# Patient Record
Sex: Female | Born: 1950 | Race: White | Hispanic: No | Smoking: Never smoker
Health system: Southern US, Community
[De-identification: ages and names within clinical notes are randomized; demographics above are authoritative.]

## PROBLEM LIST (undated history)

## (undated) DIAGNOSIS — Z8041 Family history of malignant neoplasm of ovary: Secondary | ICD-10-CM

## (undated) DIAGNOSIS — G4733 Obstructive sleep apnea (adult) (pediatric): Secondary | ICD-10-CM

## (undated) DIAGNOSIS — R0602 Shortness of breath: Secondary | ICD-10-CM

## (undated) DIAGNOSIS — K219 Gastro-esophageal reflux disease without esophagitis: Secondary | ICD-10-CM

## (undated) DIAGNOSIS — R1031 Right lower quadrant pain: Secondary | ICD-10-CM

## (undated) DIAGNOSIS — Z803 Family history of malignant neoplasm of breast: Secondary | ICD-10-CM

## (undated) DIAGNOSIS — F419 Anxiety disorder, unspecified: Secondary | ICD-10-CM

## (undated) DIAGNOSIS — R1032 Left lower quadrant pain: Secondary | ICD-10-CM

## (undated) DIAGNOSIS — E88819 Insulin resistance, unspecified: Secondary | ICD-10-CM

## (undated) DIAGNOSIS — R230 Cyanosis: Secondary | ICD-10-CM

## (undated) DIAGNOSIS — M8580 Other specified disorders of bone density and structure, unspecified site: Secondary | ICD-10-CM

## (undated) DIAGNOSIS — E049 Nontoxic goiter, unspecified: Secondary | ICD-10-CM

## (undated) DIAGNOSIS — R87619 Unspecified abnormal cytological findings in specimens from cervix uteri: Secondary | ICD-10-CM

## (undated) DIAGNOSIS — E039 Hypothyroidism, unspecified: Secondary | ICD-10-CM

## (undated) DIAGNOSIS — Z8249 Family history of ischemic heart disease and other diseases of the circulatory system: Secondary | ICD-10-CM

## (undated) DIAGNOSIS — K760 Fatty (change of) liver, not elsewhere classified: Secondary | ICD-10-CM

## (undated) DIAGNOSIS — A6920 Lyme disease, unspecified: Secondary | ICD-10-CM

## (undated) DIAGNOSIS — R600 Localized edema: Secondary | ICD-10-CM

## (undated) DIAGNOSIS — I7300 Raynaud's syndrome without gangrene: Secondary | ICD-10-CM

## (undated) DIAGNOSIS — N8110 Cystocele, unspecified: Secondary | ICD-10-CM

## (undated) DIAGNOSIS — J45909 Unspecified asthma, uncomplicated: Secondary | ICD-10-CM

## (undated) DIAGNOSIS — I10 Essential (primary) hypertension: Secondary | ICD-10-CM

## (undated) DIAGNOSIS — R0609 Other forms of dyspnea: Secondary | ICD-10-CM

## (undated) DIAGNOSIS — Z8052 Family history of malignant neoplasm of bladder: Secondary | ICD-10-CM

## (undated) DIAGNOSIS — F32A Depression, unspecified: Secondary | ICD-10-CM

## (undated) HISTORY — DX: Fatty (change of) liver, not elsewhere classified: K76.0

## (undated) HISTORY — DX: Raynaud's syndrome without gangrene: I73.00

## (undated) HISTORY — PX: GALLBLADDER SURGERY: SHX652

## (undated) HISTORY — DX: Cyanosis: R23.0

## (undated) HISTORY — DX: Cystocele, unspecified: N81.10

## (undated) HISTORY — DX: Unspecified asthma, uncomplicated: J45.909

## (undated) HISTORY — DX: Unspecified abnormal cytological findings in specimens from cervix uteri: R87.619

## (undated) HISTORY — DX: Other forms of dyspnea: R06.09

## (undated) HISTORY — DX: Localized edema: R60.0

## (undated) HISTORY — DX: Other specified disorders of bone density and structure, unspecified site: M85.80

## (undated) HISTORY — DX: Obstructive sleep apnea (adult) (pediatric): G47.33

## (undated) HISTORY — DX: Essential (primary) hypertension: I10

## (undated) HISTORY — PX: COLONOSCOPY: SHX174

## (undated) HISTORY — PX: KNEE SURGERY: SHX244

## (undated) HISTORY — DX: Shortness of breath: R06.02

## (undated) HISTORY — DX: Hereditary hemochromatosis: E83.110

## (undated) HISTORY — DX: Family history of ischemic heart disease and other diseases of the circulatory system: Z82.49

## (undated) HISTORY — DX: Nontoxic goiter, unspecified: E04.9

## (undated) HISTORY — DX: Lyme disease, unspecified: A69.20

## (undated) HISTORY — DX: Insulin resistance, unspecified: E88.819

## (undated) HISTORY — PX: ESOPHAGOGASTRODUODENOSCOPY: SHX1529

## (undated) HISTORY — DX: Right lower quadrant pain: R10.31

## (undated) HISTORY — DX: Family history of malignant neoplasm of breast: Z80.3

## (undated) HISTORY — DX: Family history of malignant neoplasm of bladder: Z80.52

## (undated) HISTORY — PX: CHOLECYSTECTOMY: SHX55

## (undated) HISTORY — PX: BREAST BIOPSY: SHX20

## (undated) HISTORY — DX: Anxiety disorder, unspecified: F41.9

## (undated) HISTORY — DX: Family history of malignant neoplasm of ovary: Z80.41

## (undated) HISTORY — DX: Left lower quadrant pain: R10.32

## (undated) HISTORY — DX: Depression, unspecified: F32.A

## (undated) HISTORY — PX: CERVICAL CONE BIOPSY: SUR198

---

## 1997-03-25 DIAGNOSIS — R1013 Epigastric pain: Secondary | ICD-10-CM | POA: Insufficient documentation

## 1997-03-25 HISTORY — DX: Epigastric pain: R10.13

## 2008-12-30 DIAGNOSIS — I7300 Raynaud's syndrome without gangrene: Secondary | ICD-10-CM | POA: Insufficient documentation

## 2008-12-30 HISTORY — DX: Raynaud's syndrome without gangrene: I73.00

## 2014-02-24 DIAGNOSIS — N959 Unspecified menopausal and perimenopausal disorder: Secondary | ICD-10-CM | POA: Insufficient documentation

## 2014-02-24 DIAGNOSIS — E039 Hypothyroidism, unspecified: Secondary | ICD-10-CM | POA: Insufficient documentation

## 2014-02-24 DIAGNOSIS — F341 Dysthymic disorder: Secondary | ICD-10-CM | POA: Insufficient documentation

## 2014-02-24 DIAGNOSIS — J302 Other seasonal allergic rhinitis: Secondary | ICD-10-CM

## 2014-02-24 HISTORY — DX: Other seasonal allergic rhinitis: J30.2

## 2014-09-29 DIAGNOSIS — M129 Arthropathy, unspecified: Secondary | ICD-10-CM

## 2014-09-29 HISTORY — DX: Arthropathy, unspecified: M12.9

## 2015-04-12 DIAGNOSIS — R928 Other abnormal and inconclusive findings on diagnostic imaging of breast: Secondary | ICD-10-CM | POA: Insufficient documentation

## 2015-04-12 HISTORY — DX: Other abnormal and inconclusive findings on diagnostic imaging of breast: R92.8

## 2015-08-30 DIAGNOSIS — K760 Fatty (change of) liver, not elsewhere classified: Secondary | ICD-10-CM

## 2015-08-30 HISTORY — DX: Fatty (change of) liver, not elsewhere classified: K76.0

## 2015-08-31 DIAGNOSIS — J4540 Moderate persistent asthma, uncomplicated: Secondary | ICD-10-CM

## 2015-08-31 HISTORY — DX: Moderate persistent asthma, uncomplicated: J45.40

## 2016-04-26 DIAGNOSIS — I10 Essential (primary) hypertension: Secondary | ICD-10-CM

## 2016-04-26 DIAGNOSIS — N135 Crossing vessel and stricture of ureter without hydronephrosis: Secondary | ICD-10-CM

## 2016-04-26 HISTORY — DX: Crossing vessel and stricture of ureter without hydronephrosis: N13.5

## 2016-04-26 HISTORY — DX: Essential (primary) hypertension: I10

## 2016-09-06 LAB — PULMONARY FUNCTION TEST
DLCO: 6.46 ml/mmHg sec
FEV1/FVC: 72 %
FEV1: 2.6 L
FVC: 3.63 L
TLC: 1.74

## 2016-12-04 DIAGNOSIS — K219 Gastro-esophageal reflux disease without esophagitis: Secondary | ICD-10-CM

## 2016-12-04 HISTORY — DX: Gastro-esophageal reflux disease without esophagitis: K21.9

## 2017-03-05 DIAGNOSIS — F411 Generalized anxiety disorder: Secondary | ICD-10-CM | POA: Insufficient documentation

## 2017-03-05 HISTORY — DX: Generalized anxiety disorder: F41.1

## 2017-09-11 DIAGNOSIS — M8580 Other specified disorders of bone density and structure, unspecified site: Secondary | ICD-10-CM | POA: Insufficient documentation

## 2018-01-02 HISTORY — PX: HYSTEROTOMY: SHX1776

## 2018-01-02 HISTORY — PX: LAPAROSCOPIC HYSTERECTOMY: SHX1926

## 2018-09-13 ENCOUNTER — Other Ambulatory Visit: Payer: Self-pay

## 2018-09-16 ENCOUNTER — Other Ambulatory Visit: Payer: Self-pay

## 2018-09-16 ENCOUNTER — Ambulatory Visit: Payer: Medicare PPO | Admitting: Obstetrics & Gynecology

## 2018-09-16 ENCOUNTER — Encounter: Payer: Self-pay | Admitting: Obstetrics & Gynecology

## 2018-09-16 VITALS — BP 132/90 | Ht 67.0 in | Wt 158.8 lb

## 2018-09-16 DIAGNOSIS — Z780 Asymptomatic menopausal state: Secondary | ICD-10-CM

## 2018-09-16 DIAGNOSIS — Z90710 Acquired absence of both cervix and uterus: Secondary | ICD-10-CM

## 2018-09-16 DIAGNOSIS — N6320 Unspecified lump in the left breast, unspecified quadrant: Secondary | ICD-10-CM

## 2018-09-16 DIAGNOSIS — Z01419 Encounter for gynecological examination (general) (routine) without abnormal findings: Secondary | ICD-10-CM | POA: Diagnosis not present

## 2018-09-16 DIAGNOSIS — M8589 Other specified disorders of bone density and structure, multiple sites: Secondary | ICD-10-CM | POA: Diagnosis not present

## 2018-09-16 DIAGNOSIS — N8110 Cystocele, unspecified: Secondary | ICD-10-CM | POA: Diagnosis not present

## 2018-09-16 DIAGNOSIS — Z23 Encounter for immunization: Secondary | ICD-10-CM

## 2018-09-16 NOTE — Progress Notes (Signed)
Yolanda Vargas 1950/12/31 [id number]   History:    68 y.o. G3P3L3  RP:  New  patient presenting for annual gyn exam   HPI: STATUS post total hysterectomy.  Menopause, well on no hormone replacement therapy.  No pelvic pain.  Currently abstinent.  Urine and bowel movements normal.  Feels a bulge in the vagina when standing and walking.  No vaginal pain.  Breasts normal.  Body mass index normal at 24.87.  Health labs with family physician.  Past medical history,surgical history, family history and social history were all reviewed and documented in the EPIC chart.  Gynecologic History No LMP recorded. Patient has had a hysterectomy. Contraception: status post hysterectomy Last Pap: 09/2017. Results were: normal per patient Last mammogram: 09/2017. Results were: Normal Bone Density: 09/2017 Osteopenia  Colonoscopy: 5 yrs ago normal, 10 yr schedule  Obstetric History OB History  Gravida Para Term Preterm AB Living  3 3 3     3   SAB TAB Ectopic Multiple Live Births               # Outcome Date GA Lbr Len/2nd Weight Sex Delivery Anes PTL Lv  3 Term           2 Term           1 Term              ROS: A ROS was performed and pertinent positives and negatives are included in the history.  GENERAL: No fevers or chills. HEENT: No change in vision, no earache, sore throat or sinus congestion. NECK: No pain or stiffness. CARDIOVASCULAR: No chest pain or pressure. No palpitations. PULMONARY: No shortness of breath, cough or wheeze. GASTROINTESTINAL: No abdominal pain, nausea, vomiting or diarrhea, melena or bright red blood per rectum. GENITOURINARY: No urinary frequency, urgency, hesitancy or dysuria. MUSCULOSKELETAL: No joint or muscle pain, no back pain, no recent trauma. DERMATOLOGIC: No rash, no itching, no lesions. ENDOCRINE: No polyuria, polydipsia, no heat or cold intolerance. No recent change in weight. HEMATOLOGICAL: No anemia or easy bruising or bleeding. NEUROLOGIC: No headache,  seizures, numbness, tingling or weakness. PSYCHIATRIC: No depression, no loss of interest in normal activity or change in sleep pattern.     Exam:   BP 132/90    Ht 5\' 7"  (1.702 m)    Wt 158 lb 12.8 oz (72 kg)    BMI 24.87 kg/m   Body mass index is 24.87 kg/m.  General appearance : Well developed well nourished female. No acute distress HEENT: Eyes: no retinal hemorrhage or exudates,  Neck supple, trachea midline, no carotid bruits, no thyroidmegaly Lungs: Clear to auscultation, no rhonchi or wheezes, or rib retractions  Heart: Regular rate and rhythm, no murmurs or gallops Breast:Examined in sitting and supine position were symmetrical in appearance.  Right breast: no palpable masses or tenderness,  no skin retraction, no nipple inversion, no nipple discharge, no skin discoloration, no right axillary or supraclavicular lymphadenopathy.  Left breast:  Palpable nodule at 7-8 O'Clock close to the nipple, 1 x 2 cm, soft, mobile, mildly tender, no skin retraction, no nipple inversion, no nipple discharge, no skin discoloration, no left axillary or supraclavicular lymphadenopathy.   Abdomen: no palpable masses or tenderness, no rebound or guarding Extremities: no edema or skin discoloration or tenderness  Pelvic: Vulva: Normal             Vagina: No gross lesions or discharge.  Cystocele grade 3/4 with Valsalva.  Cervix/Uterus absent  Adnexa  Without masses or tenderness  Anus: Normal   Assessment/Plan:  68 y.o. female for annual exam   1. Well female exam with routine gynecological exam Gynecologic exam status post total hysterectomy and menopause.  Pap test September 2019 was normal per patient, no indication to repeat this year.  Breast exam with a left breast nodule.  Will schedule a left diagnostic mammogram and ultrasound.  Right screening mammogram at the same time.  Health labs with family physician.  Good body mass index at 24.87.  Continue with healthy nutrition and fitness.  2.  S/P total hysterectomy  3. Postmenopause Well on no hormone replacement therapy.  4. Osteopenia of multiple sites Last bone density September 2019 showed osteopenia.  Continue with vitamin D supplements, calcium intake of 1200 mg daily and regular weightbearing physical activities.  5. Left breast lump Left breast tender nodule at 7-8 O'Clock, near the nipple. 1 x 2 cm, soft, mobile.  Will refer for a Left Dx Mammo/US.   6. Baden-Walker grade 3 cystocele Cystocele grade 3/4.  Precautions reviewed with patient to minimize symptoms and avoid progression, especially avoiding excessive pressure by emptying her bladder before it is too full, avoiding pushing for bowel movements, treating cough, and avoiding heavy lifting.  Pessary management and surgery reviewed.  Patient will observe at this time.  We will follow-up for pessary fitting if symptoms progress.  7. Need for immunization against influenza - Flu Vaccine QUAD 36+ mos IM  Other orders - amLODipine (NORVASC) 5 MG tablet - SYMBICORT 160-4.5 MCG/ACT inhaler - escitalopram (LEXAPRO) 10 MG tablet - meloxicam (MOBIC) 15 MG tablet - montelukast (SINGULAIR) 10 MG tablet - rosuvastatin (CRESTOR) 10 MG tablet  Counseling on above issues and coordination of care more than 50% for 30 minutes.  Genia DelMarie-Lyne Sydnei Ohaver MD, 3:35 PM 09/16/2018

## 2018-09-18 ENCOUNTER — Telehealth: Payer: Self-pay | Admitting: *Deleted

## 2018-09-18 DIAGNOSIS — N6320 Unspecified lump in the left breast, unspecified quadrant: Secondary | ICD-10-CM

## 2018-09-18 NOTE — Telephone Encounter (Signed)
Orders placed at [facility] was told that patient will need to have films transferred to breast center before they can schedule imaging. I advised patient she can to on breast center website and fill out medical release form for imaging to be release from previous out of state imaging center. Patient verbalized she understood.

## 2018-09-18 NOTE — Telephone Encounter (Signed)
-----   Message from Princess Bruins, MD sent at 09/16/2018  4:01 PM EDT ----- Regarding: Refer to the [place] Left breast tender nodule at 7-8 O'Clock, near the nipple.  1 x 2 cm, soft, mobile.  Left Dx mammo/US and Right Screening mammo.

## 2018-09-22 ENCOUNTER — Encounter: Payer: Self-pay | Admitting: Obstetrics & Gynecology

## 2018-09-22 NOTE — Patient Instructions (Signed)
1. Well female exam with routine gynecological exam Gynecologic exam status post total hysterectomy and menopause.  Pap test September 2019 was normal per patient, no indication to repeat this year.  Breast exam with a left breast nodule.  Will schedule a left diagnostic mammogram and ultrasound.  Right screening mammogram at the same time.  Health labs with family physician.  Good body mass index at 24.87.  Continue with healthy nutrition and fitness.  2. S/P total hysterectomy  3. Postmenopause Well on no hormone replacement therapy.  4. Osteopenia of multiple sites Last bone density September 2019 showed osteopenia.  Continue with vitamin D supplements, calcium intake of 1200 mg daily and regular weightbearing physical activities.  5. Left breast lump Left breast tender nodule at 7-8 O'Clock, near the nipple. 1 x 2 cm, soft, mobile.  Will refer for a Left Dx Mammo/US.   6. Baden-Walker grade 3 cystocele Cystocele grade 3/4.  Precautions reviewed with patient to minimize symptoms and avoid progression, especially avoiding excessive pressure by emptying her bladder before it is too full, avoiding pushing for bowel movements, treating cough, and avoiding heavy lifting.  Pessary management and surgery reviewed.  Patient will observe at this time.  We will follow-up for pessary fitting if symptoms progress.  7. Need for immunization against influenza - Flu Vaccine QUAD 36+ mos IM  Desiraye, it was a pleasure meeting you today!

## 2018-09-26 NOTE — Telephone Encounter (Signed)
Patient scheduled on 09/30/18 @ 9:40am

## 2018-09-30 ENCOUNTER — Other Ambulatory Visit: Payer: Self-pay

## 2018-09-30 ENCOUNTER — Ambulatory Visit
Admission: RE | Admit: 2018-09-30 | Discharge: 2018-09-30 | Disposition: A | Discharge status: Home / Self Care | Payer: Medicare PPO | Source: Ambulatory Visit | Attending: Obstetrics & Gynecology | Admitting: Obstetrics & Gynecology

## 2018-09-30 DIAGNOSIS — N6320 Unspecified lump in the left breast, unspecified quadrant: Secondary | ICD-10-CM

## 2018-10-08 ENCOUNTER — Ambulatory Visit: Payer: Medicare PPO | Admitting: Obstetrics & Gynecology

## 2019-07-08 ENCOUNTER — Ambulatory Visit: Payer: Medicare PPO | Admitting: Orthopaedic Surgery

## 2019-07-25 ENCOUNTER — Ambulatory Visit: Payer: Medicare PPO | Admitting: Orthopaedic Surgery

## 2019-08-01 ENCOUNTER — Institutional Professional Consult (permissible substitution): Payer: Medicare PPO | Admitting: Emergency Medicine

## 2019-08-26 ENCOUNTER — Encounter: Payer: Self-pay | Admitting: Orthopaedic Surgery

## 2019-08-26 ENCOUNTER — Ambulatory Visit (INDEPENDENT_AMBULATORY_CARE_PROVIDER_SITE_OTHER): Payer: Medicare PPO

## 2019-08-26 ENCOUNTER — Ambulatory Visit: Payer: Medicare PPO | Admitting: Orthopaedic Surgery

## 2019-08-26 VITALS — Ht 67.5 in | Wt 155.6 lb

## 2019-08-26 DIAGNOSIS — M25552 Pain in left hip: Secondary | ICD-10-CM | POA: Diagnosis not present

## 2019-08-26 NOTE — Progress Notes (Signed)
Office Visit Note   Patient: Yolanda Vargas           Date of Birth: 10/08/1950           MRN: [id number] Visit Date: 08/26/2019              Requested by: Wilfrid Lund, PA [address] [place],  [place] [address] PCP: Patient, No Pcp Per   Assessment & Plan: Visit Diagnoses:  1. Pain in left hip     Plan: Impression is chronic left hip/left lower extremity radiculopathy which is intermittent in nature.  We have discussed possible treatment options.  For now, she will continue with yoga and start lumbar exercises.  I believe that if she does have a flareup she would benefit from a cortisone injection to the left hip joint.  She will follow up with [place] if her symptoms flareup and become persistent.  Follow-Up Instructions: Return if symptoms worsen or fail to improve.   Orders:  Orders Placed This Encounter  Procedures  . XR Pelvis 1-2 Views  . XR Lumbar Spine 2-3 Views   No orders of the defined types were placed in this encounter.     Procedures: No procedures performed   Clinical Data: No additional findings.   Subjective: Chief Complaint  Patient presents with  . Left Hip - Pain  . Lower Back - Pain    HPI patient is a very pleasant and active 69 year old female who comes in today for evaluation of chronic low back/left hip pain.  This is been ongoing for several years.  She notes the pain is deep within her left hip but is unable to pinpoint whether this is coming from the groin or buttocks area.  She does note a remote history of what sounds like cortisone injections to the trochanteric bursa.  The pain she has is primarily with walking as well as occasionally when she is driving for which she gets a twinge.  She denies any radiating pain down the leg, however she does have a history of sciatica about a year ago.  This has resolved.  No numbness, tingling or burning.  No bowel or bladder change and no saddle paresthesias.  Review of Systems as detailed in  HPI.  All others reviewed and are negative.   Objective: Vital Signs: Ht 5' 7.5" (1.715 m)   Wt 155 lb 9.6 oz (70.6 kg)   BMI 24.01 kg/m   Physical Exam well-developed well-nourished female no acute distress.  Alert and oriented x3.  Ortho Exam examination of her left hip reveals a positive logroll and positive FADIR.  Minimally positive straight leg raise.  No focal weakness.  She is neurovascular intact distally.  Specialty Comments:  No specialty comments available.  Imaging: XR Lumbar Spine 2-3 Views  Result Date: 08/26/2019 Marked degenerative changes L5-S1  XR Pelvis 1-2 Views  Result Date: 08/26/2019 Mild to moderate narrowing left hip    PMFS History: There are no problems to display for this patient.  Past Medical History:  Diagnosis Date  . Asthma   . Hypertension   . Thyroid goiter     Family History  Problem Relation Age of Onset  . Breast cancer Mother 21  . Diabetes Father   . Cancer Sister        OVARIAN  . Heart disease Brother     Past Surgical History:  Procedure Laterality Date  . BREAST BIOPSY Left   . GALLBLADDER SURGERY    .  HYSTEROTOMY  2020   Social History   Occupational History  . Not on file  Tobacco Use  . Smoking status: Never Smoker  . Smokeless tobacco: Never Used  Vaping Use  . Vaping Use: Never used  Substance and Sexual Activity  . Alcohol use: Not Currently  . Drug use: Never  . Sexual activity: Not Currently    Partners: Male    Comment: first sexual encounter at 76 one partner

## 2019-09-17 ENCOUNTER — Institutional Professional Consult (permissible substitution): Payer: Medicare PPO | Admitting: Emergency Medicine

## 2019-10-07 ENCOUNTER — Ambulatory Visit (INDEPENDENT_AMBULATORY_CARE_PROVIDER_SITE_OTHER): Payer: Medicare PPO

## 2019-10-07 ENCOUNTER — Ambulatory Visit: Payer: Medicare PPO | Admitting: Pulmonary Disease

## 2019-10-07 ENCOUNTER — Encounter: Payer: Self-pay | Admitting: Pulmonary Disease

## 2019-10-07 ENCOUNTER — Other Ambulatory Visit: Payer: Self-pay

## 2019-10-07 VITALS — BP 124/64 | HR 67 | Temp 96.7°F | Ht 67.0 in | Wt 154.0 lb

## 2019-10-07 DIAGNOSIS — R0602 Shortness of breath: Secondary | ICD-10-CM

## 2019-10-07 DIAGNOSIS — J4540 Moderate persistent asthma, uncomplicated: Secondary | ICD-10-CM | POA: Diagnosis not present

## 2019-10-07 LAB — CBC WITH DIFFERENTIAL/PLATELET
Basophils Absolute: 0.1 10*3/uL (ref 0.0–0.1)
Basophils Relative: 1 % (ref 0.0–3.0)
Eosinophils Absolute: 0.3 10*3/uL (ref 0.0–0.7)
Eosinophils Relative: 4.4 % (ref 0.0–5.0)
HCT: 44.2 % (ref 36.0–46.0)
Hemoglobin: 14.8 g/dL (ref 12.0–15.0)
Lymphocytes Relative: 24.5 % (ref 12.0–46.0)
Lymphs Abs: 1.7 10*3/uL (ref 0.7–4.0)
MCHC: 33.5 g/dL (ref 30.0–36.0)
MCV: 100.8 fl — ABNORMAL HIGH (ref 78.0–100.0)
Monocytes Absolute: 0.5 10*3/uL (ref 0.1–1.0)
Monocytes Relative: 7.4 % (ref 3.0–12.0)
Neutro Abs: 4.3 10*3/uL (ref 1.4–7.7)
Neutrophils Relative %: 62.7 % (ref 43.0–77.0)
Platelets: 202 10*3/uL (ref 150.0–400.0)
RBC: 4.38 Mil/uL (ref 3.87–5.11)
RDW: 12.9 % (ref 11.5–15.5)
WBC: 6.9 10*3/uL (ref 4.0–10.5)

## 2019-10-07 NOTE — Patient Instructions (Signed)
We will get records from her previous pulmonologist Dr Regenia Skeeter, [facility] Check CBC with differential, IgE Chest x-ray today, schedule pulmonary function test Continue current inhaler medication and Singulair and Allegra  Follow-up in 2 to 3 months.

## 2019-10-07 NOTE — Progress Notes (Addendum)
Yolanda Vargas    [id number]    Nov 29, 1950  Primary Care Physician:Becker, Ann Maki, PA  Referring Physician: Wilfrid Lund, PA [address] [place],  [place] [address]  Chief complaint: Consult for asthma  HPI: 69 year old with history of allergies, asthma, hyperlipidemia, hypertension Here to establish care for management of asthma  Previously followed by Dr Regenia Skeeter, [facility] She was diagnosed with asthma around 2012 after a methacholine challenge test.  Maintained on Symbicort, Singulair, Allegra and Flonase nasal spray Still has some persistent symptoms but says that she has learned to live with it Does not use her rescue inhaler regularly  Pets: Has a dog Occupation: Retired Engineer, site and principal Exposures: No mold, hot tub, Jacuzzi.  No feather pillows or comforters Smoking history: Never smoker Travel history: Recently moved from [place] Relevant family history: No significant family history of lung disease   Outpatient Encounter Medications as of 10/07/2019  Medication Sig  . amLODipine (NORVASC) 5 MG tablet   . escitalopram (LEXAPRO) 10 MG tablet   . meloxicam (MOBIC) 15 MG tablet As needed  . montelukast (SINGULAIR) 10 MG tablet   . rosuvastatin (CRESTOR) 10 MG tablet   . SYMBICORT 160-4.5 MCG/ACT inhaler    No facility-administered encounter medications on file as of 10/07/2019.    Allergies as of 10/07/2019 - Review Complete 10/07/2019  Allergen Reaction Noted  . Diltiazem hcl Other (See Comments) 11/26/2018  . Penicillins Rash 11/26/2018  . Cardizem [diltiazem]  08/26/2019  . Doxycycline Rash 11/26/2018  . Hydrocodone Palpitations 11/26/2018  . Lisinopril Cough and Other (See Comments) 11/26/2018    Past Medical History:  Diagnosis Date  . Asthma   . Hypertension   . Thyroid goiter     Past Surgical History:  Procedure Laterality Date  . BREAST BIOPSY Left   . GALLBLADDER SURGERY    .  HYSTEROTOMY  2020    Family History  Problem Relation Age of Onset  . Breast cancer Mother 69  . Diabetes Father   . Cancer Sister        OVARIAN  . Heart disease Brother     Social History   Socioeconomic History  . Marital status: Divorced    Spouse name: Not on file  . Number of children: Not on file  . Years of education: Not on file  . Highest education level: Not on file  Occupational History  . Not on file  Tobacco Use  . Smoking status: Never Smoker  . Smokeless tobacco: Never Used  Vaping Use  . Vaping Use: Never used  Substance and Sexual Activity  . Alcohol use: Not Currently  . Drug use: Never  . Sexual activity: Not Currently    Partners: Male    Comment: first sexual encounter at 62 one partner  Other Topics Concern  . Not on file  Social History Narrative  . Not on file   Social Determinants of Health   Financial Resource Strain:   . Difficulty of Paying Living Expenses: Not on file  Food Insecurity:   . Worried About Programme researcher, broadcasting/film/video in the Last Year: Not on file  . Ran Out of Food in the Last Year: Not on file  Transportation Needs:   . Lack of Transportation (Medical): Not on file  . Lack of Transportation (Non-Medical): Not on file  Physical Activity:   . Days of Exercise per Week: Not on  file  . Minutes of Exercise per Session: Not on file  Stress:   . Feeling of Stress : Not on file  Social Connections:   . Frequency of Communication with Friends and Family: Not on file  . Frequency of Social Gatherings with Friends and Family: Not on file  . Attends Religious Services: Not on file  . Active Member of Clubs or Organizations: Not on file  . Attends Banker Meetings: Not on file  . Marital Status: Not on file  Intimate Partner Violence:   . Fear of Current or Ex-Partner: Not on file  . Emotionally Abused: Not on file  . Physically Abused: Not on file  . Sexually Abused: Not on file    Review of systems: Review of  Systems  Constitutional: Negative for fever and chills.  HENT: Negative.   Eyes: Negative for blurred vision.  Respiratory: as per HPI  Cardiovascular: Negative for chest pain and palpitations.  Gastrointestinal: Negative for vomiting, diarrhea, blood per rectum. Genitourinary: Negative for dysuria, urgency, frequency and hematuria.  Musculoskeletal: Negative for myalgias, back pain and joint pain.  Skin: Negative for itching and rash.  Neurological: Negative for dizziness, tremors, focal weakness, seizures and loss of consciousness.  Endo/Heme/Allergies: Negative for environmental allergies.  Psychiatric/Behavioral: Negative for depression, suicidal ideas and hallucinations.  All other systems reviewed and are negative.  Physical Exam: Blood pressure 124/64, pulse 67, temperature (!) 96.7 F (35.9 C), temperature source Skin, height 5\' 7"  (1.702 m), weight 154 lb (69.9 kg), SpO2 99 %. Gen:      No acute distress HEENT:  EOMI, sclera anicteric Neck:     No masses; no thyromegaly Lungs:    Clear to auscultation bilaterally; normal respiratory effort CV:         Regular rate and rhythm; no murmurs Abd:      + bowel sounds; soft, non-tender; no palpable masses, no distension Ext:    No edema; adequate peripheral perfusion Skin:      Warm and dry; no rash Neuro: alert and oriented x 3 Psych: normal mood and affect  Data Reviewed: Imaging:  PFTs:  ACT score 10/07/2019-16  Labs: CBC 06/20/2019-WBC 6.1, eos 2.3%, absolute eosinophil count 140  Assessment:  Asthma Check baseline CBC, IgE, chest x-ray and PFTs as we do not have these on record Continue current regimen with Symbicort, Singulair and Allegra, Flonase  May need escalation to biologics based on review of above tests.  Plan/Recommendations: CBC, IgE, chest x-ray, PFTs  06/22/2019 MD [facility] 10/07/2019, 11:06 AM  CC: 12/07/2019, PA  Addendum: Received note from Dr. Wilfrid Lund,  [facility] visit dated September 10, 2017 Patient has severe persistent asthma maintained on ProAir, Singulair and Symbicort 160/4.5  PFTs 09/06/2016 FVC 3.63 [102%], FEV1 2.60 [95%], F/F 72, TLC 5.21 [91%], DLCO 24.92 [73%] Minimal obstruction with bronchodilator response, mild reduction in diffusion capacity.  Normal lung volume.  11/06/2016 MD [facility] Please see [email or web address] for pager details.  10/29/2019, 3:46 PM

## 2019-10-08 ENCOUNTER — Telehealth: Payer: Self-pay | Admitting: Pulmonary Disease

## 2019-10-08 ENCOUNTER — Encounter: Payer: Self-pay | Admitting: Pulmonary Disease

## 2019-10-08 LAB — IGE: IgE (Immunoglobulin E), Serum: 24 kU/L (ref <=114)

## 2019-10-08 NOTE — Telephone Encounter (Signed)
Called and spoke with patient and confirmed medication that needs to be adde to her med list and dosage. It has been added to her med list. She also states that when she goes into her mychart to look at the images of her x-ray it is taking her to Medline- pediatric leukemia website. Advised her to see if there was a customer service number or IT number to see if they can assist her. Nothing further needed at this time.

## 2019-10-16 ENCOUNTER — Encounter: Payer: Medicare PPO | Admitting: Obstetrics & Gynecology

## 2019-11-11 ENCOUNTER — Other Ambulatory Visit: Payer: Self-pay | Admitting: Obstetrics & Gynecology

## 2019-11-11 DIAGNOSIS — Z1231 Encounter for screening mammogram for malignant neoplasm of breast: Secondary | ICD-10-CM

## 2019-11-20 ENCOUNTER — Encounter: Payer: Medicare PPO | Admitting: Obstetrics & Gynecology

## 2019-12-03 ENCOUNTER — Ambulatory Visit: Payer: Medicare PPO | Admitting: Pulmonary Disease

## 2019-12-08 ENCOUNTER — Ambulatory Visit (INDEPENDENT_AMBULATORY_CARE_PROVIDER_SITE_OTHER): Payer: Medicare PPO | Admitting: Pulmonary Disease

## 2019-12-08 ENCOUNTER — Encounter: Payer: Self-pay | Admitting: Pulmonary Disease

## 2019-12-08 ENCOUNTER — Other Ambulatory Visit: Payer: Self-pay

## 2019-12-08 VITALS — BP 130/70 | HR 74 | Temp 97.7°F | Ht 67.5 in | Wt 158.6 lb

## 2019-12-08 DIAGNOSIS — R0602 Shortness of breath: Secondary | ICD-10-CM

## 2019-12-08 DIAGNOSIS — J4550 Severe persistent asthma, uncomplicated: Secondary | ICD-10-CM

## 2019-12-08 LAB — PULMONARY FUNCTION TEST
DL/VA % pred: 101 %
DL/VA: 4.11 ml/min/mmHg/L
DLCO cor % pred: 103 %
DLCO cor: 22.75 ml/min/mmHg
DLCO unc % pred: 103 %
DLCO unc: 22.75 ml/min/mmHg
FEF 25-75 Post: 2.31 L/sec
FEF 25-75 Pre: 1.97 L/sec
FEF2575-%Change-Post: 17 %
FEF2575-%Pred-Post: 107 %
FEF2575-%Pred-Pre: 91 %
FEV1-%Change-Post: 3 %
FEV1-%Pred-Post: 100 %
FEV1-%Pred-Pre: 97 %
FEV1-Post: 2.66 L
FEV1-Pre: 2.56 L
FEV1FVC-%Change-Post: 1 %
FEV1FVC-%Pred-Pre: 98 %
FEV6-%Change-Post: 2 %
FEV6-%Pred-Post: 104 %
FEV6-%Pred-Pre: 101 %
FEV6-Post: 3.48 L
FEV6-Pre: 3.38 L
FEV6FVC-%Change-Post: 0 %
FEV6FVC-%Pred-Post: 104 %
FEV6FVC-%Pred-Pre: 103 %
FVC-%Change-Post: 2 %
FVC-%Pred-Post: 100 %
FVC-%Pred-Pre: 98 %
FVC-Post: 3.48 L
FVC-Pre: 3.4 L
Post FEV1/FVC ratio: 77 %
Post FEV6/FVC ratio: 100 %
Pre FEV1/FVC ratio: 75 %
Pre FEV6/FVC Ratio: 99 %
RV % pred: 108 %
RV: 2.55 L
TLC % pred: 106 %
TLC: 5.95 L

## 2019-12-08 NOTE — Progress Notes (Signed)
         Yolanda Vargas    [id number]    03/25/1950  Primary Care Physician:Becker, Ann Maki, PA  Referring Physician: Wilfrid Lund, PA [address] [place],  [place] [address]  Chief complaint: Follow-up for severe persistent asthma  HPI: 69 year old with history of allergies, asthma, hyperlipidemia, hypertension Here to establish care for management of asthma  Previously followed by Dr Regenia Skeeter, [facility] She was diagnosed with asthma around 2012 after a methacholine challenge test.  Maintained on Symbicort, Singulair, Allegra and Flonase nasal spray Still has some persistent symptoms but says that she has learned to live with it Does not use her rescue inhaler regularly  Pets: Has a dog Occupation: Retired Engineer, site and principal Exposures: No mold, hot tub, Jacuzzi.  No feather pillows or comforters Smoking history: Never smoker Travel history: Recently moved from [place] Relevant family history: No significant family history of lung disease  Interim history: Continues to have significant burden of disease with daily symptoms and poor control  Received note from Dr. Mellody Dance, [facility] visit dated September 10, 2017 Patient has severe persistent asthma maintained on ProAir, Singulair and Symbicort 160/4.5    Outpatient Encounter Medications as of 12/08/2019  Medication Sig  . amLODipine (NORVASC) 5 MG tablet   . escitalopram (LEXAPRO) 10 MG tablet   . levothyroxine (SYNTHROID) 88 MCG tablet Take 88 mcg by mouth daily.  . meloxicam (MOBIC) 15 MG tablet As needed  . montelukast (SINGULAIR) 10 MG tablet   . rosuvastatin (CRESTOR) 10 MG tablet   . SYMBICORT 160-4.5 MCG/ACT inhaler    No facility-administered encounter medications on file as of 12/08/2019.   Physical Exam: Blood pressure 124/64, pulse 67, temperature (!) 96.7 F (35.9 C), temperature source Skin, height 5\' 7"  (1.702 m), weight 154 lb  (69.9 kg), SpO2 99 %. Gen:      No acute distress HEENT:  EOMI, sclera anicteric Neck:     No masses; no thyromegaly Lungs:    Clear to auscultation bilaterally; normal respiratory effort CV:         Regular rate and rhythm; no murmurs Abd:      + bowel sounds; soft, non-tender; no palpable masses, no distension Ext:    No edema; adequate peripheral perfusion Skin:      Warm and dry; no rash Neuro: alert and oriented x 3 Psych: normal mood and affect  Data Reviewed: Imaging:  PFTs: PFTs 09/06/2016 FVC 3.63 [102%], FEV1 2.60 [95%], F/F 72, TLC 5.21 [91%], DLCO 24.92 [73%] Minimal obstruction with bronchodilator response, mild reduction in diffusion capacity.  Normal lung volume.  12/08/19 FVC 3.48 [100%], FEV1 2.66 [100%], F/F 77, TLC 5.95 [96%], DLCO 22.75 [93%] Normal test  ACT score 10/07/2019-16 ACT score 12/08/2019-18  Labs: CBC 06/20/2019-WBC 6.1, eos 2.3%, absolute eosinophil count 140 CBC 10/07/2019-WBC 6.9, eos 4.4%, absolute eosinophil count 304  IgE 10/07/2019-24  Assessment:  Severe persistent asthma Continue current regimen with Symbicort, Singulair and Allegra, Flonase Though PFTs today are normal she continues to have severe symptoms with poor control  Start paperwork for 12/07/2019  Plan/Recommendations: Continue Symbicort, Singulair and [employer], Flonase Start paperwork for Careers adviser MD [facility] 12/08/2019, 10:26 AM  CC: 14/06/2019, PA

## 2019-12-08 NOTE — Progress Notes (Signed)
PFT done today. 

## 2019-12-08 NOTE — Patient Instructions (Signed)
I have reviewed your pulmonary function test today which shows minimal changes secondary to asthma Given persistent and severe symptoms we will start you on an injection therapy called Fasenra. Continue your inhalers and Singulair  Follow-up in 3 months.

## 2019-12-09 ENCOUNTER — Telehealth: Payer: Self-pay | Admitting: Pharmacy Technician

## 2019-12-09 NOTE — Telephone Encounter (Signed)
Faxed Fasenra patient [place] to [place], awaiting response.  Phone# [phone]

## 2019-12-09 NOTE — Telephone Encounter (Signed)
Received New start paperwork for FASENRA. Will update as we work through the benefits process. °

## 2019-12-09 NOTE — Telephone Encounter (Signed)
Submitted a Prior Authorization request to [employer] for [facility] PEN via Cover My Meds. Will update once we receive a response.   KeyLucita Ferrara - PA Case ID: [id number]

## 2019-12-10 NOTE — Telephone Encounter (Signed)
Received notification from [facility] regarding a prior authorization for [facility] PEN. Authorization has been APPROVED from 01/03/19 to 01/01/21.   Authorization # [id number]  Ran test claim, patient's copay for 1st dose is $100. Still awaiting response from [place] patient assistance.

## 2019-12-18 NOTE — Telephone Encounter (Signed)
Approved. Awaiting approval letter via fax.

## 2019-12-19 ENCOUNTER — Telehealth: Payer: Self-pay | Admitting: Pulmonary Disease

## 2019-12-19 MED ORDER — EPINEPHRINE 0.3 MG/0.3ML IJ SOAJ: 0.3000 mg | Freq: Once | INTRAMUSCULAR | 11 refills | Status: AC

## 2019-12-19 NOTE — Telephone Encounter (Signed)
Called and spoke with Patient. Patient scheduled 01/22/20 at 0900 for first Fasenra injection.  Office protocol reviewed with Patient.  Understanding stated. Epipen prescription sent to requested [facility].  Nothing further at this time.

## 2019-12-23 ENCOUNTER — Ambulatory Visit: Payer: Medicare PPO

## 2019-12-23 NOTE — Telephone Encounter (Signed)
2nd request from [place]....awaiting approval letter.

## 2019-12-23 NOTE — Telephone Encounter (Signed)
Received a fax from  [place] regarding an approval for [facility] PEN patient assistance from 12/16/19 to 01/02/20.   Program will send information to patient on how to re-enroll over the phone or online.  Phone number: [phone]   Spoke to patient, she states she received her 1st shipment a couple days ago. She has scheduled her [employer] training with clinic. Advised patient that she should receive a letter form [place] [employer] [place] on how to re-enroll over the phone or online for 2022.  She will contact office, if she does not receive letter.  [place] [employer] [place] re--enrollment # [id number], Monday through Friday, 9:00 a.m. - 6:00 p.m. EST

## 2019-12-24 ENCOUNTER — Encounter: Payer: Medicare PPO | Admitting: Obstetrics & Gynecology

## 2020-01-14 ENCOUNTER — Encounter: Payer: Medicare PPO | Admitting: Obstetrics & Gynecology

## 2020-01-22 ENCOUNTER — Ambulatory Visit: Payer: Medicare PPO

## 2020-01-22 ENCOUNTER — Telehealth: Payer: Self-pay | Admitting: Pulmonary Disease

## 2020-01-22 NOTE — Telephone Encounter (Signed)
Called and spoke with Patient.  Patient rescheduled Fasenra injection 04/15/20 at 0930. Patient has Harrington Challenger and is aware of office policy.  Patient stated she has Epipen and understands to bring to injection appointment.

## 2020-01-28 ENCOUNTER — Other Ambulatory Visit: Payer: Self-pay

## 2020-01-28 ENCOUNTER — Ambulatory Visit
Admission: RE | Admit: 2020-01-28 | Discharge: 2020-01-28 | Disposition: A | Discharge status: Home / Self Care | Payer: Medicare PPO | Source: Ambulatory Visit | Attending: Obstetrics & Gynecology | Admitting: Obstetrics & Gynecology

## 2020-01-28 DIAGNOSIS — Z1231 Encounter for screening mammogram for malignant neoplasm of breast: Secondary | ICD-10-CM

## 2020-02-16 ENCOUNTER — Ambulatory Visit: Payer: Medicare PPO

## 2020-02-17 ENCOUNTER — Telehealth: Payer: Self-pay | Admitting: Pulmonary Disease

## 2020-02-17 NOTE — Telephone Encounter (Signed)
Will forward this message to Misty Stanley to get the pt rescheduled for her fasenra injection.

## 2020-02-19 NOTE — Telephone Encounter (Signed)
Spoke with Patient.  Patient scheduled first Fasenra injection 02/23/20 at 0930.  Patient aware to bring epipen to appointment. Nothing further at this time.

## 2020-02-23 ENCOUNTER — Other Ambulatory Visit: Payer: Self-pay

## 2020-02-23 ENCOUNTER — Ambulatory Visit (INDEPENDENT_AMBULATORY_CARE_PROVIDER_SITE_OTHER): Payer: Medicare PPO

## 2020-02-23 DIAGNOSIS — J4550 Severe persistent asthma, uncomplicated: Secondary | ICD-10-CM | POA: Diagnosis not present

## 2020-02-23 MED ORDER — BENRALIZUMAB 30 MG/ML ~~LOC~~ SOSY
30.0000 mg | PREFILLED_SYRINGE | Freq: Once | SUBCUTANEOUS | Status: AC
  Administered 2020-02-23: 30 mg via SUBCUTANEOUS

## 2020-02-23 NOTE — Progress Notes (Signed)
Patient presented to the office today for first-time Fasenra injection.  Primary Pulmonologist: Chilton Greathouse MD Medication name: Harrington Challenger Strength: 30mg  Site(s): Left arm  Epi pen/Auvi-Q visible during appointment: Yes  Time of injection: 0959  Patient evaluated every 15-20 minutes per protocol.  1st check: 1014 Evaluation: patient sitting in chair. No signs of redness or swelling at injection site. Patient denies problems.  2nd check: 1029 Evaluation: Patient denies any problems. No signs of redness or swelling at left arm injection site.  Patient instructed on Epipen with demo pen.  Patient instructed on self/home injections. Patient administered Fasenra pen in left upper arm.

## 2020-03-09 ENCOUNTER — Ambulatory Visit: Payer: Medicare PPO | Admitting: Pulmonary Disease

## 2020-03-16 ENCOUNTER — Encounter: Payer: Self-pay | Admitting: Pulmonary Disease

## 2020-03-16 ENCOUNTER — Telehealth: Payer: Self-pay | Admitting: Pulmonary Disease

## 2020-03-16 NOTE — Telephone Encounter (Signed)
Spoke with the pt  She is aware of response per Arlys John  She verbalized understanding Nothing further needed

## 2020-03-16 NOTE — Telephone Encounter (Signed)
03/16/2020  Those are not typical symptoms of a reaction from [place].  Typically this medication is well-tolerated.  Looks like patient last saw Dr. Isaiah Serge in December/2021.  It was recommended that she follow-up in 3 months.  I would recommend that she discuss this with Dr. Isaiah Serge and schedule a follow-up with him.  Of note it appears that first Fasenra injection was given on 02/23/2020.  Patient needs to be aware that in order to be considered a failed biologic therapy we typically do not consider this for at least the first 6 months of treatment.  Either way would recommend the patient have an in office visit with Dr. Isaiah Serge to further discuss and review and set appropriate goals for biologic therapy as well as asthma control  Elisha Headland, FNP

## 2020-03-16 NOTE — Telephone Encounter (Signed)
Called and spoke with patient who states that she got her first Fasenra injection on 02/23/20 and has been feeling much better and that her chest feels much better a well. She is due for her next injection on Monday. About a week ago she noticed that when she breathes out you can hear wheezing, dry cough, dry mouth and some shortness of breath.   Please advise

## 2020-03-16 NOTE — Telephone Encounter (Signed)
Called and spoke with patient. She was confused about the response. I read back the message that sent was Arlys John. She stated that that was incorrect. She is still having the symptoms of wheezing, dry cough and mouth as well as SOB with exertion. She denied any anaphylaxis symptoms. She wants to know if she should continue the Fasenra injections considering her side effects.   Did attempt to make an appt for her but she only wanted recommendations for now.   Arlys John, can you please advise? Thanks.

## 2020-03-16 NOTE — Telephone Encounter (Signed)
03/16/2020  I am glad to hear the patient was feeling better with Harrington Challenger.  Would recommend patient continues to take her Symbicort as well as her Singulair.  If she continues to have the symptoms listed above I would recommend that she have an in person physical exam for [place] to further evaluate.  Elisha Headland, FNP

## 2020-03-23 ENCOUNTER — Telehealth: Payer: Self-pay | Admitting: Pulmonary Disease

## 2020-03-23 NOTE — Telephone Encounter (Signed)
Called and spoke with Patient. Patient stated she needed to order Fasenra injection. [employer] contact number with ordering instructions given to Patient. Nothing further at this time.

## 2020-04-02 ENCOUNTER — Ambulatory Visit: Payer: Medicare PPO | Admitting: Pulmonary Disease

## 2020-04-02 ENCOUNTER — Other Ambulatory Visit: Payer: Self-pay

## 2020-04-02 ENCOUNTER — Encounter: Payer: Self-pay | Admitting: Pulmonary Disease

## 2020-04-02 VITALS — BP 130/78 | HR 69 | Temp 97.4°F | Ht 68.0 in | Wt 166.0 lb

## 2020-04-02 DIAGNOSIS — J4550 Severe persistent asthma, uncomplicated: Secondary | ICD-10-CM

## 2020-04-02 MED ORDER — PANTOPRAZOLE SODIUM 40 MG PO TBEC: 40.0000 mg | DELAYED_RELEASE_TABLET | Freq: Every day | ORAL | 3 refills | Status: DC

## 2020-04-02 MED ORDER — FLUTICASONE PROPIONATE 50 MCG/ACT NA SUSP: 2.0000 | Freq: Every day | NASAL | 3 refills | Status: DC

## 2020-04-02 NOTE — Progress Notes (Signed)
Yolanda Vargas    [id number]    November 26, 1950  Primary Care Physician:Yolanda Vargas, Yolanda Maki, PA  Referring Physician: Wilfrid Lund, PA [address] [place],  [place] [address]  Chief complaint:  Follow-up for severe persistent asthma Yolanda Vargas Feb 2022  HPI: 70 year old with history of allergies, asthma, hyperlipidemia, hypertension Here to establish care for management of asthma  Previously followed by Dr Yolanda Vargas, [facility] She was diagnosed with asthma around 2012 after a methacholine challenge test.  Maintained on Symbicort, Singulair, Allegra and Flonase nasal spray  Received note from Dr. Mellody Vargas, [facility] visit dated September 10, 2017 Patient has severe persistent asthma maintained on ProAir, Singulair and Symbicort 160/4.5  Pets: Has a dog Occupation: Retired Engineer, site and principal Exposures: No mold, hot tub, Jacuzzi.  No feather pillows or comforters Smoking history: Never smoker Travel history: Recently moved from [place] Relevant family history: No significant family history of lung disease  Interim history: Started on [place] in Feb 2022 as she continued to have significant burden of disease with daily symptoms and poor control  Overall she says that her breathing is better but does note increased upper airway congestion and wheezing Denies any heartburn symptoms.  She does have postnasal drip attributed to allergies.  Outpatient Encounter Medications as of 04/02/2020  Medication Sig  . amLODipine (NORVASC) 5 MG tablet   . escitalopram (LEXAPRO) 10 MG tablet   . levothyroxine (SYNTHROID) 88 MCG tablet Take 88 mcg by mouth daily.  . meloxicam (MOBIC) 15 MG tablet As needed  . montelukast (SINGULAIR) 10 MG tablet   . rosuvastatin (CRESTOR) 10 MG tablet   . SYMBICORT 160-4.5 MCG/ACT inhaler    No facility-administered encounter medications on file as of 04/02/2020.   Physical  Exam: Blood pressure 124/64, pulse 67, temperature (!) 96.7 F (35.9 C), temperature source Skin, height 5\' 7"  (1.702 m), weight 154 lb (69.9 kg), SpO2 99 %. Gen:      No acute distress HEENT:  EOMI, sclera anicteric Neck:     No masses; no thyromegaly Lungs:    Clear to auscultation bilaterally; normal respiratory effort CV:         Regular rate and rhythm; no murmurs Abd:      + bowel sounds; soft, non-tender; no palpable masses, no distension Ext:    No edema; adequate peripheral perfusion Skin:      Warm and dry; no rash Neuro: alert and oriented x 3 Psych: normal mood and affect  Data Reviewed: Imaging:  PFTs: PFTs 09/06/2016 FVC 3.63 [102%], FEV1 2.60 [95%], F/F 72, TLC 5.21 [91%], DLCO 24.92 [73%] Minimal obstruction with bronchodilator response, mild reduction in diffusion capacity.  Normal lung volume.  12/08/19 FVC 3.48 [100%], FEV1 2.66 [100%], F/F 77, TLC 5.95 [96%], DLCO 22.75 [93%] Normal test  ACT score 10/07/2019-16 ACT score 12/08/2019-18 ACT score 04/02/2020- 13  Labs: CBC 06/20/2019-WBC 6.1, eos 2.3%, absolute eosinophil count 140 CBC 10/07/2019-WBC 6.9, eos 4.4%, absolute eosinophil count 304  IgE 10/07/2019-24  Assessment:  Severe persistent asthma Continue current regimen with Symbicort, Singulair and Allegra Though PFTs today are normal she continues to have severe symptoms with poor control  Continue Fasenra.  She has noted some improvement.  We discussed that it may take up to 6 months for full effect of the new medication  Postnasal drip, GERD, upper airway congestion and wheezing Resume Flonase Although she does  not have overt heartburn symptoms we will add Protonix once daily for silent reflux She has an appointment pending with ENT   Plan/Recommendations: Continue Symbicort, Singulair and Allegra,  Start Flonase, PPI ENT evaluation Yolanda Dolores MD [facility] 04/02/2020, 9:00 AM  CC: Yolanda Lund,  PA

## 2020-04-02 NOTE — Addendum Note (Signed)
Addended by: Dorisann Frames R on: 04/02/2020 10:44 AM   Modules accepted: Orders

## 2020-04-02 NOTE — Patient Instructions (Signed)
Continue your injections Continue the inhalers and Singulair We will resume Flonase nasal spray Protonix 40 mg once daily for acid reflux  Return in 6 months

## 2020-05-11 ENCOUNTER — Ambulatory Visit: Payer: Medicare PPO | Admitting: Podiatry

## 2020-05-11 ENCOUNTER — Ambulatory Visit (INDEPENDENT_AMBULATORY_CARE_PROVIDER_SITE_OTHER): Payer: Medicare PPO

## 2020-05-11 ENCOUNTER — Other Ambulatory Visit: Payer: Self-pay

## 2020-05-11 DIAGNOSIS — M21619 Bunion of unspecified foot: Secondary | ICD-10-CM | POA: Diagnosis not present

## 2020-05-11 DIAGNOSIS — M21611 Bunion of right foot: Secondary | ICD-10-CM | POA: Diagnosis not present

## 2020-05-11 DIAGNOSIS — Z79899 Other long term (current) drug therapy: Secondary | ICD-10-CM | POA: Diagnosis not present

## 2020-05-11 DIAGNOSIS — M779 Enthesopathy, unspecified: Secondary | ICD-10-CM | POA: Diagnosis not present

## 2020-05-11 DIAGNOSIS — B351 Tinea unguium: Secondary | ICD-10-CM | POA: Diagnosis not present

## 2020-05-11 DIAGNOSIS — M778 Other enthesopathies, not elsewhere classified: Secondary | ICD-10-CM

## 2020-05-11 NOTE — Patient Instructions (Addendum)
Bunion A bunion (hallux valgus) is a bump that forms slowly on the inner side of the big toe joint. It occurs when the big toe turns toward the second toe. Bunions may be small at first, but they often get larger over time. They can make walking painful. What are the causes? This condition may be caused by:  Wearing narrow or pointed shoes that force the big toe to press against the other toes.  Abnormal foot development that causes the foot to roll inward.  Changes in the foot that are caused by certain diseases, such as rheumatoid arthritis or polio.  A foot injury. What increases the risk? The following factors may make you more likely to develop this condition:  Wearing shoes that squeeze the toes together.  Having certain diseases, such as: ? Rheumatoid arthritis. ? Polio. ? Cerebral palsy.  Having family members who have bunions.  Being born with abnormally shaped feet (a foot deformity), such as flat feet or low arches.  Doing activities that put a lot of pressure on the feet, such as ballet dancing. What are the signs or symptoms? The main symptom of this condition is a bump on your big toe that you can notice. Other symptoms may include:  Pain.  Redness and inflammation around your big toe.  Thick or hardened skin on your big toe or between your toes.  Stiffness or loss of motion in your big toe.  Trouble with walking.   How is this diagnosed? This condition may be diagnosed based on your symptoms, medical history, and activities. You may also have tests and imaging, such as:  X-rays. These allow your health care provider to check the position of the bones in your foot and look for damage to your joint. They also help your health care provider determine the severity of your bunion and the best way to treat it.  Joint aspiration. In this test, a sample of fluid is removed from the toe joint. This test may be done if you are in a lot of pain. It helps rule out  diseases that cause painful swelling of the joints, such as arthritis or gout. How is this treated? Treatment depends on the severity of your symptoms. The goal of treatment is to relieve symptoms and prevent your bunion from getting worse. Your health care provider may recommend:  Wearing shoes that have a wide toe box, or using bunion pads to cushion the affected area.  Taping your toes together to keep them in a normal position.  Placing a device inside your shoe (orthotic device) to help reduce pressure on your toe joint.  Taking medicine to ease pain and inflammation.  Putting ice or heat on the affected area.  Doing stretching exercises.  Surgery, for severe cases. Follow these instructions at home: Managing pain, stiffness, and swelling  If directed, put ice on the painful area. To do this: ? Put ice in a plastic bag. ? Place a towel between your skin and the bag. ? Leave the ice on for 20 minutes, 2-3 times a day. ? Remove the ice if your skin turns bright red. This is very important. If you cannot feel pain, heat, or cold, you have a greater risk of damage to the area.  If directed, apply heat to the affected area before you exercise. Use the heat source that your health care provider recommends, such as a moist heat pack or a heating pad. ? Place a towel between your skin and the   heat source. ? Leave the heat on for 20-30 minutes. ? Remove the heat if your skin turns bright red. This is especially important if you are unable to feel pain, heat, or cold. You have a greater risk of getting burned.      General instructions  Do exercises as told by your health care provider.  Support your toe joint with proper footwear, shoe padding, or taping as told by your health care provider.  Take over-the-counter and prescription medicines only as told by your health care provider.  Do not use any products that contain nicotine or tobacco, such as cigarettes, e-cigarettes, and  chewing tobacco. If you need help quitting, ask your health care provider.  Keep all follow-up visits. This is important. Contact a health care provider if:  Your symptoms get worse.  Your symptoms do not improve in 2 weeks. Get help right away if:  You have severe pain and trouble with walking. Summary  A bunion is a bump on the inner side of the big toe joint that forms when the big toe turns toward the second toe.  Bunions can make walking painful.  Treatment depends on the severity of your symptoms.  Support your toe joint with proper footwear, shoe padding, or taping as told by your health care provider. This information is not intended to replace advice given to you by your health care provider. Make sure you discuss any questions you have with your health care provider. Document Revised: 04/25/2019 Document Reviewed: 04/25/2019 Elsevier Patient Education  2021 Elsevier Inc.   Terbinafine tablets What is this medicine? TERBINAFINE (TER bin a feen) is an antifungal medicine. It is used to treat certain kinds of fungal or yeast infections. This medicine may be used for other purposes; ask your health care provider or pharmacist if you have questions. COMMON BRAND NAME(S): Lamisil, Terbinex What should I tell my health care provider before I take this medicine? They need to know if you have any of these conditions:  drink alcoholic beverages  kidney disease  liver disease  an unusual or allergic reaction to terbinafine, other medicines, foods, dyes, or preservatives  pregnant or trying to get pregnant  breast-feeding How should I use this medicine? Take this medicine by mouth with a full glass of water. Follow the directions on the prescription label. You can take this medicine with food or on an empty stomach. Take your medicine at regular intervals. Do not take your medicine more often than directed. Do not skip doses or stop your medicine early even if you feel  better. Do not stop taking except on your doctor's advice. A special MedGuide will be given to you by the pharmacist with each prescription and refill. Be sure to read this information carefully each time. Talk to your pediatrician regarding the use of this medicine in children. Special care may be needed. Overdosage: If you think you have taken too much of this medicine contact a poison control center or emergency room at once. NOTE: This medicine is only for you. Do not share this medicine with others. What if I miss a dose? If you miss a dose, take it as soon as you can. If it is almost time for your next dose, take only that dose. Do not take double or extra doses. What may interact with this medicine? Do not take this medicine with any of the following medications:  thioridazine This medicine may also interact with the following medications:  beta-blockers  caffeine  cimetidine  cyclosporine  medicines for depression, anxiety, or psychotic disturbances  medicines for fungal infections like fluconazole and ketoconazole  medicines for irregular heartbeat like amiodarone, flecainide and propafenone  rifampin  warfarin This list may not describe all possible interactions. Give your health care provider a list of all the medicines, herbs, non-prescription drugs, or dietary supplements you use. Also tell them if you smoke, drink alcohol, or use illegal drugs. Some items may interact with your medicine. What should I watch for while using this medicine? Visit your doctor or health care provider regularly. Tell your doctor right away if you have nausea or vomiting, loss of appetite, stomach pain on your right upper side, yellow skin, dark urine, light stools, or are over tired. Some fungal infections need many weeks or months of treatment to cure. If you are taking this medicine for a long time, you will need to have important blood work done. This medicine may cause serious skin  reactions. They can happen weeks to months after starting the medicine. Contact your health care provider right away if you notice fevers or flu-like symptoms with a rash. The rash may be red or purple and then turn into blisters or peeling of the skin. Or, you might notice a red rash with swelling of the face, lips or lymph nodes in your neck or under your arms. What side effects may I notice from receiving this medicine? Side effects that you should report to your doctor or health care professional as soon as possible:  allergic reactions like skin rash or hives, swelling of the face, lips, or tongue  changes in vision  dark urine  fever or infection  general ill feeling or flu-like symptoms  light-colored stools  loss of appetite, nausea  rash, fever, and swollen lymph nodes  redness, blistering, peeling or loosening of the skin, including inside the mouth  right upper belly pain  unusually weak or tired  yellowing of the eyes or skin Side effects that usually do not require medical attention (report to your doctor or health care professional if they continue or are bothersome):  changes in taste  diarrhea  hair loss  muscle or joint pain  stomach gas  stomach upset This list may not describe all possible side effects. Call your doctor for medical advice about side effects. You may report side effects to FDA at 1-800-FDA-1088. Where should I keep my medicine? Keep out of the reach of children. Store at room temperature below 25 degrees C (77 degrees F). Protect from light. Throw away any unused medicine after the expiration date. NOTE: This sheet is a summary. It may not cover all possible information. If you have questions about this medicine, talk to your doctor, pharmacist, or health care provider.  2021 Elsevier/Gold Standard (2018-03-29 15:37:07)

## 2020-05-11 NOTE — Progress Notes (Signed)
dg 

## 2020-05-12 NOTE — Progress Notes (Signed)
Subjective:   Patient ID: Yolanda Vargas, female   DOB: 70 y.o.   MRN: [id number]   HPI 70 year old female presents the office with concerns of pain along bunions which has been more of a chronic issue but is been getting worse over time.  She recently started to use the Peloton and cycling.  Since doing this the bunion the right side been aggravated she is also starting to get some discomfort in the top of her foot.  No swelling or injury that she reports.  It seemed that the symptoms in the right foot correlated to when she started wearing the cycling shoes with the Peloton.  Also she has concerns of nail fungus.  She previous was on Lamisil for 3 months that did help however the nails still discolored and thickened she is interested in going back on the medication.   Review of Systems  All other systems reviewed and are negative.  Past Medical History:  Diagnosis Date  . Asthma   . Hypertension   . Thyroid goiter     Past Surgical History:  Procedure Laterality Date  . BREAST BIOPSY Left   . GALLBLADDER SURGERY    . HYSTEROTOMY  2020     Current Outpatient Medications:  .  amLODipine (NORVASC) 5 MG tablet, , Disp: , Rfl:  .  escitalopram (LEXAPRO) 10 MG tablet, , Disp: , Rfl:  .  fluticasone (FLONASE) 50 MCG/ACT nasal spray, Place 2 sprays into both nostrils daily., Disp: 16 g, Rfl: 3 .  levothyroxine (SYNTHROID) 88 MCG tablet, Take 88 mcg by mouth daily., Disp: , Rfl:  .  meloxicam (MOBIC) 15 MG tablet, As needed, Disp: , Rfl:  .  montelukast (SINGULAIR) 10 MG tablet, , Disp: , Rfl:  .  pantoprazole (PROTONIX) 40 MG tablet, Take 1 tablet (40 mg total) by mouth daily., Disp: 30 tablet, Rfl: 3 .  rosuvastatin (CRESTOR) 10 MG tablet, , Disp: , Rfl:  .  SYMBICORT 160-4.5 MCG/ACT inhaler, , Disp: , Rfl:   Allergies  Allergen Reactions  . Diltiazem Hcl Other (See Comments)    Stoke like symptoms   . Penicillins Rash  . Cardizem [Diltiazem]   . Doxycycline Rash  . Hydrocodone  Palpitations  . Lisinopril Cough and Other (See Comments)    Other reaction(s): Cough          Objective:  Physical Exam  General: AAO x3, NAD  Dermatological: Mild erythema on the medial first metatarsal head on the bunion site from irritation inside shoes from the bunions.  No skin breakdown, warmth or any signs of infection.  No open lesions.  Hallux nails hypertrophic, dystrophic with yellow-brown discoloration.  There is dystrophy of the nail present.  Vascular: Dorsalis Pedis artery and Posterior Tibial artery pedal pulses are 2/4 bilateral with immedate capillary fill time. There is no pain with calf compression, swelling, warmth, erythema.   Neruologic: Grossly intact via light touch bilateral.   Musculoskeletal: Significant bunion deformities present bilaterally.  Mild discomfort the dorsal aspect the right foot but no specific area of pinpoint tenderness.  No edema, erythema.  Flexor, extensor tendons appear to be intact.  MMT 5/5.  Gait: Unassisted, Nonantalgic.       Assessment:   Bilateral symptomatic bunions, right foot capsulitis/tendinitis; onychomycosis     Plan:  -Treatment options discussed including all alternatives, risks, and complications -Etiology of symptoms were discussed -X-rays were obtained and reviewed with the patient.  Bunion deformities present.  No evidence of acute  fracture. -Reports the bunions we discussed both conservative as well as surgical treatment options.  She is interested in surgical intervention.  Discussed with her the Lapidus bunionectomy.  She wants to consider doing this in November.  In the meantime discussed offloading pads which were dispensed today.  Discussed shoe modifications. -The right foot pain seems to have worsened since wearing the clip in Peloton shoes.  The shoes are too narrow that she brought in today and she has a wide forefoot given the bunion.  Also the strap does cross right over the top of the foot.  We  discussed offloading and changing shoes.  -Ordered blood work for Lamisil.  30 minutes was spent with the patient and greater than 50% of time was in face-to-face contact.  Vivi Barrack DPM

## 2020-12-14 ENCOUNTER — Telehealth: Payer: Self-pay

## 2020-12-14 NOTE — Telephone Encounter (Signed)
Spoke with patient and she has stopped taking the Fasenra injection and is now getting allergy injections. Patient would like to make a follow up with Dr. Isaiah Serge but will have to call back tomorrow.

## 2020-12-14 NOTE — Telephone Encounter (Signed)
ATC patient to review if she is still taking [place]. Unable to reach - left VM requesting return call. She receives through AZ[employer]Me and likely only approved through 01/01/21.  She also needs f/u appt with Dr. Isaiah Serge - she started Fasenra on 02/23/20 and was seen on 04/02/20 with no f/u since then. Routing to scheduling team  Chesley Mires, PharmD, MPH, BCPS Clinical Pharmacist (Rheumatology and Pulmonology)

## 2020-12-14 NOTE — Telephone Encounter (Signed)
PA renewal initiated automatically by CoverMyMeds.  Initiated a Prior Authorization request to [employer] for [facility] via CoverMyMeds.  Key: [id number]    Patient's last OV 04/02/2020 stating patient was to continue taking medication, despite Harrington Challenger not appearing on pt's active medication list.

## 2020-12-17 NOTE — Telephone Encounter (Signed)
Noted. Prior authorization renewal for [facility] cancelled.  Chesley Mires, PharmD, MPH, BCPS Clinical Pharmacist (Rheumatology and Pulmonology)

## 2021-01-13 ENCOUNTER — Other Ambulatory Visit: Payer: Self-pay | Admitting: Obstetrics & Gynecology

## 2021-01-13 DIAGNOSIS — Z1231 Encounter for screening mammogram for malignant neoplasm of breast: Secondary | ICD-10-CM

## 2021-01-20 ENCOUNTER — Other Ambulatory Visit: Payer: Self-pay

## 2021-01-20 ENCOUNTER — Ambulatory Visit: Payer: Medicare PPO | Admitting: Podiatry

## 2021-01-20 DIAGNOSIS — Z79899 Other long term (current) drug therapy: Secondary | ICD-10-CM | POA: Diagnosis not present

## 2021-01-20 DIAGNOSIS — M79675 Pain in left toe(s): Secondary | ICD-10-CM | POA: Diagnosis not present

## 2021-01-20 DIAGNOSIS — B351 Tinea unguium: Secondary | ICD-10-CM | POA: Diagnosis not present

## 2021-01-20 DIAGNOSIS — L600 Ingrowing nail: Secondary | ICD-10-CM | POA: Diagnosis not present

## 2021-01-20 MED ORDER — SULFAMETHOXAZOLE-TRIMETHOPRIM 800-160 MG PO TABS: 1.0000 | ORAL_TABLET | Freq: Two times a day (BID) | ORAL | 0 refills | Status: DC

## 2021-01-20 NOTE — Patient Instructions (Addendum)
Place 1/4 cup of epsom salts in a quart of warm tap water.  Submerge your foot or feet in the solution and soak for 20 minutes.  This soak should be done twice a day.  Next, remove your foot or feet from solution, blot dry the affected area. Apply ointment and cover if instructed by your doctor.   IF YOUR SKIN BECOMES IRRITATED WHILE USING THESE INSTRUCTIONS, IT IS OKAY TO SWITCH TO  WHITE VINEGAR AND WATER.  As another alternative soak, you may use antibacterial soap and water.  Monitor for any signs/symptoms of infection. Call the office immediately if any occur or go directly to the emergency room. Call with any questions/concerns.   Terbinafine Tablets What is this medication? TERBINAFINE (TER bin a feen) treats fungal infections of the nails. It belongs to a group of medications called antifungals. It will not treat infections caused by bacteria or viruses. This medicine may be used for other purposes; ask your health care provider or pharmacist if you have questions. COMMON BRAND NAME(S): Lamisil, Terbinex What should I tell my care team before I take this medication? They need to know if you have any of these conditions: Liver disease An unusual or allergic reaction to terbinafine, other medications, foods, dyes, or preservatives Pregnant or trying to get pregnant Breast-feeding How should I use this medication? Take this medication by mouth with water. Take it as directed on the prescription label at the same time every day. You can take it with or without food. If it upsets your stomach, take it with food. Keep taking it unless your care team tells you to stop. A special MedGuide will be given to you by the pharmacist with each prescription and refill. Be sure to read this information carefully each time. Talk to your care team regarding the use of this medication in children. Special care may be needed. Overdosage: If you think you have taken too much of this medicine contact a poison  control center or emergency room at once. NOTE: This medicine is only for you. Do not share this medicine with others. What if I miss a dose? If you miss a dose, take it as soon as you can unless it is more than 4 hours late. If it is more than 4 hours late, skip the missed dose. Take the next dose at the normal time. What may interact with this medication? Do not take this medication with any of the following: Pimozide Thioridazine This medication may also interact with the following: Beta blockers Caffeine Certain medications for mental health conditions Cimetidine Cyclosporine Medications for fungal infections like fluconazole and ketoconazole Medications for irregular heartbeat like amiodarone, flecainide and propafenone Rifampin Warfarin This list may not describe all possible interactions. Give your health care provider a list of all the medicines, herbs, non-prescription drugs, or dietary supplements you use. Also tell them if you smoke, drink alcohol, or use illegal drugs. Some items may interact with your medicine. What should I watch for while using this medication? Visit your care team for regular checks on your progress. You may need blood work while you are taking this medication. It may be some time before you see the benefit from this medication. This medication may cause serious skin reactions. They can happen weeks to months after starting the medication. Contact your care team right away if you notice fevers or flu-like symptoms with a rash. The rash may be red or purple and then turn into blisters or peeling of the  skin. Or, you might notice a red rash with swelling of the face, lips or lymph nodes in your neck or under your arms. This medication can make you more sensitive to the sun. Keep out of the sun, If you cannot avoid being in the sun, wear protective clothing and sunscreen. Do not use sun lamps or tanning beds/booths. What side effects may I notice from receiving  this medication? Side effects that you should report to your care team as soon as possible: Allergic reactions--skin rash, itching, hives, swelling of the face, lips, tongue, or throat Change in sense of smell Change in taste Infection--fever, chills, cough, or sore throat Liver injury--right upper belly pain, loss of appetite, nausea, light-colored stool, dark yellow or brown urine, yellowing skin or eyes, unusual weakness or fatigue Low red blood cell level--unusual weakness or fatigue, dizziness, headache, trouble breathing Lupus-like syndrome--joint pain, swelling, or stiffness, butterfly-shaped rash on the face, rashes that get worse in the sun, fever, unusual weakness or fatigue Rash, fever, and swollen lymph nodes Redness, blistering, peeling, or loosening of the skin, including inside the mouth Unusual bruising or bleeding Worsening mood, feelings of depression Side effects that usually do not require medical attention (report to your care team if they continue or are bothersome): Diarrhea Gas Headache Nausea Stomach pain Upset stomach This list may not describe all possible side effects. Call your doctor for medical advice about side effects. You may report side effects to FDA at 1-800-FDA-1088. Where should I keep my medication? Keep out of the reach of children and pets. Store between 20 and 25 degrees C (68 and 77 degrees F). Protect from light. Get rid of any unused medication after the expiration date. To get rid of medications that are no longer needed or have expired: Take the medication to a medication take-back program. Check with your pharmacy or law enforcement to find a location. If you cannot return the medication, check the label or package insert to see if the medication should be thrown out in the garbage or flushed down the toilet. If you are not sure, ask your care team. If it is safe to put it in the trash, take the medication out of the container. Mix the  medication with cat litter, dirt, coffee grounds, or other unwanted substance. Seal the mixture in a bag or container. Put it in the trash. NOTE: This sheet is a summary. It may not cover all possible information. If you have questions about this medicine, talk to your doctor, pharmacist, or health care provider.  2022 Elsevier/Gold Standard (2020-08-04 00:00:00)

## 2021-01-24 ENCOUNTER — Telehealth: Payer: Self-pay | Admitting: *Deleted

## 2021-01-24 NOTE — Telephone Encounter (Signed)
Returned the call to patient and give instructions to patient to discontinue the medication and that something else will be called to pharmacy,verbalized understanding. She is having the lab work (metabolic panel)faxed over to our office from Dr Marilynne Drivers as well.

## 2021-01-24 NOTE — Telephone Encounter (Signed)
Patient is calling because she prescribed a sulfur drug after nail removal, possible reaction to this which may include: gas pressure,constipation, vomiting(has not had in 30 years), not a virus and it came on suddenly.  Is there a substitute? Should she continue? Please advise.

## 2021-01-24 NOTE — Progress Notes (Signed)
Subjective: 71 year old female presents the office today for concerns of nail fungus particular left big toe recently getting worse.  She has noticed some redness around the nail.  She tried soaking it become very loose.  No fevers or chills.  No new concerns otherwise.  Objective: AAO x3, NAD DP/PT pulses palpable bilaterally, CRT less than 3 seconds Left hallux toenail significantly hypertrophic, dystrophic brown discoloration quite loosely underlying nailbed on The Proximal Aspect.  Incurvation of the Nail Borders.  There Is No Purulence Identified Today.  There Is Mild Surrounding Erythema without Any Ascending Cellulitis.  No Fluctuation Crepitation Is Noted. Nails are dystrophic, hypertrophic nail discoloration as well.  No pain on the nails. Still present which is unchanged. No pain with calf compression, swelling, warmth, erythema  Assessment: Left hallux onychodystrophy, ingrown toenail; bunion  Plan: -All treatment options discussed with the patient including all alternatives, risks, complications.  -At this time, recommended total nail removal without chemical matricectomy to the left hallux due to the significant thickening, loosening and ingrowing of the nail. Risks and complications were discussed with the patient for which they understand and  verbally consent to the procedure. Under sterile conditions a total of 3 mL of a mixture of 2% lidocaine plain and 0.5% Marcaine plain was infiltrated in a hallux block fashion. Once anesthetized, the skin was prepped in sterile fashion. A tourniquet was then applied.  The left hallux toenail was then removed in total.  There is no purulence noted skin under the nail was intact.  Once the nail was removed, the area was debrided and the underlying skin was intact. The area was irrigated and hemostasis was obtained.  A dry sterile dressing was applied. After application of the dressing the tourniquet was removed and there is found to be an  immediate capillary refill time to the digit. The patient tolerated the procedure well any complications. Post procedure instructions were discussed the patient for which he verbally understood. Follow-up in one week for nail check or sooner if any problems are to arise. Discussed signs/symptoms of worsening infection and directed to call the office immediately should any occur or go directly to the emergency room. In the meantime, encouraged to call the office with any questions, concerns, changes symptoms. -Discussed treatment options for nail fungus given nail fungus on the nails.  We will restart Lamisil.  Check CBC and LFT prior.  Vivi Barrack DPM

## 2021-01-25 NOTE — Telephone Encounter (Signed)
Yes, called them and they have sent over,in folder

## 2021-01-27 ENCOUNTER — Other Ambulatory Visit: Payer: Self-pay | Admitting: Podiatry

## 2021-01-27 DIAGNOSIS — Z79899 Other long term (current) drug therapy: Secondary | ICD-10-CM

## 2021-01-27 MED ORDER — TERBINAFINE HCL 250 MG PO TABS: 250.0000 mg | ORAL_TABLET | Freq: Every day | ORAL | 0 refills | Status: DC

## 2021-01-27 NOTE — Telephone Encounter (Signed)
Called the patient and gave results from blood wk and that a prescription has been sent to pharmacy, other recommendations to have blood work rechecked in 4-6 weeks.She verbalized understanding.

## 2021-02-01 ENCOUNTER — Ambulatory Visit
Admission: RE | Admit: 2021-02-01 | Discharge: 2021-02-01 | Disposition: A | Discharge status: Home / Self Care | Payer: Medicare PPO | Source: Ambulatory Visit | Attending: Obstetrics & Gynecology | Admitting: Obstetrics & Gynecology

## 2021-02-01 DIAGNOSIS — Z1231 Encounter for screening mammogram for malignant neoplasm of breast: Secondary | ICD-10-CM

## 2021-02-02 ENCOUNTER — Telehealth: Payer: Self-pay | Admitting: *Deleted

## 2021-02-02 NOTE — Telephone Encounter (Signed)
Patient is calling to let the physician know that  her left foot, great toe nailbed is now slightly pink and a puffy ridge around the edge, some inflammation around the nailbed. She has an upcoming appointment 02/07/21,should she be concerned?Please advise.

## 2021-02-02 NOTE — Telephone Encounter (Signed)
Called patient and gave instructions/recommendations per Dr Marylene Land, verbalized understanding.

## 2021-02-07 ENCOUNTER — Other Ambulatory Visit: Payer: Self-pay

## 2021-02-07 ENCOUNTER — Ambulatory Visit: Payer: Medicare PPO | Admitting: Podiatry

## 2021-02-07 DIAGNOSIS — B351 Tinea unguium: Secondary | ICD-10-CM | POA: Diagnosis not present

## 2021-02-07 DIAGNOSIS — L600 Ingrowing nail: Secondary | ICD-10-CM | POA: Diagnosis not present

## 2021-02-07 NOTE — Patient Instructions (Signed)

## 2021-02-10 NOTE — Progress Notes (Signed)
Subjective: 71 year old female presents the office today for follow-up evaluation after undergoing left total nail avulsion.  She said that she is doing well not having any significant pain.  No swelling redness or any drainage.  No fevers or chills that she reports no other concerns.  Objective: AAO x3, NAD DP/PT pulses palpable bilaterally, CRT less than 3 seconds Status post left total nail avulsion.  Nailbed looks scabbed over, granulation tissue.  There is no edema, erythema or signs of infection. Other nails appear to be unchanged. No pain with calf compression, swelling, warmth, erythema  Assessment: Status post partial nail avulsion, onychomycosis  Plan: -All treatment options discussed with the patient including all alternatives, risks, complications.  -From a procedure standpoint healing well.  Continue soaking Epsom salts at least wash with soap and water daily and apply a small amount of antibiotic ointment and a bandage in the day.  Leave the area open at nighttime.  Monitor for any signs or symptoms of infection or reoccurrence. -Lamisil.  Continue to monitor for any side effects. -Patient encouraged to call the office with any questions, concerns, change in symptoms.

## 2021-04-22 ENCOUNTER — Ambulatory Visit (INDEPENDENT_AMBULATORY_CARE_PROVIDER_SITE_OTHER): Payer: Medicare PPO | Admitting: Obstetrics & Gynecology

## 2021-04-22 ENCOUNTER — Encounter: Payer: Self-pay | Admitting: Obstetrics & Gynecology

## 2021-04-22 VITALS — BP 126/80 | HR 70 | Resp 16 | Ht 66.25 in | Wt 165.0 lb

## 2021-04-22 DIAGNOSIS — Z01419 Encounter for gynecological examination (general) (routine) without abnormal findings: Secondary | ICD-10-CM

## 2021-04-22 DIAGNOSIS — Z780 Asymptomatic menopausal state: Secondary | ICD-10-CM

## 2021-04-22 DIAGNOSIS — M8580 Other specified disorders of bone density and structure, unspecified site: Secondary | ICD-10-CM

## 2021-04-22 NOTE — Progress Notes (Signed)
? ? ?Yolanda Vargas 02-15-1950 [id number] ? ? ?History:    71 y.o. G3P3L3 ?  ?RP:  Established patient presenting for annual gyn exam  ?  ?HPI: Status post total hysterectomy/BSO.  Postmenopause, well on no hormone replacement therapy.  No pelvic pain.  Currently abstinent.  Remote h/o LEEP.  Pap Neg since Hysterectomy.  Last Pap Neg in 2019.  Declines further Paps.  Urine and bowel movements normal.   Breasts normal.  Screening mammo Neg in 01/2021.  Body mass index at 26.43.  Health labs with family PA.  BMD Osteopenia in 2022.  COLONOSCOPY: 2015. ? ?Past medical history,surgical history, family history and social history were all reviewed and documented in the EPIC chart. ? ?Gynecologic History ?No LMP recorded. Patient has had a hysterectomy. ? ?Obstetric History ?OB History  ?Gravida Para Term Preterm AB Living  ?3 3 3     3   ?SAB IAB Ectopic Multiple Live Births  ?           ?  ?# Outcome Date GA Lbr Len/2nd Weight Sex Delivery Anes PTL Lv  ?3 Term           ?2 Term           ?1 Term           ? ? ? ?ROS: A ROS was performed and pertinent positives and negatives are included in the history. ?GENERAL: No fevers or chills. HEENT: No change in vision, no earache, sore throat or sinus congestion. NECK: No pain or stiffness. CARDIOVASCULAR: No chest pain or pressure. No palpitations. PULMONARY: No shortness of breath, cough or wheeze. GASTROINTESTINAL: No abdominal pain, nausea, vomiting or diarrhea, melena or bright red blood per rectum. GENITOURINARY: No urinary frequency, urgency, hesitancy or dysuria. MUSCULOSKELETAL: No joint or muscle pain, no back pain, no recent trauma. DERMATOLOGIC: No rash, no itching, no lesions. ENDOCRINE: No polyuria, polydipsia, no heat or cold intolerance. No recent change in weight. HEMATOLOGICAL: No anemia or easy bruising or bleeding. NEUROLOGIC: No headache, seizures, numbness, tingling or weakness. PSYCHIATRIC: No depression, no loss of interest in normal activity or change in  sleep pattern.  ?  ? ?Exam: ? ? ?BP 126/80   Pulse 70   Resp 16   Ht 5' 6.25" (1.683 m)   Wt 165 lb (74.8 kg)   BMI 26.43 kg/m?  ? ?Body mass index is 26.43 kg/m?. ? ?General appearance : Well developed well nourished female. No acute distress ?HEENT: Eyes: no retinal hemorrhage or exudates,  Neck supple, trachea midline, no carotid bruits, no thyroidmegaly ?Lungs: Clear to auscultation, no rhonchi or wheezes, or rib retractions  ?Heart: Regular rate and rhythm, no murmurs or gallops ?Breast:Examined in sitting and supine position were symmetrical in appearance, no palpable masses or tenderness,  no skin retraction, no nipple inversion, no nipple discharge, no skin discoloration, no axillary or supraclavicular lymphadenopathy ?Abdomen: no palpable masses or tenderness, no rebound or guarding ?Extremities: no edema or skin discoloration or tenderness ? ?Pelvic: Vulva: Normal ?            Vagina: No gross lesions or discharge ? Cervix/Uterus absent ? Adnexa  Without masses or tenderness ? Anus: Normal ? ? ?Assessment/Plan:  71 y.o. female for annual exam  ? ?1. Well female exam with routine gynecological exam ?Status post total hysterectomy/BSO.  Postmenopause, well on no hormone replacement therapy.  No pelvic pain.  Currently abstinent.  Remote h/o LEEP.  Pap Neg since Hysterectomy.  Last  Pap Neg in 2019.  Declines further Paps.  Urine and bowel movements normal.   Breasts normal.  Screening mammo Neg in 01/2021.  Body mass index at 26.43.  Health labs with family PA.  BMD Osteopenia in 2022.  COLONOSCOPY: 2015. ? ?2. Postmenopause ?Status post total hysterectomy/BSO.  Postmenopause, well on no hormone replacement therapy.  No pelvic pain.  Currently abstinent.   ? ?3. Osteopenia, unspecified location  ?Osteopenia in 2022. Vit D, Ca++ total 1.5 g/d, regular wt bearing physical activities. ? ?Genia Del MD, 3:29 PM 04/22/2021 ? ?  ?

## 2021-06-02 DIAGNOSIS — E678 Other specified hyperalimentation: Secondary | ICD-10-CM

## 2021-06-02 HISTORY — DX: Other specified hyperalimentation: E67.8

## 2021-09-02 ENCOUNTER — Telehealth: Payer: Self-pay | Admitting: Pulmonary Disease

## 2021-09-02 NOTE — Telephone Encounter (Signed)
Please advise on provider change?  

## 2021-09-06 NOTE — Telephone Encounter (Signed)
Ok with me 

## 2021-09-06 NOTE — Telephone Encounter (Signed)
Patient scheduled with Dr. Judeth Horn 09/29/21 at 1100.  Nothing further at this time.

## 2021-09-29 ENCOUNTER — Encounter: Payer: Self-pay | Admitting: Pulmonary Disease

## 2021-09-29 ENCOUNTER — Ambulatory Visit: Payer: Medicare PPO | Admitting: Pulmonary Disease

## 2021-09-29 VITALS — BP 110/78 | HR 71 | Ht 67.5 in | Wt 156.2 lb

## 2021-09-29 DIAGNOSIS — J4540 Moderate persistent asthma, uncomplicated: Secondary | ICD-10-CM

## 2021-09-29 DIAGNOSIS — R0602 Shortness of breath: Secondary | ICD-10-CM

## 2021-09-29 MED ORDER — BREZTRI AEROSPHERE 160-9-4.8 MCG/ACT IN AERO: 2.0000 | INHALATION_SPRAY | Freq: Two times a day (BID) | RESPIRATORY_TRACT | 0 refills | Status: DC

## 2021-09-29 MED ORDER — BREZTRI AEROSPHERE 160-9-4.8 MCG/ACT IN AERO: 2.0000 | INHALATION_SPRAY | Freq: Two times a day (BID) | RESPIRATORY_TRACT | 11 refills | Status: DC

## 2021-09-29 NOTE — Progress Notes (Signed)
@Patient  ID: , female    DOB: 1950-05-26, 71 y.o.   MRN: [id number]  Chief Complaint  Patient presents with   Consult  Chief complaint: Difficulty getting deep breath  Referring provider: [id number], PA  HPI:   71 y.o. with history of asthma here to establish care with me as new patient. Most recent Pulmonary note from Dr. 62 04/2020 reviewed.  Patient states that in 1999 she was bitten by a tick.  Since then she has had a series of medical abnormalities and symptoms she relates back to that.  She is concerned about likelihood of having chronic Lyme disease.  Symptoms include stomach upset, GI issues.  In terms of respiratory issues, she states that since that time she has had some shortness of breath.  In terms of exertional issues, she can exert self and exercise okay.  However at rest and sometimes with exertion she has a sensation of difficulty getting a deep breath.  Heaviness or pressure or tightness in her chest.  Symptoms have lingered and been stable these past 20+ years.  Some associated cough.  She is tried multiple different medications for this.  She inhalers helped some.  Currently on Symbicort.  Use albuterol as needed with some mild improvement at times.  Was previously on Fasenra.  She felt this did not help to chest tightness or heaviness.  However she had some other side effects, not liking the way it made her felt etc.  This was stopped.  The side effects seem to improve.  She is currently undergoing allergy shots.  Recently resumed these.  In effort to help symptoms.  She had a chest x-ray 10/2019 personally reviewed and interpreted as clear lungs with hyperinflation.  She had pulmonary function test in 2016 and chart was reviewed and showed spirometry with significant bronchodilator response over 200 cc and 13% increase in FEV1.  She had a repeat PFT here in [facility] 12/2019 at [facility] pulmonary they are all normal, see below for full  interpretation.   Questionaires / Pulmonary Flowsheets:   ACT:  Asthma Control Test ACT Total Score  09/29/2021 11:10 AM 17  12/08/2019 10:20 AM 18  10/07/2019 10:51 AM 16    MMRC:     No data to display          Epworth:      No data to display          Tests:   FENO:  No results found for: "NITRICOXIDE"  PFT:    Latest Ref Rng & Units 12/08/2019    9:02 AM 09/06/2016    9:39 AM  PFT Results  FVC-Pre L 3.40    FVC-Predicted Pre % 98    FVC-Post L 3.48    FVC-Predicted Post % 100    Pre FEV1/FVC % % 75    Post FEV1/FCV % % 77    FEV1-Pre L 2.56    FEV1-Predicted Pre % 97    FEV1-Post L 2.66    DLCO uncorrected ml/min/mmHg 22.75    DLCO UNC% % 103    DLCO corrected ml/min/mmHg 22.75    DLCO COR %Predicted % 103    DLVA Predicted % 101    TLC L 5.95  1.74      TLC % Predicted % 106    RV % Predicted % 108       This result is from an external source.  2021 PFTs personally reviewed and interpreted as normal spirometry, no  bronchodilator spots, lung volumes within normal limits, DLCO within normal limits, flow volume loop looks mildly obstructed 2016 spirometry from [facility] with significant bronchodilator response in FEV1  WALK:      No data to display          Imaging: Personally reviewed and as per EMR and discussion in this note No results found.  Lab Results: Personally reviewed CBC    Component Value Date/Time   WBC 6.9 10/07/2019 1130   RBC 4.38 10/07/2019 1130   HGB 14.8 10/07/2019 1130   HCT 44.2 10/07/2019 1130   PLT 202.0 10/07/2019 1130   MCV 100.8 (H) 10/07/2019 1130   MCHC 33.5 10/07/2019 1130   RDW 12.9 10/07/2019 1130   LYMPHSABS 1.7 10/07/2019 1130   MONOABS 0.5 10/07/2019 1130   EOSABS 0.3 10/07/2019 1130   BASOSABS 0.1 10/07/2019 1130    BMET No results found for: "NA", "K", "CL", "CO2", "GLUCOSE", "BUN", "CREATININE", "CALCIUM", "GFRNONAA", "GFRAA"  BNP No results found for:  "BNP"  ProBNP No results found for: "PROBNP"  Specialty Problems   None   Allergies  Allergen Reactions   Diltiazem Hcl Other (See Comments)    Stoke like symptoms    Penicillins Rash   Doxycycline Rash   Hydrocodone Palpitations   Lisinopril Cough and Other (See Comments)    Other reaction(s): Cough     Immunization History  Administered Date(s) Administered   Influenza Split 12/15/2019   Influenza, High Dose Seasonal PF 12/04/2016   Influenza,inj,Quad PF,6+ Mos 09/16/2018   Influenza,inj,quad, With Preservative 09/02/2017   PFIZER(Purple Top)SARS-COV-2 Vaccination 02/08/2019, 03/01/2019, 10/02/2019   Pneumococcal Conjugate-13 05/13/2015   Pneumococcal Polysaccharide-23 05/04/2014   Tdap 08/09/2009, 06/20/2019   Zoster Recombinat (Shingrix) 12/24/2017, 02/21/2018   Zoster, Live 08/13/2017    Past Medical History:  Diagnosis Date   Abnormal Pap smear of cervix    Asthma    Hypertension    Osteopenia    Thyroid goiter     Tobacco History: Social History   Tobacco Use  Smoking Status Never  Smokeless Tobacco Never   Counseling given: Not Answered   Continue to not smoke  Outpatient Encounter Medications as of 09/29/2021  Medication Sig   albuterol (VENTOLIN HFA) 108 (90 Base) MCG/ACT inhaler Inhale 2 puffs by inhalation route.   amLODipine (NORVASC) 5 MG tablet    Budeson-Glycopyrrol-Formoterol (BREZTRI AEROSPHERE) 160-9-4.8 MCG/ACT AERO Inhale 2 puffs into the lungs 2 (two) times daily.   chlorthalidone (HYGROTON) 25 MG tablet Take 12.5 mg by mouth every morning.   escitalopram (LEXAPRO) 10 MG tablet    levothyroxine (SYNTHROID) 88 MCG tablet Take 75 mcg by mouth daily.   montelukast (SINGULAIR) 10 MG tablet    pantoprazole (PROTONIX) 40 MG tablet Take 1 tablet (40 mg total) by mouth daily.   SYMBICORT 160-4.5 MCG/ACT inhaler    fluticasone (FLONASE) 50 MCG/ACT nasal spray Place 2 sprays into both nostrils daily. (Patient not taking: Reported on  09/29/2021)   meloxicam (MOBIC) 15 MG tablet As needed (Patient not taking: Reported on 09/29/2021)   rosuvastatin (CRESTOR) 10 MG tablet  (Patient not taking: Reported on 09/29/2021)   No facility-administered encounter medications on file as of 09/29/2021.     Review of Systems  Review of Systems  No chest pain with exertion.  No orthopnea or PND.  Comprehensive review of systems otherwise negative. Physical Exam  BP 110/78 (BP Location: Left Arm, Cuff Size: Normal)   Pulse 71   Ht 5' 7.5" (1.715  m)   Wt 156 lb 3.2 oz (70.9 kg)   SpO2 99%   BMI 24.10 kg/m   Wt Readings from Last 5 Encounters:  09/29/21 156 lb 3.2 oz (70.9 kg)  04/22/21 165 lb (74.8 kg)  04/02/20 166 lb (75.3 kg)  12/08/19 158 lb 9.6 oz (71.9 kg)  10/07/19 154 lb (69.9 kg)    BMI Readings from Last 5 Encounters:  09/29/21 24.10 kg/m  04/22/21 26.43 kg/m  04/02/20 25.24 kg/m  12/08/19 24.47 kg/m  10/07/19 24.12 kg/m     Physical Exam General: Sitting in chair, no acute distress Eyes: EOMI, no icterus Neck: Supple, no JVP Pulmonary: Clear, normal work of breathing Cardiovascular: Warm, no edema Abdomen: Nondistended, bowel sounds present MSK: No synovitis, no joint effusion Neuro: Normal gait, no weakness Psych: Normal mood, full affect   Assessment & Plan:   Dyspnea, or sensation of difficulty getting deep breaths: Possibly related to poorly controlled asthma.  Some improvement on Fasenra in the past.  This was stopped due to perceived side effects however.  Fortunately, PFTs 12/2019 were all normal.  PFTs in 2016 in [place] did show significant bronchodilator response in FEV1.  See below for change in asthma therapy.  Moderate persistent asthma with prior PFTs normal with evidence of obstruction on flow volume loop: With ongoing symptoms of chest discomfort, sensation not getting deep breath.  Exertional dyspnea seems okay.  Some cough as well.  [facility] Symb[facility]ort [facility] for  additional bronchodilation to see if helps with symptoms.  Consider addition of Biologics, Nucala in the future given some improvement with Harrington Challenger but other intolerable side effects.  Notably, eosinophils were 300 when last checked in October 2021.  Healthcare maintenance: Recommended COVID booster, flu shot, RSV vaccine.   Return in about 6 months (around 03/30/2022).   Karren Burly, MD 09/29/2021   This appointment required 62 minutes of patient care (this includes precharting, chart review, review of results, face-to-face care, etc.).

## 2021-09-29 NOTE — Patient Instructions (Signed)
Nice to meet you  In effort to try to help with your symptoms lets try a different inhaler.  Stop the Symbicort.  Use Breztri 2 puffs twice a day every day.  Rinse her mouth out with water after every use.  I provided some samples today and I sent a new prescription.  Please be mindful of the dose counter on the top of the inhaler particular with the samples as there are considerably less doses in the samples compared to the normal inhaler.  If the Judithann Sauger is too expensive we cannot obtain this for some reason that is okay.  Continue the Symbicort.  We will look for a different option for inhalers if needed.  Return to clinic in 6 months or sooner as needed with Dr. Silas Flood

## 2021-11-21 ENCOUNTER — Ambulatory Visit: Payer: Medicare PPO | Admitting: Podiatry

## 2021-11-21 ENCOUNTER — Ambulatory Visit (INDEPENDENT_AMBULATORY_CARE_PROVIDER_SITE_OTHER): Payer: Medicare PPO

## 2021-11-21 DIAGNOSIS — M21612 Bunion of left foot: Secondary | ICD-10-CM

## 2021-11-21 DIAGNOSIS — M21619 Bunion of unspecified foot: Secondary | ICD-10-CM

## 2021-11-21 DIAGNOSIS — B351 Tinea unguium: Secondary | ICD-10-CM | POA: Diagnosis not present

## 2021-11-21 DIAGNOSIS — L600 Ingrowing nail: Secondary | ICD-10-CM | POA: Diagnosis not present

## 2021-11-21 NOTE — Patient Instructions (Signed)
Pre-Operative Instructions  Congratulations, you have decided to take an important step to improving your quality of life.  You can be assured that the doctors of [facility] will be with you every step of the way.  Plan to be at the surgery center/hospital at least 1 (one) hour prior to your scheduled time unless otherwise directed by the surgical center/hospital staff.  You must have a responsible adult accompany you, remain during the surgery and drive you home.  Make sure you have directions to the surgical center/hospital and know how to get there on time. For hospital based surgery you will need to obtain a history and physical form from your family physician within 1 month prior to the date of surgery- we will give you a form for you primary physician.  We make every effort to accommodate the date you request for surgery.  There are however, times where surgery dates or times have to be moved.  We will contact you as soon as possible if a change in schedule is required.   No Aspirin/Ibuprofen for one week before surgery.  If you are on aspirin, any non-steroidal anti-inflammatory medications (Mobic, Aleve, Ibuprofen) you should stop taking it 7 days prior to your surgery.  You make take Tylenol  For pain prior to surgery.  Medications- If you are taking daily heart and blood pressure medications, seizure, reflux, allergy, asthma, anxiety, pain or diabetes medications, make sure the surgery center/hospital is aware before the day of surgery so they may notify you which medications to take or avoid the day of surgery. No food or drink after midnight the night before surgery unless directed otherwise by surgical center/hospital staff. No alcoholic beverages 24 hours prior to surgery.  No smoking 24 hours prior to or 24 hours after surgery. Wear loose pants or shorts- loose enough to fit over bandages, boots, and casts. No slip on shoes, sneakers are best. Bring your boot with you to the  surgery center/hospital.  Also bring crutches or a walker if your physician has prescribed it for you.  If you do not have this equipment, it will be provided for you after surgery. If you have not been contracted by the surgery center/hospital by the day before your surgery, call to confirm the date and time of your surgery. Leave-time from work may vary depending on the type of surgery you have.  Appropriate arrangements should be made prior to surgery with your employer. Prescriptions will be provided immediately following surgery by your doctor.  Have these filled as soon as possible after surgery and take the medication as directed. Remove nail polish on the operative foot. Wash the night before surgery.  The night before surgery wash the foot and leg well with the antibacterial soap provided and water paying special attention to beneath the toenails and in between the toes.  Rinse thoroughly with water and dry well with a towel.  Perform this wash unless told not to do so by your physician.  Enclosed: 1 Ice pack (please put in freezer the night before surgery)   1 Hibiclens skin cleaner   Pre-op Instructions  If you have any questions regarding the instructions, do not hesitate to call our office at any point during this process.   [place]: [address] [place], [place] [address] [phone]  [place]: [address] [place], [place] [address] [phone]  Dr. Mikaella Escalona, DPM  

## 2021-11-21 NOTE — Progress Notes (Addendum)
Subjective: Chief Complaint  Patient presents with   Bunions    Patient came in today for left foot bunion pain, started 3 weeks ago, numbness, rate of pain 4 out of 10, X-Rays taken today,     71 year old female presents the office with above concerns.  At this point she wants to further discuss bunion surgery and she wants to proceed with this given the ongoing pain.  She tried shoe modifications, offloading, padding and conservative care without significant resolution.  She feels that on the tip of the left big toe she states it is strange sensation. It is slightly numb or something. It also looks a little inflammed at the top of the toe. If she puts pressure on the nail, it is not comfortable. Her daughters dog stepped on it and it was not comfrotable.   Objective: AAO x3, NAD DP/PT pulses palpable bilaterally, CRT less than 3 seconds Left hallux toenail is hypertrophic, dystrophic with yellow, brown discoloration noninterventional nail border without any edema, erythema or signs of infection. On the left foot.  No significant crepitation or restriction of first MPJ range of motion.  MMT 5/5. No pain with calf compression, swelling, warmth, erythema  Assessment: Left foot symptomatic bunion, onychodystrophy/ingrown toenail  Plan: -All treatment options discussed with the patient including all alternatives, risks, complications.  -X-rays were obtained and reviewed.  3 views left foot obtained.  Severe intermetatarsal angle increase noted with intermetatarsal angle of about 16 degrees.  No evidence of acute fracture.  -We discussed with conservative as well as surgical treatment options.  After discussion she was proceed with surgical intervention.  I discussed with her Lapidus, Akin osteotomy versus MPJ arthrodesis if there is arthritis present in the joint.  We discussed both the procedure as well as postoperative course and she agrees to proceed.  Made the decision intraoperatively. -The  incision placement as well as the postoperative course was discussed with the patient. I discussed risks of the surgery which include, but not limited to, infection, bleeding, pain, swelling, need for further surgery, delayed or nonhealing, painful or ugly scar, numbness or sensation changes, over/under correction, recurrence, transfer lesions, further deformity, hardware failure, DVT/PE, loss of toe/foot. Patient understands these risks and wishes to proceed with surgery. The surgical consent was reviewed with the patient all 3 pages were signed. No promises or guarantees were given to the outcome of the procedure. All questions were answered to the best of my ability. Before the surgery the patient was encouraged to call the office if there is any further questions. The surgery will be performed at the [facility] on an outpatient basis. -CAM boot for postoperative use -We discussed conservative care versus excision of ingrown toenail.  At this time she was offered a procedure.  We will start her to treat the toenail thickening I think this is also causing her issue as well as the position of the toe given the bunion.  I have ordered a compound cream through [facility]  Vivi Barrack DPM

## 2021-11-29 ENCOUNTER — Other Ambulatory Visit: Payer: Self-pay | Admitting: Podiatry

## 2021-11-29 DIAGNOSIS — B351 Tinea unguium: Secondary | ICD-10-CM

## 2021-11-29 DIAGNOSIS — M21619 Bunion of unspecified foot: Secondary | ICD-10-CM

## 2021-11-29 DIAGNOSIS — L600 Ingrowing nail: Secondary | ICD-10-CM

## 2021-12-01 ENCOUNTER — Encounter: Payer: Self-pay | Admitting: Podiatry

## 2021-12-01 ENCOUNTER — Encounter: Payer: Self-pay | Admitting: Pulmonary Disease

## 2021-12-01 NOTE — Telephone Encounter (Signed)
Dr. Judeth Horn, please advise on pt's message regarding Breztri. Thanks.

## 2021-12-02 NOTE — Telephone Encounter (Signed)
Dr. Judeth Horn, please see pt's most recent mychart message about the biologic and advise.

## 2021-12-02 NOTE — Telephone Encounter (Signed)
Can we send paperwork for nucala? Eos 300 in 2021. BD response on prior PFTs. Poor asthma control. Triple inhaled therapy  with symptoms no better.

## 2021-12-05 ENCOUNTER — Telehealth: Payer: Self-pay | Admitting: Pharmacist

## 2021-12-05 DIAGNOSIS — J4540 Moderate persistent asthma, uncomplicated: Secondary | ICD-10-CM

## 2021-12-05 NOTE — Telephone Encounter (Signed)
Received notification of Nucala new start via MyChart. PAP paperwork mailed to patient via April Leffler, RN.  SAVED a Prior Authorization request to [facility] for NUCALA via CoverMyMeds. Will update once we receive a response. Patient will need updated CBC w diff for eosinophil. Spoke with patinet. She will go to [facility] in [place]. Lab order released today and faxed  CMM Key: [id number]  Chesley Mires, PharmD, MPH, BCPS, CPP Clinical Pharmacist (Rheumatology and Pulmonology)

## 2021-12-05 NOTE — Telephone Encounter (Signed)
Will start Nucala BIV in new phone encounter for pharmacy team to continue f/u. Thanks!  Chesley Mires, PharmD, MPH, BCPS, CPP Clinical Pharmacist (Rheumatology and Pulmonology)

## 2021-12-09 ENCOUNTER — Telehealth: Payer: Self-pay | Admitting: Podiatry

## 2021-12-09 NOTE — Telephone Encounter (Addendum)
DOS: 01/11/2022  Dr. Annamary Rummage Assisting  [facility] Medicare  Lapidus Procedure Including Bunionectomy Lt [phone]) & Aiken Osteotomy Lt [phone]) VS. Hallux MPJ Fusion Lt [phone])  Deductible: $ Out-of-Pocket: $ CoInsurance:  Authorization #: [id number] Tracking ID: [id number] Authorization Valid: 01/11/2022 Only

## 2021-12-12 ENCOUNTER — Encounter: Payer: Self-pay | Admitting: Podiatry

## 2021-12-16 NOTE — Telephone Encounter (Signed)
Spoke with patient regarding labwork needed for Nucala. She states she went but line was too long at [facility]. She will go either today or early next week. Will continue to f/u  Chesley Mires, PharmD, MPH, BCPS, CPP Clinical Pharmacist (Rheumatology and Pulmonology)

## 2021-12-19 ENCOUNTER — Telehealth: Payer: Self-pay | Admitting: Podiatry

## 2021-12-19 NOTE — Telephone Encounter (Signed)
Patients full voicemail didn't come through due to an error with the phone either on our end or the patients end.   What was heard was "to thinking that I was having something similar and I realized that that's much less recovery time and less painful. I just wondered if that's anything Dr. Ardelle Anton could do or the surgery center offers. I would appreciate some info on that please."

## 2021-12-20 ENCOUNTER — Encounter: Payer: Self-pay | Admitting: Podiatry

## 2021-12-20 LAB — CBC WITH DIFFERENTIAL/PLATELET
Basophils Absolute: 0.1 10*3/uL (ref 0.0–0.2)
Basos: 1 %
EOS (ABSOLUTE): 0.6 10*3/uL — ABNORMAL HIGH (ref 0.0–0.4)
Eos: 11 %
Hematocrit: 42.2 % (ref 34.0–46.6)
Hemoglobin: 14.2 g/dL (ref 11.1–15.9)
Immature Grans (Abs): 0 10*3/uL (ref 0.0–0.1)
Immature Granulocytes: 0 %
Lymphocytes Absolute: 2 10*3/uL (ref 0.7–3.1)
Lymphs: 35 %
MCH: 32.8 pg (ref 26.6–33.0)
MCHC: 33.6 g/dL (ref 31.5–35.7)
MCV: 98 fL — ABNORMAL HIGH (ref 79–97)
Monocytes Absolute: 0.5 10*3/uL (ref 0.1–0.9)
Monocytes: 9 %
Neutrophils Absolute: 2.5 10*3/uL (ref 1.4–7.0)
Neutrophils: 44 %
Platelets: 227 10*3/uL (ref 150–450)
RBC: 4.33 x10E6/uL (ref 3.77–5.28)
RDW: 11.3 % — ABNORMAL LOW (ref 11.7–15.4)
WBC: 5.7 10*3/uL (ref 3.4–10.8)

## 2021-12-20 NOTE — Telephone Encounter (Signed)
PA for Nucala submitted to [facility] with clinicals and updated CBC showing abs eosinophils of 600.  Key: [id number]   Yolanda Vargas, PharmD, MPH, BCPS, CPP Clinical Pharmacist (Rheumatology and Pulmonology)

## 2021-12-20 NOTE — Progress Notes (Signed)
Noted. Will complete  Nucala prior authorization.  Chesley Mires, PharmD, MPH, BCPS, CPP Clinical Pharmacist (Rheumatology and Pulmonology)

## 2021-12-20 NOTE — Progress Notes (Signed)
Eos elevated at 600

## 2021-12-21 NOTE — Telephone Encounter (Signed)
Dawn, can you schedule with Dr. Allena Katz.   Just FYI Dr. Allena Katz, I had scheduled a Lapidus with this patient but she wants a Lapiplasty and has requested you.

## 2021-12-23 ENCOUNTER — Other Ambulatory Visit (HOSPITAL_COMMUNITY): Payer: Self-pay

## 2021-12-23 NOTE — Telephone Encounter (Signed)
Received notification from [facility] regarding a prior authorization for NUCALA. Authorization has been APPROVED from 01/02/2021 to 01/02/2023. Approval letter sent to scan center.  Per test claim, copay for 28 days supply is $100  Patient can fill through [facility]: [phone]   Authorization # [id number]  Will need to submit PAP  Chesley Mires, PharmD, MPH, BCPS, CPP Clinical Pharmacist (Rheumatology and Pulmonology)

## 2022-01-03 ENCOUNTER — Other Ambulatory Visit (HOSPITAL_COMMUNITY): Payer: Self-pay

## 2022-01-03 NOTE — Telephone Encounter (Signed)
Per test claim today, copay is $175 for 28 day supply (1 injection). Paperwork was notated as mailed to patient. She sates she cannot afford this copay.  She states that she completed paperwork that was sent and mailed it back to our clinic shortly after Christmas weekend. We likely have not received it yet due to holiday. Will await paperwork  Knox Saliva, PharmD, MPH, BCPS, CPP Clinical Pharmacist (Rheumatology and Pulmonology)

## 2022-01-06 ENCOUNTER — Encounter: Payer: Self-pay | Admitting: Podiatry

## 2022-01-06 ENCOUNTER — Ambulatory Visit: Payer: Medicare PPO | Admitting: Podiatry

## 2022-01-06 VITALS — BP 126/80

## 2022-01-06 DIAGNOSIS — M2012 Hallux valgus (acquired), left foot: Secondary | ICD-10-CM

## 2022-01-06 DIAGNOSIS — Z01818 Encounter for other preprocedural examination: Secondary | ICD-10-CM

## 2022-01-06 NOTE — Progress Notes (Unsigned)
Subjective:  Patient ID: Yolanda Vargas, female    DOB: 11-21-1950,  MRN: [id number]  Chief Complaint  Patient presents with   Bunions    Left foot bunion     72 y.o. female presents with the above complaint.  Patient presents with complaint left severe bunion deformity.  Patient states that it is painful to touch is progressive gotten worse hurts with ambulation hurts with pressure.  She states she has tried all conservative care.  She was seen by Dr. Earleen Newport.  She wanted to get it evaluated.  She denies any other acute complaints pain scale 7 out of 10 hurts with ambulation worse with pressure she would like to discuss surgical options at this time.  Review of Systems: Negative except as noted in the HPI. Denies N/V/F/Ch.  Past Medical History:  Diagnosis Date   Abnormal Pap smear of cervix    Asthma    Hypertension    Osteopenia    Thyroid goiter     Current Outpatient Medications:    COMIRNATY SUSP injection, Inject into the muscle., Disp: , Rfl:    diclofenac (VOLTAREN) 75 MG EC tablet, Take 75 mg by mouth 2 (two) times daily., Disp: , Rfl:    albuterol (VENTOLIN HFA) 108 (90 Base) MCG/ACT inhaler, Inhale 2 puffs by inhalation route., Disp: , Rfl:    amLODipine (NORVASC) 5 MG tablet, , Disp: , Rfl:    Budeson-Glycopyrrol-Formoterol (BREZTRI AEROSPHERE) 160-9-4.8 MCG/ACT AERO, Inhale 2 puffs into the lungs 2 (two) times daily., Disp: 1 each, Rfl: 11   Budeson-Glycopyrrol-Formoterol (BREZTRI AEROSPHERE) 160-9-4.8 MCG/ACT AERO, Inhale 2 puffs into the lungs in the morning and at bedtime., Disp: 5.9 g, Rfl: 0   chlorthalidone (HYGROTON) 25 MG tablet, Take 12.5 mg by mouth every morning., Disp: , Rfl:    escitalopram (LEXAPRO) 10 MG tablet, , Disp: , Rfl:    fluticasone (FLONASE) 50 MCG/ACT nasal spray, Place 2 sprays into both nostrils daily. (Patient not taking: Reported on 09/29/2021), Disp: 16 g, Rfl: 3   levothyroxine (SYNTHROID) 88 MCG tablet, Take 75 mcg by mouth daily., Disp: ,  Rfl:    meloxicam (MOBIC) 15 MG tablet, As needed (Patient not taking: Reported on 09/29/2021), Disp: , Rfl:    montelukast (SINGULAIR) 10 MG tablet, , Disp: , Rfl:    oxybutynin (DITROPAN-XL) 5 MG 24 hr tablet, Take 1 tablet by mouth daily., Disp: , Rfl:    pantoprazole (PROTONIX) 40 MG tablet, Take 1 tablet (40 mg total) by mouth daily., Disp: 30 tablet, Rfl: 3   phenazopyridine (PYRIDIUM) 100 MG tablet, TAKE 1 TABLET BY MOUTH THREE TIMES A DAY FOR 2 DAYS, Disp: , Rfl:    rosuvastatin (CRESTOR) 10 MG tablet, , Disp: , Rfl:    SYMBICORT 160-4.5 MCG/ACT inhaler, , Disp: , Rfl:   Social History   Tobacco Use  Smoking Status Never  Smokeless Tobacco Never    Allergies  Allergen Reactions   Diltiazem Hcl Other (See Comments)    Stoke like symptoms    Penicillins Rash   Doxycycline Rash   Hydrocodone Palpitations   Lisinopril Cough and Other (See Comments)    Other reaction(s): Cough    Objective:   Vitals:   01/06/22 0856  BP: 126/80   There is no height or weight on file to calculate BMI. Constitutional Well developed. Well nourished.  Vascular Dorsalis pedis pulses palpable bilaterally. Posterior tibial pulses palpable bilaterally. Capillary refill normal to all digits.  No cyanosis or clubbing noted. Pedal  hair growth normal.  Neurologic Normal speech. Oriented to person, place, and time. Epicritic sensation to light touch grossly present bilaterally.  Dermatologic Nails well groomed and normal in appearance. No open wounds. No skin lesions.  Orthopedic: Normal joint ROM without pain or crepitus bilaterally. Hallux abductovalgus deformity present there is a track bound not a tracking deformity no intra-articular first MPJ noted. Left 1st MPJ diminished range of motion. Left 1st TMT with gross hypermobility. Right 1st MPJ diminished range of motion  Right 1st TMT without gross hypermobility. Lesser digital contractures absent bilaterally.   Radiographs: Taken and  reviewed. Hallux abductovalgus deformity present. Metatarsal parabola normal. 1st/2nd IMA: Severe; TSP: 6 out of 7  Assessment:  No diagnosis found. Plan:  Patient was evaluated and treated and all questions answered.  Hallux abductovalgus deformity, left -XR as above. -Patient has failed all conservative therapy and wishes to proceed with surgical intervention. All risks, benefits, and alternatives discussed with patient. No guarantees given. Consent reviewed and signed by patient. Post-op course explained at length. -Planned procedures: Left Lapiplasty/Lapidus fusion with possible phalangeal osteotomy -Risk factors: Age -I discussed my preoperative intraoperative sonogram with the patient extensive detail she states understanding like to proceed with surgery. -Informed surgical risk consent was reviewed and read aloud to the patient.  I reviewed the films.  I have discussed my findings with the patient in great detail.  I have discussed all risks including but not limited to infection, stiffness, scarring, limp, disability, deformity, damage to blood vessels and nerves, numbness, poor healing, need for braces, arthritis, chronic pain, amputation, death.  All benefits and realistic expectations discussed in great detail.  I have made no promises as to the outcome.  I have provided realistic expectations.  I have offered the patient a 2nd opinion, which they have declined and assured me they preferred to proceed despite the risks   No follow-ups on file.

## 2022-01-10 ENCOUNTER — Ambulatory Visit: Payer: Medicare PPO | Admitting: Podiatry

## 2022-01-11 ENCOUNTER — Other Ambulatory Visit (HOSPITAL_COMMUNITY): Payer: Self-pay

## 2022-01-11 NOTE — Telephone Encounter (Signed)
Submitted Patient Assistance Application to [facility] to [place] for NUCALA along with provider portion, PA and income documents. Will update patient when we receive a response.  Fax# [phone] Phone# [phone] 

## 2022-01-12 NOTE — Telephone Encounter (Signed)
Received notification from [facility] to [place] that they have received enrollment form and are processing.  Knox Saliva, PharmD, MPH, BCPS, CPP Clinical Pharmacist (Rheumatology and Pulmonology)

## 2022-01-17 ENCOUNTER — Encounter: Payer: Medicare PPO | Admitting: Podiatry

## 2022-01-18 ENCOUNTER — Other Ambulatory Visit: Payer: Self-pay | Admitting: Family Medicine

## 2022-01-18 DIAGNOSIS — Z1231 Encounter for screening mammogram for malignant neoplasm of breast: Secondary | ICD-10-CM

## 2022-01-18 NOTE — Telephone Encounter (Signed)
Spoke to [employer] to [place], they have initiated patient assistance review. We should hear response in 24-48 hours

## 2022-01-19 NOTE — Telephone Encounter (Signed)
Received a fax from  [place] to [place] regarding an approval for [place] patient assistance from 01/19/2022 to 01/02/2023. Approval letter sent to scan center.  Phone number: [phone]  ATC patient to schedule Nucala new start. Unable to reach. Left VM requesting return call  Knox Saliva, PharmD, MPH, BCPS, CPP Clinical Pharmacist (Rheumatology and Pulmonology)

## 2022-01-20 NOTE — Telephone Encounter (Signed)
Patient scheduled for Nucala new start on Thursday, 01/26/2022. Will use sample. Patient will r/s her allergy shots   Knox Saliva, PharmD, MPH, BCPS, CPP Clinical Pharmacist (Rheumatology and Pulmonology)

## 2022-01-26 ENCOUNTER — Other Ambulatory Visit: Payer: Medicare PPO

## 2022-01-26 ENCOUNTER — Ambulatory Visit: Payer: Medicare PPO | Admitting: Pharmacist

## 2022-01-26 DIAGNOSIS — J4550 Severe persistent asthma, uncomplicated: Secondary | ICD-10-CM

## 2022-01-26 DIAGNOSIS — Z7189 Other specified counseling: Secondary | ICD-10-CM

## 2022-01-26 MED ORDER — NUCALA 100 MG/ML ~~LOC~~ SOAJ: 100.0000 mg | SUBCUTANEOUS | 5 refills | Status: DC

## 2022-01-26 NOTE — Progress Notes (Signed)
HPI Patient presents today to [facility] to see pharmacy team for Nucala new start for eosinophilic, severe persistent asthma.  Past medical history includes HTN, allergic rhinitis, hypothyroidism, GERD, GAD  She did take Fasenra in past and had side effects (bloating, edema). She states that she did have improvement in chest pressure and asthma symptoms while on Fasenra.  She is also receiving allergy shots with Dr. Orvil Feil. She cancelled the appt she had scheduled today just in case they had some concern with receiving Nucala on the same day  She last saw Dr. Silas Flood on 09/29/2021 and Symbicort was escalated to [facility]. Patient later sent MyChart message at the end of Nov 2023 stating the Judithann Sauger was not providing much relief.  Respiratory Medications Current regimen: Breztri 160-9-4.25mcg (2 puffs twice daily)  Patient reports no known adherence challenges  OBJECTIVE Allergies  Allergen Reactions   Diltiazem Hcl Other (See Comments)    Stoke like symptoms    Penicillins Rash   Doxycycline Rash   Hydrocodone Palpitations   Lisinopril Cough and Other (See Comments)    Other reaction(s): Cough     Outpatient Encounter Medications as of 01/26/2022  Medication Sig   albuterol (VENTOLIN HFA) 108 (90 Base) MCG/ACT inhaler Inhale 2 puffs by inhalation route.   amLODipine (NORVASC) 5 MG tablet    Budeson-Glycopyrrol-Formoterol (BREZTRI AEROSPHERE) 160-9-4.8 MCG/ACT AERO Inhale 2 puffs into the lungs 2 (two) times daily.   Budeson-Glycopyrrol-Formoterol (BREZTRI AEROSPHERE) 160-9-4.8 MCG/ACT AERO Inhale 2 puffs into the lungs in the morning and at bedtime.   chlorthalidone (HYGROTON) 25 MG tablet Take 12.5 mg by mouth every morning.   COMIRNATY SUSP injection Inject into the muscle.   diclofenac (VOLTAREN) 75 MG EC tablet Take 75 mg by mouth 2 (two) times daily.   escitalopram (LEXAPRO) 10 MG tablet    fluticasone (FLONASE) 50 MCG/ACT nasal spray Place 2 sprays into both  nostrils daily. (Patient not taking: Reported on 09/29/2021)   levothyroxine (SYNTHROID) 88 MCG tablet Take 75 mcg by mouth daily.   meloxicam (MOBIC) 15 MG tablet As needed (Patient not taking: Reported on 09/29/2021)   montelukast (SINGULAIR) 10 MG tablet    oxybutynin (DITROPAN-XL) 5 MG 24 hr tablet Take 1 tablet by mouth daily.   pantoprazole (PROTONIX) 40 MG tablet Take 1 tablet (40 mg total) by mouth daily.   phenazopyridine (PYRIDIUM) 100 MG tablet TAKE 1 TABLET BY MOUTH THREE TIMES A DAY FOR 2 DAYS   rosuvastatin (CRESTOR) 10 MG tablet  (Patient not taking: Reported on 09/29/2021)   SYMBICORT 160-4.5 MCG/ACT inhaler    No facility-administered encounter medications on file as of 01/26/2022.     Immunization History  Administered Date(s) Administered   Influenza Split 12/15/2019   Influenza, High Dose Seasonal PF 12/04/2016   Influenza,inj,Quad PF,6+ Mos 09/16/2018   Influenza,inj,quad, With Preservative 09/02/2017   PFIZER(Purple Top)SARS-COV-2 Vaccination 02/08/2019, 03/01/2019, 10/02/2019   Pneumococcal Conjugate-13 05/13/2015   Pneumococcal Polysaccharide-23 05/04/2014   Tdap 08/09/2009, 06/20/2019   Zoster Recombinat (Shingrix) 12/24/2017, 02/21/2018   Zoster, Live 08/13/2017     PFTs    Latest Ref Rng & Units 12/08/2019    9:02 AM 09/06/2016    9:39 AM  PFT Results  FVC-Pre L 3.40    FVC-Predicted Pre % 98    FVC-Post L 3.48    FVC-Predicted Post % 100    Pre FEV1/FVC % % 75    Post FEV1/FCV % % 77    FEV1-Pre L 2.56  FEV1-Predicted Pre % 97    FEV1-Post L 2.66    DLCO uncorrected ml/min/mmHg 22.75    DLCO UNC% % 103    DLCO corrected ml/min/mmHg 22.75    DLCO COR %Predicted % 103    DLVA Predicted % 101    TLC L 5.95  1.74      TLC % Predicted % 106    RV % Predicted % 108       This result is from an external source.    Eosinophils Most recent blood eosinophil count was 600 cells/microL taken on 12/19/2021.   IgE: 24 on 10/07/2019  Assessment    Biologics training for mepolizumab (Nucala)  Goals of therapy: Mechanism of Action: Not fully understood. It does act an interleukin-5 (IL-5) antagonist monoclonal antibody that reduces the production and survival of eosinophils by blocking the binding of IL-5 to the alpha chain of the receptor complex on the eosinophil cell surface. Reviewed that Nucala is add-on medication and patient must continue maintenance inhaler regimen. Response to therapy: may take 3 months to 6 months to determine efficacy. Discussed that patients generally feel improvement sooner than 3 months.  Side effects: headache (19%), injection site reaction (7-15%), antibody development (6%), backache (5%), fatigue (5%)  Dose: 100 mg subcutaneously every 4 weeks  Administration/Storage:  Reviewed administration sites of thigh or abdomen (at least 2-3 inches away from abdomen). Reviewed the upper arm is only appropriate if caregiver is administering injection  Do not shake the reconstituted solution as this could lead to product foaming or precipitation. Solution should be clear to opalescent and colorless to pale yellow or pale brown, essentially particle free. Small air bubbles, however, are expected and acceptable. If particulate matter remains in the solution or if the solution appears cloudy or milky, discard the solution.  Reviewed storage of medication in refrigerator. Reviewed that Nucala can be stored at room temperature in unopened carton for up to 7 days.  Access: Approval of Nucala through: patient assistance  Patient self-administered Nucala 100mg /mL in left upper thigh using sample Nucala 100mg /mL autoinjector pen NDC: [phone] Lot: [id number] Expiration: 05/2024  Patient monitored for 30 minutes for adverse reaction.  Patient tolerated without issue. Injection site checked and no redness or swelling noted. Patient denies itchiness and irritation.  Medication Reconciliation  A drug regimen assessment  was performed, including review of allergies, interactions, disease-state management, dosing and immunization history. Medications were reviewed with the patient, including name, instructions, indication, goals of therapy, potential side effects, importance of adherence, and safe use.  Drug interaction(s): none noted  PLAN Continue Nucala 100mg  every 4 weeks. Next dose is due 02/23/2022 and every 4 weeks thereafter. Rx sent to: [facility]: [phone]. Patient provided with pharmacy phone number and advised to call to schedule shipment to her home Continue maintenance asthma regimen of:  Breztri 160-9-4.24mcg (2 puffs twice daily) Patient to follow-up with her allergy clinic in regards to spacing of Nucala with her allergy shots. While I don't see any specific concern or contraindication, sometimes allergy clinics have different recommendations in regards to this.  All questions encouraged and answered.  Instructed patient to reach out with any further questions or concerns.  Thank you for allowing pharmacy to participate in this patient's care.  This appointment required 45 minutes of patient care (this includes precharting, chart review, review of results, face-to-face care, etc.).  Knox Saliva, PharmD, MPH, BCPS, CPP Clinical Pharmacist (Rheumatology and Pulmonology)

## 2022-01-26 NOTE — Patient Instructions (Signed)
Your next NUCALA dose is due on 02/23/2022, 03/23/2022, and every 4 weeks thereafter  CONTINUE Breztri 160-9-4.86mcg (2 puffs twice daily)  Your prescription will be shipped from [facility] to [facility]. Their phone number is [phone]  Please call to schedule shipment and confirm address. They will mail your medication to your home.  You will need to be seen by your provider in 3 to 4 months to assess how NUCALA is working for you. Please ensure you have a follow-up appointment scheduled in APRIL or MARCH 2024. Call our clinic if you need to make this appointment.  How to manage an injection site reaction: Remember the 5 C's: COUNTER - leave on the counter at least 30 minutes but up to overnight to bring medication to room temperature. This may help prevent stinging COLD - place something cold (like an ice gel pack or cold water bottle) on the injection site just before cleansing with alcohol. This may help reduce pain CLARITIN - use Claritin (generic name is loratadine) for the first two weeks of treatment or the day of, the day before, and the day after injecting. This will help to minimize injection site reactions CORTISONE CREAM - apply if injection site is irritated and itching CALL ME - if injection site reaction is bigger than the size of your fist, looks infected, blisters, or if you develop hives

## 2022-02-06 ENCOUNTER — Encounter: Payer: Self-pay | Admitting: Pulmonary Disease

## 2022-02-06 MED ORDER — BUDESONIDE-FORMOTEROL FUMARATE 160-4.5 MCG/ACT IN AERO: 2.0000 | INHALATION_SPRAY | Freq: Two times a day (BID) | RESPIRATORY_TRACT | 6 refills | Status: DC

## 2022-02-06 NOTE — Telephone Encounter (Signed)
Mychart message sent by pt: Yolanda Vargas [facility] (supporting Lanier Clam, MD)9 minutes ago (10:16 AM)    Good Morning, Dr Silas Flood,   I apologize for disturbing your day but think it prudent to contact you about two issues. Since taking my first injection of Nucala, I've begun to experience the following, which began on Saturday, Feb. 3rd (I tested negative twice for Covid):    * Headachy (mild); substantial fog brain * feel a bit woozy, lightheaded, tired * Some elevated shortness of breath, particularly lying      down * If relevant:  Had just purchased highly rated oximeter;     when resting, propped up in bed during early night of      Feb 2, obtained one reading of 89; that night it         stayed around 91-92, but the few subsequent rdgs      usu. around 93 when resting. Pulse ox is higher      when moving or active during the day   Given this, I would not like to continue with this medication.    Also, I had mentioned earlier that I found Brextri less effective than the Symbicort ( feel more heaviness, wheeziness). I know I had wanted to try something different, but think the Symbicort would likely help alleviate some of my discomfort if I could switch   Sorry this is so long, but I do feel quite off. Thank you so much for your time and attention    Calirose Mccance [birth year]    Routing to Dr. Silas Flood for review.

## 2022-02-09 ENCOUNTER — Encounter: Payer: Medicare PPO | Admitting: Podiatry

## 2022-02-22 ENCOUNTER — Telehealth: Payer: Self-pay

## 2022-02-22 NOTE — Telephone Encounter (Signed)
Yolanda Vargas called to cancel her surgery with Dr. Posey Pronto on 05/15/2022. She stated she has too much going on right now to have surgery. She will call back at a later time to reschedule. Notified Dr. Posey Pronto.

## 2022-03-06 ENCOUNTER — Ambulatory Visit
Admission: RE | Admit: 2022-03-06 | Discharge: 2022-03-06 | Disposition: A | Discharge status: Home / Self Care | Payer: Medicare PPO | Source: Ambulatory Visit | Attending: Family Medicine | Admitting: Family Medicine

## 2022-03-06 DIAGNOSIS — Z1231 Encounter for screening mammogram for malignant neoplasm of breast: Secondary | ICD-10-CM

## 2022-04-17 ENCOUNTER — Ambulatory Visit
Admission: RE | Admit: 2022-04-17 | Discharge: 2022-04-17 | Disposition: A | Discharge status: Home / Self Care | Payer: Medicare PPO | Source: Ambulatory Visit | Attending: Gastroenterology | Admitting: Gastroenterology

## 2022-04-17 ENCOUNTER — Other Ambulatory Visit: Payer: Self-pay | Admitting: Gastroenterology

## 2022-04-17 DIAGNOSIS — R140 Abdominal distension (gaseous): Secondary | ICD-10-CM

## 2022-05-05 IMAGING — MG MM DIGITAL SCREENING BILAT W/ TOMO AND CAD
8 series · 8 of 24 positions shown · non-contrast
Comparison: Previous exam(s).

CLINICAL DATA: Screening.

EXAM:
DIGITAL SCREENING BILATERAL MAMMOGRAM WITH TOMOSYNTHESIS AND CAD
TECHNIQUE: Bilateral screening digital craniocaudal and mediolateral oblique
mammograms were obtained. Bilateral screening digital breast
tomosynthesis was performed. The images were evaluated with
computer-aided detection.

[R MLO synth-2D]
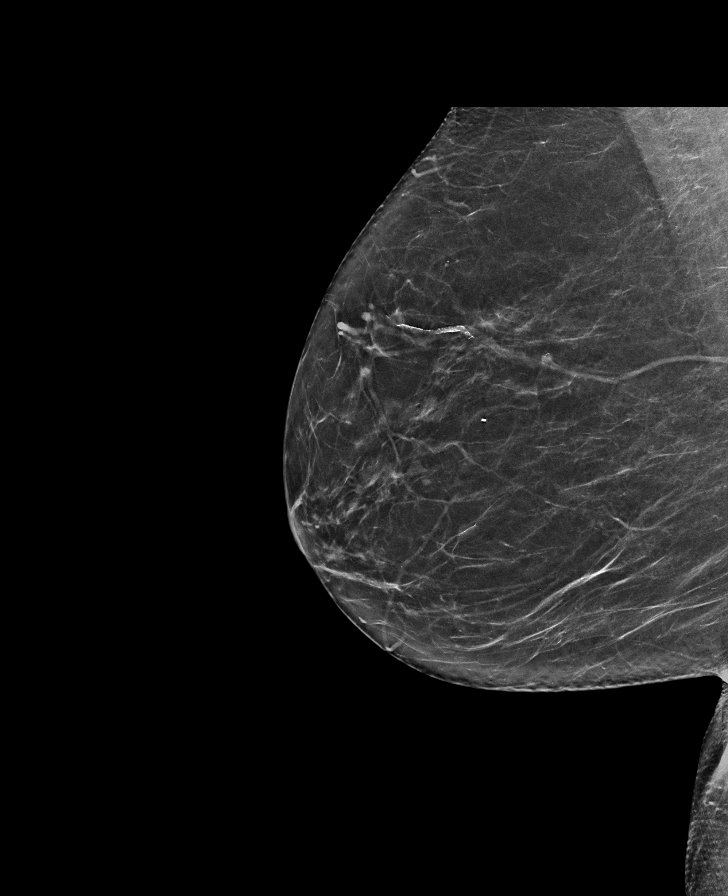

[L CC synth-2D]
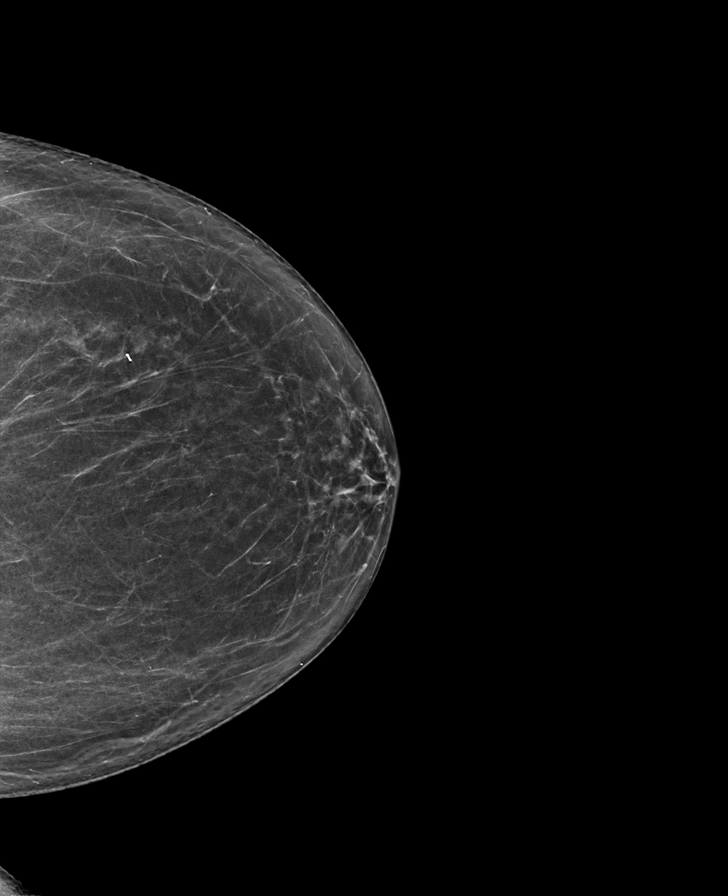

[R CC synth-2D]
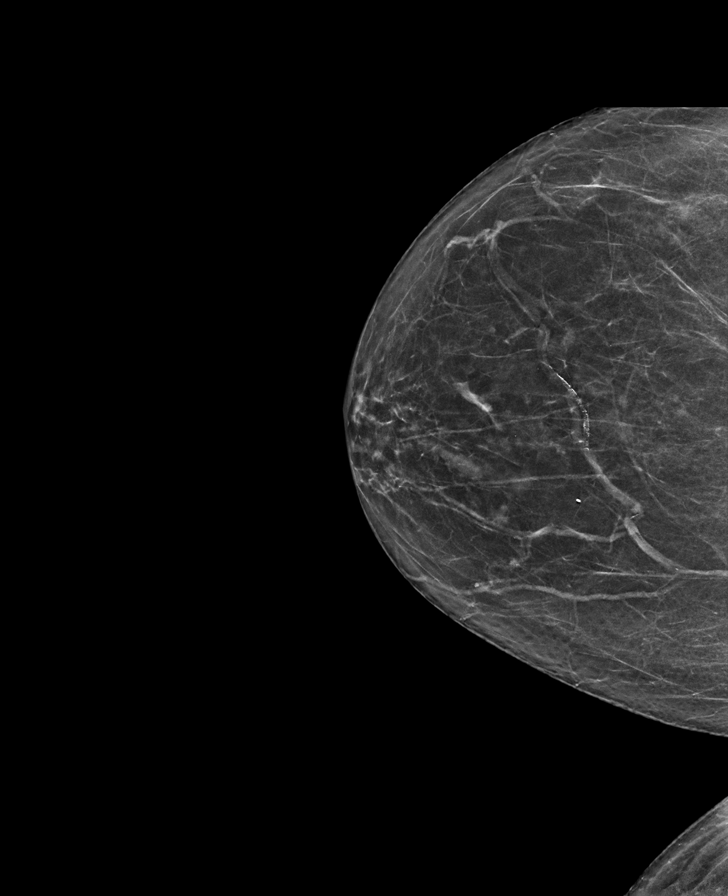

[L MLO synth-2D]
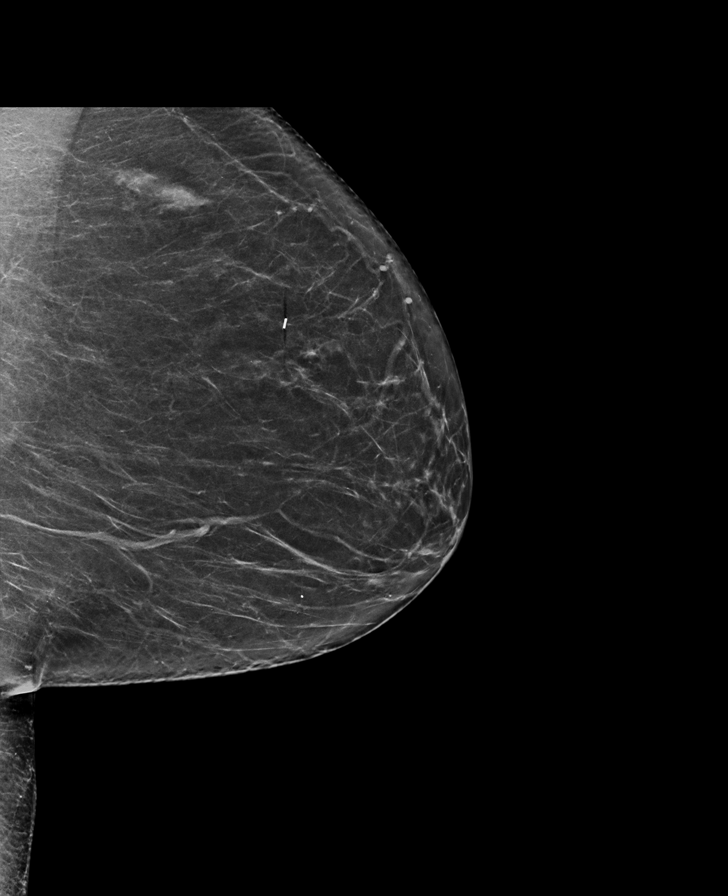

[L CC tomo · tomo slice 33/64.0]
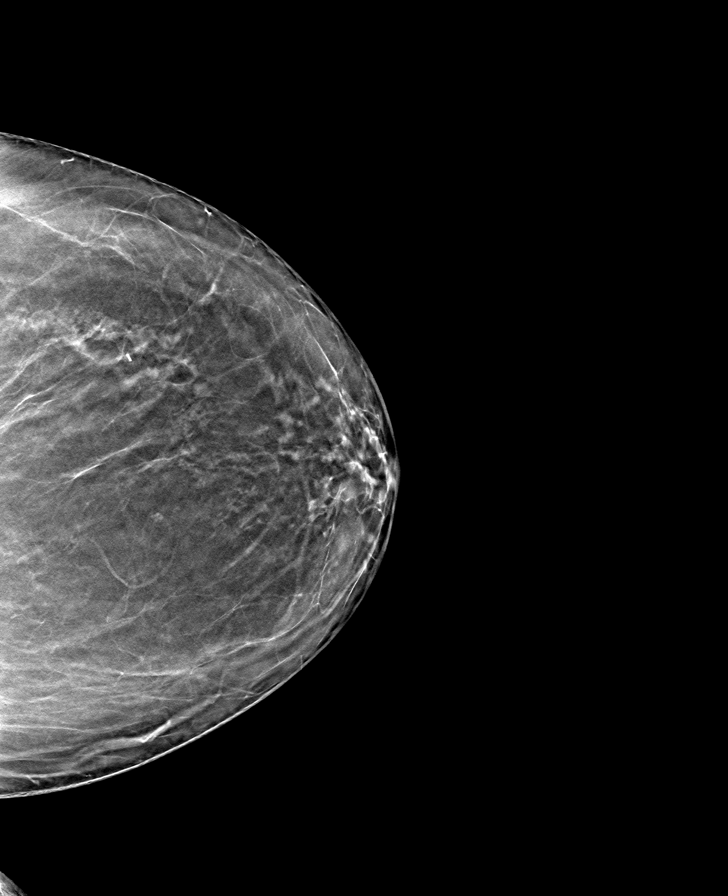

[R MLO tomo · tomo slice 33/66.0]
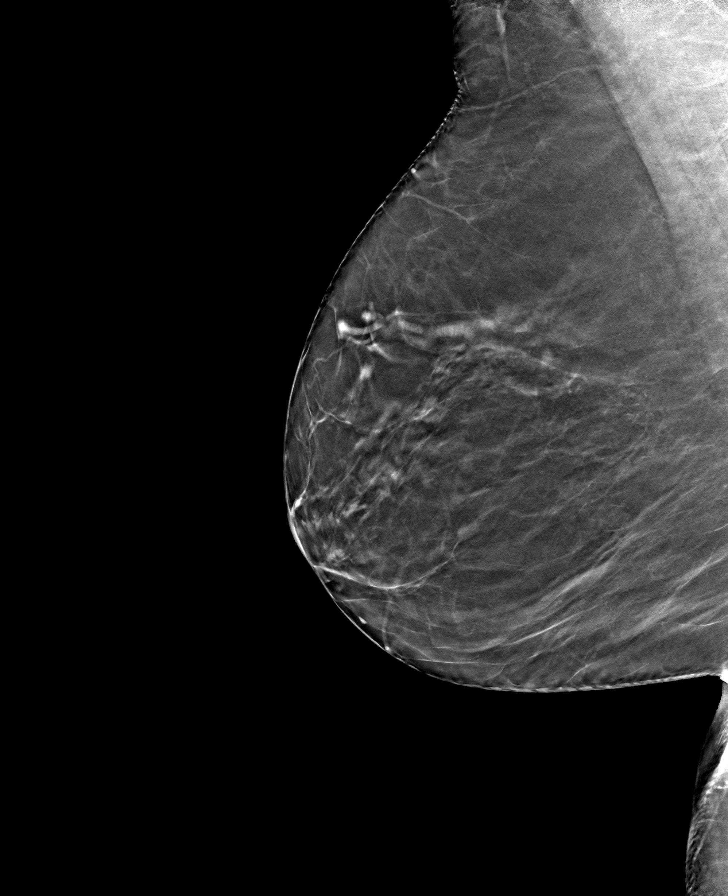

[L MLO tomo · tomo slice 37/72.0]
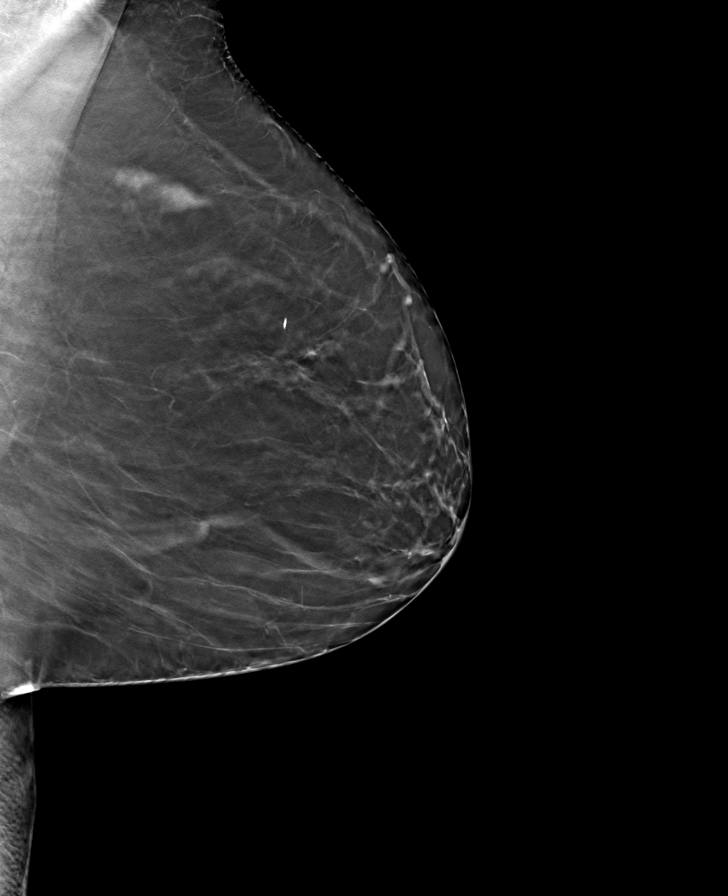

[R CC tomo · tomo slice 31/60.0]
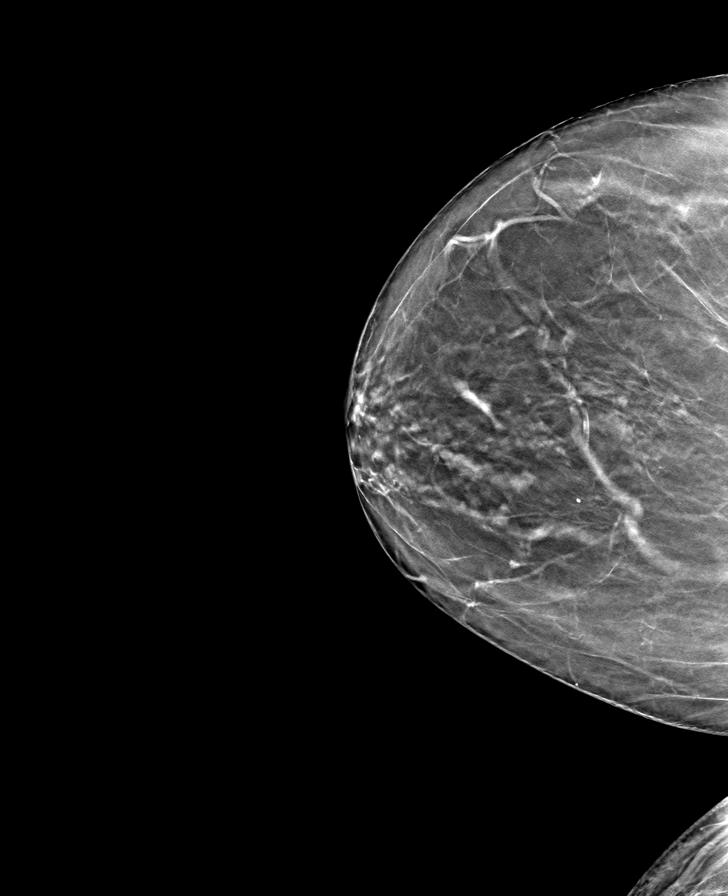

[8 of 24 positions shown; findings below may reference images not displayed]

ACR Breast Density Category b: There are scattered areas of
fibroglandular density.
FINDINGS: There are no findings suspicious for malignancy.
IMPRESSION: No mammographic evidence of malignancy. A result letter of this
screening mammogram will be mailed directly to the patient.

RECOMMENDATION:
Screening mammogram in one year. (Code:51-O-LD2)

BI-RADS CATEGORY  1: Negative.

## 2022-05-09 ENCOUNTER — Other Ambulatory Visit: Payer: Medicare PPO

## 2022-05-15 ENCOUNTER — Ambulatory Visit: Admit: 2022-05-15 | Payer: Medicare PPO | Admitting: Podiatry

## 2022-05-15 SURGERY — BUNIONECTOMY, LAPIDUS
Anesthesia: Choice | Site: Toe | Laterality: Left

## 2022-05-24 ENCOUNTER — Encounter: Payer: Medicare PPO | Admitting: Podiatry

## 2022-06-07 ENCOUNTER — Encounter: Payer: Medicare PPO | Admitting: Podiatry

## 2022-07-10 DIAGNOSIS — J3081 Allergic rhinitis due to animal (cat) (dog) hair and dander: Secondary | ICD-10-CM | POA: Diagnosis not present

## 2022-07-10 DIAGNOSIS — J301 Allergic rhinitis due to pollen: Secondary | ICD-10-CM | POA: Diagnosis not present

## 2022-07-10 DIAGNOSIS — J3089 Other allergic rhinitis: Secondary | ICD-10-CM | POA: Diagnosis not present

## 2022-07-20 DIAGNOSIS — J301 Allergic rhinitis due to pollen: Secondary | ICD-10-CM | POA: Diagnosis not present

## 2022-07-20 DIAGNOSIS — R7309 Other abnormal glucose: Secondary | ICD-10-CM | POA: Diagnosis not present

## 2022-07-20 DIAGNOSIS — M62838 Other muscle spasm: Secondary | ICD-10-CM | POA: Diagnosis not present

## 2022-07-20 DIAGNOSIS — J3089 Other allergic rhinitis: Secondary | ICD-10-CM | POA: Diagnosis not present

## 2022-07-20 DIAGNOSIS — J3081 Allergic rhinitis due to animal (cat) (dog) hair and dander: Secondary | ICD-10-CM | POA: Diagnosis not present

## 2022-07-20 DIAGNOSIS — E559 Vitamin D deficiency, unspecified: Secondary | ICD-10-CM | POA: Diagnosis not present

## 2022-07-20 DIAGNOSIS — I10 Essential (primary) hypertension: Secondary | ICD-10-CM | POA: Diagnosis not present

## 2022-07-20 DIAGNOSIS — N200 Calculus of kidney: Secondary | ICD-10-CM | POA: Insufficient documentation

## 2022-07-20 DIAGNOSIS — R7303 Prediabetes: Secondary | ICD-10-CM | POA: Diagnosis not present

## 2022-07-20 DIAGNOSIS — E039 Hypothyroidism, unspecified: Secondary | ICD-10-CM | POA: Diagnosis not present

## 2022-07-20 HISTORY — DX: Calculus of kidney: N20.0

## 2022-08-01 DIAGNOSIS — N390 Urinary tract infection, site not specified: Secondary | ICD-10-CM | POA: Diagnosis not present

## 2022-08-01 DIAGNOSIS — N952 Postmenopausal atrophic vaginitis: Secondary | ICD-10-CM | POA: Diagnosis not present

## 2022-08-01 DIAGNOSIS — R93421 Abnormal radiologic findings on diagnostic imaging of right kidney: Secondary | ICD-10-CM | POA: Diagnosis not present

## 2022-08-03 DIAGNOSIS — N200 Calculus of kidney: Secondary | ICD-10-CM | POA: Diagnosis not present

## 2022-08-03 DIAGNOSIS — N390 Urinary tract infection, site not specified: Secondary | ICD-10-CM | POA: Diagnosis not present

## 2022-08-03 DIAGNOSIS — N952 Postmenopausal atrophic vaginitis: Secondary | ICD-10-CM | POA: Diagnosis not present

## 2022-08-10 ENCOUNTER — Other Ambulatory Visit (HOSPITAL_BASED_OUTPATIENT_CLINIC_OR_DEPARTMENT_OTHER): Payer: Self-pay | Admitting: Family Medicine

## 2022-08-10 DIAGNOSIS — F411 Generalized anxiety disorder: Secondary | ICD-10-CM | POA: Diagnosis not present

## 2022-08-10 DIAGNOSIS — Z0000 Encounter for general adult medical examination without abnormal findings: Secondary | ICD-10-CM | POA: Diagnosis not present

## 2022-08-10 DIAGNOSIS — R748 Abnormal levels of other serum enzymes: Secondary | ICD-10-CM | POA: Diagnosis not present

## 2022-08-10 DIAGNOSIS — E039 Hypothyroidism, unspecified: Secondary | ICD-10-CM | POA: Diagnosis not present

## 2022-08-10 DIAGNOSIS — I10 Essential (primary) hypertension: Secondary | ICD-10-CM | POA: Diagnosis not present

## 2022-08-10 DIAGNOSIS — Z1331 Encounter for screening for depression: Secondary | ICD-10-CM | POA: Diagnosis not present

## 2022-08-10 DIAGNOSIS — E7800 Pure hypercholesterolemia, unspecified: Secondary | ICD-10-CM | POA: Diagnosis not present

## 2022-08-10 DIAGNOSIS — F325 Major depressive disorder, single episode, in full remission: Secondary | ICD-10-CM | POA: Diagnosis not present

## 2022-08-10 DIAGNOSIS — J3089 Other allergic rhinitis: Secondary | ICD-10-CM | POA: Diagnosis not present

## 2022-08-10 DIAGNOSIS — R413 Other amnesia: Secondary | ICD-10-CM | POA: Diagnosis not present

## 2022-08-10 DIAGNOSIS — R7303 Prediabetes: Secondary | ICD-10-CM | POA: Diagnosis not present

## 2022-08-10 DIAGNOSIS — J301 Allergic rhinitis due to pollen: Secondary | ICD-10-CM | POA: Diagnosis not present

## 2022-08-10 DIAGNOSIS — J3081 Allergic rhinitis due to animal (cat) (dog) hair and dander: Secondary | ICD-10-CM | POA: Diagnosis not present

## 2022-08-11 ENCOUNTER — Ambulatory Visit: Payer: Medicare PPO | Admitting: Pulmonary Disease

## 2022-08-14 ENCOUNTER — Other Ambulatory Visit: Payer: Self-pay | Admitting: Family Medicine

## 2022-08-14 DIAGNOSIS — N200 Calculus of kidney: Secondary | ICD-10-CM | POA: Diagnosis not present

## 2022-08-14 DIAGNOSIS — E2839 Other primary ovarian failure: Secondary | ICD-10-CM

## 2022-08-17 DIAGNOSIS — N958 Other specified menopausal and perimenopausal disorders: Secondary | ICD-10-CM | POA: Diagnosis not present

## 2022-08-17 DIAGNOSIS — R3912 Poor urinary stream: Secondary | ICD-10-CM | POA: Diagnosis not present

## 2022-08-17 DIAGNOSIS — R350 Frequency of micturition: Secondary | ICD-10-CM | POA: Diagnosis not present

## 2022-08-17 DIAGNOSIS — N390 Urinary tract infection, site not specified: Secondary | ICD-10-CM | POA: Diagnosis not present

## 2022-08-17 DIAGNOSIS — R3911 Hesitancy of micturition: Secondary | ICD-10-CM | POA: Diagnosis not present

## 2022-08-17 DIAGNOSIS — K5901 Slow transit constipation: Secondary | ICD-10-CM | POA: Diagnosis not present

## 2022-08-17 DIAGNOSIS — R351 Nocturia: Secondary | ICD-10-CM | POA: Diagnosis not present

## 2022-08-17 DIAGNOSIS — N993 Prolapse of vaginal vault after hysterectomy: Secondary | ICD-10-CM | POA: Diagnosis not present

## 2022-08-17 HISTORY — DX: Urinary tract infection, site not specified: N39.0

## 2022-08-25 DIAGNOSIS — J301 Allergic rhinitis due to pollen: Secondary | ICD-10-CM | POA: Diagnosis not present

## 2022-08-25 DIAGNOSIS — J3081 Allergic rhinitis due to animal (cat) (dog) hair and dander: Secondary | ICD-10-CM | POA: Diagnosis not present

## 2022-08-25 DIAGNOSIS — J3089 Other allergic rhinitis: Secondary | ICD-10-CM | POA: Diagnosis not present

## 2022-09-07 DIAGNOSIS — J301 Allergic rhinitis due to pollen: Secondary | ICD-10-CM | POA: Diagnosis not present

## 2022-09-07 DIAGNOSIS — J3089 Other allergic rhinitis: Secondary | ICD-10-CM | POA: Diagnosis not present

## 2022-09-08 DIAGNOSIS — J3089 Other allergic rhinitis: Secondary | ICD-10-CM | POA: Diagnosis not present

## 2022-09-08 DIAGNOSIS — J301 Allergic rhinitis due to pollen: Secondary | ICD-10-CM | POA: Diagnosis not present

## 2022-09-14 ENCOUNTER — Other Ambulatory Visit (HOSPITAL_BASED_OUTPATIENT_CLINIC_OR_DEPARTMENT_OTHER): Payer: Medicare PPO

## 2022-09-14 ENCOUNTER — Encounter (HOSPITAL_BASED_OUTPATIENT_CLINIC_OR_DEPARTMENT_OTHER): Payer: Self-pay

## 2022-09-20 ENCOUNTER — Ambulatory Visit: Payer: Medicare PPO | Admitting: Podiatry

## 2022-09-20 DIAGNOSIS — J3081 Allergic rhinitis due to animal (cat) (dog) hair and dander: Secondary | ICD-10-CM | POA: Diagnosis not present

## 2022-09-20 DIAGNOSIS — M21619 Bunion of unspecified foot: Secondary | ICD-10-CM | POA: Diagnosis not present

## 2022-09-20 DIAGNOSIS — M2012 Hallux valgus (acquired), left foot: Secondary | ICD-10-CM | POA: Diagnosis not present

## 2022-09-20 DIAGNOSIS — Z01818 Encounter for other preprocedural examination: Secondary | ICD-10-CM | POA: Diagnosis not present

## 2022-09-20 DIAGNOSIS — J3089 Other allergic rhinitis: Secondary | ICD-10-CM | POA: Diagnosis not present

## 2022-09-20 DIAGNOSIS — J301 Allergic rhinitis due to pollen: Secondary | ICD-10-CM | POA: Diagnosis not present

## 2022-09-20 NOTE — Progress Notes (Signed)
Subjective:  Patient ID: Yolanda Vargas, female    DOB: 17-May-1950,  MRN: [id number]  Chief Complaint  Patient presents with   Hav (hallux abducto valgus), left    72 y.o. female presents with the above complaint.  Patient presents with complaint left severe bunion deformity.  She still continues to have pain.  She is doing shoe gear modification and has failed all conservative care.  She would like to discuss surgical option.  Review of Systems: Negative except as noted in the HPI. Denies N/V/F/Ch.  Past Medical History:  Diagnosis Date   Abnormal Pap smear of cervix    Asthma    Hypertension    Osteopenia    Thyroid goiter     Current Outpatient Medications:    albuterol (VENTOLIN HFA) 108 (90 Base) MCG/ACT inhaler, Inhale 2 puffs by inhalation route., Disp: , Rfl:    amLODipine (NORVASC) 5 MG tablet, , Disp: , Rfl:    budesonide-formoterol (SYMBICORT) 160-4.5 MCG/ACT inhaler, Inhale 2 puffs into the lungs 2 (two) times daily., Disp: 10.2 g, Rfl: 6   chlorthalidone (HYGROTON) 25 MG tablet, Take 12.5 mg by mouth every morning., Disp: , Rfl:    COMIRNATY SUSP injection, Inject into the muscle., Disp: , Rfl:    escitalopram (LEXAPRO) 10 MG tablet, , Disp: , Rfl:    fluticasone (FLONASE) 50 MCG/ACT nasal spray, Place 2 sprays into both nostrils daily. (Patient not taking: Reported on 09/29/2021), Disp: 16 g, Rfl: 3   levothyroxine (SYNTHROID) 88 MCG tablet, Take 75 mcg by mouth daily., Disp: , Rfl:    Mepolizumab (NUCALA) 100 MG/ML SOAJ, Inject 1 mL (100 mg total) into the skin every 28 (twenty-eight) days., Disp: 1 mL, Rfl: 5   montelukast (SINGULAIR) 10 MG tablet, , Disp: , Rfl:    pantoprazole (PROTONIX) 40 MG tablet, Take 1 tablet (40 mg total) by mouth daily., Disp: 30 tablet, Rfl: 3   rosuvastatin (CRESTOR) 10 MG tablet, , Disp: , Rfl:   Social History   Tobacco Use  Smoking Status Never  Smokeless Tobacco Never    Allergies  Allergen Reactions   Diltiazem Hcl Other  (See Comments)    Stoke like symptoms    Penicillins Rash   Doxycycline Rash   Hydrocodone Palpitations   Lisinopril Cough and Other (See Comments)    Other reaction(s): Cough    Objective:   There were no vitals filed for this visit.  There is no height or weight on file to calculate BMI. Constitutional Well developed. Well nourished.  Vascular Dorsalis pedis pulses palpable bilaterally. Posterior tibial pulses palpable bilaterally. Capillary refill normal to all digits.  No cyanosis or clubbing noted. Pedal hair growth normal.  Neurologic Normal speech. Oriented to person, place, and time. Epicritic sensation to light touch grossly present bilaterally.  Dermatologic Nails well groomed and normal in appearance. No open wounds. No skin lesions.  Orthopedic: Normal joint ROM without pain or crepitus bilaterally. Hallux abductovalgus deformity present there is a track bound not a tracking deformity no intra-articular first MPJ noted. Left 1st MPJ diminished range of motion. Left 1st TMT with gross hypermobility. Right 1st MPJ diminished range of motion  Right 1st TMT without gross hypermobility. Lesser digital contractures absent bilaterally.   Radiographs: Taken and reviewed. Hallux abductovalgus deformity present. Metatarsal parabola normal. 1st/2nd IMA: Severe; TSP: 6 out of 7  Assessment:   1. Hav (hallux abducto valgus), left   2. Bunion   3. Encounter for preoperative examination for general surgical  procedure    Plan:  Patient was evaluated and treated and all questions answered.  Hallux abductovalgus deformity, left -XR as above. -Patient has failed all conservative therapy and wishes to proceed with surgical intervention. All risks, benefits, and alternatives discussed with patient. No guarantees given. Consent reviewed and signed by patient. Post-op course explained at length. -Planned procedures: Left Lapiplasty/Lapidus fusion with possible phalangeal  osteotomy -Risk factors: Age -I discussed my preoperative intraoperative sonogram with the patient extensive detail she states understanding like to proceed with surgery. -Informed surgical risk consent was reviewed and read aloud to the patient.  I reviewed the films.  I have discussed my findings with the patient in great detail.  I have discussed all risks including but not limited to infection, stiffness, scarring, limp, disability, deformity, damage to blood vessels and nerves, numbness, poor healing, need for braces, arthritis, chronic pain, amputation, death.  All benefits and realistic expectations discussed in great detail.  I have made no promises as to the outcome.  I have provided realistic expectations.  I have offered the patient a 2nd opinion, which they have declined and assured me they preferred to proceed despite the risks   No follow-ups on file.

## 2022-09-21 DIAGNOSIS — K5900 Constipation, unspecified: Secondary | ICD-10-CM | POA: Diagnosis not present

## 2022-09-21 DIAGNOSIS — N952 Postmenopausal atrophic vaginitis: Secondary | ICD-10-CM | POA: Diagnosis not present

## 2022-09-21 DIAGNOSIS — N8111 Cystocele, midline: Secondary | ICD-10-CM | POA: Diagnosis not present

## 2022-09-21 DIAGNOSIS — Z4689 Encounter for fitting and adjustment of other specified devices: Secondary | ICD-10-CM | POA: Diagnosis not present

## 2022-10-03 ENCOUNTER — Ambulatory Visit: Payer: Medicare PPO | Admitting: Pulmonary Disease

## 2022-10-03 DIAGNOSIS — J3089 Other allergic rhinitis: Secondary | ICD-10-CM | POA: Diagnosis not present

## 2022-10-03 DIAGNOSIS — J3081 Allergic rhinitis due to animal (cat) (dog) hair and dander: Secondary | ICD-10-CM | POA: Diagnosis not present

## 2022-10-03 DIAGNOSIS — J301 Allergic rhinitis due to pollen: Secondary | ICD-10-CM | POA: Diagnosis not present

## 2022-10-03 DIAGNOSIS — G5601 Carpal tunnel syndrome, right upper limb: Secondary | ICD-10-CM | POA: Insufficient documentation

## 2022-10-03 HISTORY — DX: Carpal tunnel syndrome, right upper limb: G56.01

## 2022-10-04 DIAGNOSIS — L819 Disorder of pigmentation, unspecified: Secondary | ICD-10-CM | POA: Diagnosis not present

## 2022-10-12 DIAGNOSIS — J3081 Allergic rhinitis due to animal (cat) (dog) hair and dander: Secondary | ICD-10-CM | POA: Diagnosis not present

## 2022-10-12 DIAGNOSIS — J301 Allergic rhinitis due to pollen: Secondary | ICD-10-CM | POA: Diagnosis not present

## 2022-10-12 DIAGNOSIS — J3089 Other allergic rhinitis: Secondary | ICD-10-CM | POA: Diagnosis not present

## 2022-10-12 DIAGNOSIS — T781XXA Other adverse food reactions, not elsewhere classified, initial encounter: Secondary | ICD-10-CM | POA: Diagnosis not present

## 2022-10-12 DIAGNOSIS — J4540 Moderate persistent asthma, uncomplicated: Secondary | ICD-10-CM | POA: Diagnosis not present

## 2022-11-01 ENCOUNTER — Ambulatory Visit (INDEPENDENT_AMBULATORY_CARE_PROVIDER_SITE_OTHER): Payer: Medicare PPO

## 2022-11-01 ENCOUNTER — Ambulatory Visit: Payer: Medicare PPO | Admitting: Podiatry

## 2022-11-01 ENCOUNTER — Encounter: Payer: Self-pay | Admitting: Podiatry

## 2022-11-01 DIAGNOSIS — M2012 Hallux valgus (acquired), left foot: Secondary | ICD-10-CM

## 2022-11-01 DIAGNOSIS — M79672 Pain in left foot: Secondary | ICD-10-CM

## 2022-11-01 DIAGNOSIS — M76822 Posterior tibial tendinitis, left leg: Secondary | ICD-10-CM | POA: Diagnosis not present

## 2022-11-01 DIAGNOSIS — M21612 Bunion of left foot: Secondary | ICD-10-CM

## 2022-11-01 MED ORDER — MELOXICAM 7.5 MG PO TABS: 7.5000 mg | ORAL_TABLET | Freq: Every day | ORAL | 0 refills | Status: DC

## 2022-11-01 NOTE — Progress Notes (Signed)
Chief Complaint  Patient presents with   Flat Foot    Pain along L PT tendon going to arch and midfoot approximately 4 weeks. Denies any specific injury or trauma. Uses stationary bike. Has upcoming plans for bunion surgery with Dr. Allena Katz likely in November   HPI: 72 y.o. female presents today with c/o pain in the left medial midfoot.  States she had hip bursitis and took 6 weeks off from riding her Peloton bike, but when she resumed after taking that time off, she hadn't decreased her riding time or intensity.  When riding, she didn't realize her bike seat was slowly sliding down and then developed pain in the left foot.  It has not improved.  She is scheduled for Lapiplasty of the left foot by Dr. Allena Katz in December.   Past Medical History:  Diagnosis Date   Abnormal Pap smear of cervix    Asthma    Hypertension    Osteopenia    Thyroid goiter     Past Surgical History:  Procedure Laterality Date   BREAST BIOPSY Left    2018   CERVICAL CONE BIOPSY     GALLBLADDER SURGERY     HYSTEROTOMY  2020    Allergies  Allergen Reactions   Diltiazem Hcl Other (See Comments)    Stoke like symptoms  Stoke like symptoms     Stoke like symptoms   Penicillins Rash   Doxycycline Rash   Hydrocodone Palpitations   Lisinopril Cough and Other (See Comments)    Other reaction(s): Cough    Physical Exam: General: The patient is alert and oriented x3 in no acute distress.  Dermatology: Skin is warm, dry and supple bilateral lower extremities. Interspaces are clear of maceration and debris.    Vascular: Palpable pedal pulses bilaterally. Capillary refill within normal limits.    No erythema or calor.  Neurological: Light touch sensation grossly intact bilateral feet.   Musculoskeletal Exam: POP posterior tibial tendon from medial malleolus to insertion into navicular.  Insertion point is the most tender.  No gaps or nodules noted.  Minimal edema noted at insertion area.  Resisted  supination of foot causes some pain in area.   Radiographic Exam (right foot, 3wb views, 11/01/22):  Normal osseous mineralization. Significant increased first IM angle and hallux abductus angle right foot.  No fracture seen.   Assessment/Plan of Care: 1. Posterior tibial tendonitis, left   2. Left foot pain   3. Hallux valgus with bunions, left      Meds ordered this encounter  Medications   meloxicam (MOBIC) 7.5 MG tablet    Sig: Take 1 tablet (7.5 mg total) by mouth daily.    Dispense:  10 tablet    Refill:  0   Discussed clinical and radiographic findings with patient today.  Elastic ankle sleeve dispensed to wear when WB / working out, left foot.  Recommended Currex cycling insoles for support in her cycling shoes.  She can continue with low resistance cycling without standing and pedaling for now.  Limit riding to 30 - 45 minutes over the next couple of week.  Powerstep arch supports fitted and dispensed.  Wear these in all shoes and avoid going barefoot or in non-supportive shoes.   Rx Meloxicam  F/u 4 weeks.   Clerance Lav, DPM, FACFAS [facility]     [address]  [place], [place] [address]                Office [phone]  Fax [phone]

## 2022-11-03 DIAGNOSIS — K5900 Constipation, unspecified: Secondary | ICD-10-CM | POA: Diagnosis not present

## 2022-11-03 DIAGNOSIS — J301 Allergic rhinitis due to pollen: Secondary | ICD-10-CM | POA: Diagnosis not present

## 2022-11-03 DIAGNOSIS — J3089 Other allergic rhinitis: Secondary | ICD-10-CM | POA: Diagnosis not present

## 2022-11-03 DIAGNOSIS — Z1211 Encounter for screening for malignant neoplasm of colon: Secondary | ICD-10-CM | POA: Diagnosis not present

## 2022-11-03 DIAGNOSIS — R140 Abdominal distension (gaseous): Secondary | ICD-10-CM | POA: Diagnosis not present

## 2022-11-03 DIAGNOSIS — J3081 Allergic rhinitis due to animal (cat) (dog) hair and dander: Secondary | ICD-10-CM | POA: Diagnosis not present

## 2022-11-03 DIAGNOSIS — R110 Nausea: Secondary | ICD-10-CM | POA: Diagnosis not present

## 2022-11-22 ENCOUNTER — Ambulatory Visit: Payer: Medicare PPO | Admitting: Podiatry

## 2022-12-04 ENCOUNTER — Other Ambulatory Visit: Payer: Self-pay

## 2022-12-04 ENCOUNTER — Ambulatory Visit: Payer: Medicare PPO | Admitting: Orthopedic Surgery

## 2022-12-04 DIAGNOSIS — M25531 Pain in right wrist: Secondary | ICD-10-CM

## 2022-12-04 NOTE — Progress Notes (Signed)
Yolanda Vargas - 72 y.o. female MRN [id number]  Date of birth: 1950-06-20  Office Visit Note: Visit Date: 12/04/2022 PCP: Wilfrid Lund, PA Referred by: Wilfrid Lund, PA  Subjective: No chief complaint on file.  HPI: Yolanda Vargas is a pleasant 72 y.o. female who presents today for evaluation of ongoing right hand numbness and tingling that is been present now for multiple years, worsening in nature.  She states that the numbness and tingling affects the radial sided digits primarily, is associated with nocturnal symptoms as well.  She has noted increasing muscle loss in the hand as well with associated weakness.  Pertinent ROS were reviewed with the patient and found to be negative unless otherwise specified above in HPI.   Visit Reason: right hand numbness and tingling Duration of symptoms: years Hand dominance: right Occupation: Retired Diabetic: No Smoking: No Heart/Lung History: asthma Blood Thinners:  none  Prior Testing/EMG: none Injections (Date): none Treatments:none Prior Surgery: none   Assessment & Plan: Visit Diagnoses:  1. Pain in right wrist     Plan: Extensive discussion was had with the patient today about her likely ongoing right sided carpal tunnel syndrome that is quite severe based on clinical examination.  I would like to obtain electrodiagnostic evidence to confirm this diagnosis and set a baseline preoperatively.  Given the notable thenar atrophy, I did recommend that we move forward surgical intervention should the electrodiagnostic study show significant nerve compression.  Electrodiagnostic study testing will be completed next week.  She would like to have the potential surgery done before the end of the year which we should be able to accommodate.  At this juncture, she is indicated for right open versus endoscopic carpal tunnel release.  Risks and benefits of both operations were discussed in detail today.  Understanding all risks and benefits,  patient would like to have surgery done in the form of right open carpal tunnel release under local anesthesia.  Risks include but not limited to infection, bleeding, scarring, stiffness, nerve injury or vascular, tendon injury, risk of recurrence and need for subsequent operation were all discussed in detail.  Patient consented understanding the above.  Will move forward surgical scheduling of right open carpal tunnel release under local anesthesia.   Follow-up: No follow-ups on file.   Meds & Orders: No orders of the defined types were placed in this encounter.   Orders Placed This Encounter  Procedures   XR Wrist Complete Right   Ambulatory referral to Physical Medicine Rehab     Procedures: No procedures performed      Clinical History: No specialty comments available.  She reports that she has never smoked. She has never used smokeless tobacco. No results for input(s): "HGBA1C", "LABURIC" in the last 8760 hours.  Objective:   Vital Signs: There were no vitals taken for this visit.  Physical Exam  Gen: Well-appearing, in no acute distress; non-toxic CV: Regular Rate. Well-perfused. Warm.  Resp: Breathing unlabored on room air; no wheezing. Psych: Fluid speech in conversation; appropriate affect; normal thought process  Ortho Exam PHYSICAL EXAM:  General: Patient is well appearing and in no distress. Cervical spine mobility is full in all directions:  Skin and Muscle: No significant skin changes are apparent to upper extremities.   Range of Motion and Palpation Tests: Mobility is full about the elbows with flexion and extension. Forearm supination and pronation are 85/85 bilaterally.  Wrist flexion/extension is 75/65 bilaterally.  Digital flexion and extension are  full.  Thumb opposition is full to the base of the small fingers bilaterally.    Neurologic, Vascular, Motor: Sensation is diminished to light touch in the right median nerve distribution.  Tinel's testing  positive right carpal tunnel Phalen's positive right, Derkan's compression positive right Thenar atrophy: Significant right side APB: 4/5 right side  Fingers pink and well perfused.  Capillary refill is brisk.     No results found for: "HGBA1C"   Imaging: No results found.  Past Medical/Family/Surgical/Social History: Medications & Allergies reviewed per EMR, new medications updated. Patient Active Problem List   Diagnosis Date Noted   Excessive vitamin B12 intake 06/02/2021   Osteopenia 09/11/2017   Generalized anxiety disorder 03/05/2017   Gastroesophageal reflux disease without esophagitis 12/04/2016   Benign hypertension 04/26/2016   Moderate persistent asthma 08/31/2015   Arthropathy 09/29/2014   Dysthymia 02/24/2014   Hypothyroidism 02/24/2014   Perimenopausal disorder 02/24/2014   Seasonal allergic rhinitis 02/24/2014   Raynaud's phenomenon 12/30/2008   Dyspepsia 03/25/1997   Past Medical History:  Diagnosis Date   Abnormal Pap smear of cervix    Asthma    Hypertension    Osteopenia    Thyroid goiter    Family History  Problem Relation Age of Onset   Breast cancer Mother 26   Diabetes Father    Cancer Sister        OVARIAN   Heart disease Brother    Past Surgical History:  Procedure Laterality Date   BREAST BIOPSY Left    2018   CERVICAL CONE BIOPSY     GALLBLADDER SURGERY     HYSTEROTOMY  2020   Social History   Occupational History   Not on file  Tobacco Use   Smoking status: Never   Smokeless tobacco: Never  Vaping Use   Vaping status: Never Used  Substance and Sexual Activity   Alcohol use: Not Currently   Drug use: Never   Sexual activity: Not Currently    Partners: Male    Birth control/protection: Surgical    Comment: first sexual encounter at 65, less than 5, hysterectomy    Latyra Jaye Fara Boros) Joseangel Nettleton, M.D. [facility] 2:16 PM

## 2022-12-06 ENCOUNTER — Encounter: Payer: Self-pay | Admitting: Pulmonary Disease

## 2022-12-06 ENCOUNTER — Telehealth: Payer: Self-pay | Admitting: Pulmonary Disease

## 2022-12-06 ENCOUNTER — Ambulatory Visit: Payer: Medicare PPO | Admitting: Pulmonary Disease

## 2022-12-06 VITALS — BP 122/76 | HR 74 | Temp 97.8°F | Ht 67.5 in | Wt 162.2 lb

## 2022-12-06 DIAGNOSIS — J3089 Other allergic rhinitis: Secondary | ICD-10-CM | POA: Diagnosis not present

## 2022-12-06 DIAGNOSIS — J301 Allergic rhinitis due to pollen: Secondary | ICD-10-CM | POA: Diagnosis not present

## 2022-12-06 DIAGNOSIS — J4540 Moderate persistent asthma, uncomplicated: Secondary | ICD-10-CM

## 2022-12-06 DIAGNOSIS — J8283 Eosinophilic asthma: Secondary | ICD-10-CM

## 2022-12-06 DIAGNOSIS — J3081 Allergic rhinitis due to animal (cat) (dog) hair and dander: Secondary | ICD-10-CM | POA: Diagnosis not present

## 2022-12-06 LAB — CBC WITH DIFFERENTIAL/PLATELET
Basophils Absolute: 0.1 10*3/uL (ref 0.0–0.1)
Basophils Relative: 1.4 % (ref 0.0–3.0)
Eosinophils Absolute: 0.1 10*3/uL (ref 0.0–0.7)
Eosinophils Relative: 2.5 % (ref 0.0–5.0)
HCT: 46.7 % — ABNORMAL HIGH (ref 36.0–46.0)
Hemoglobin: 15.8 g/dL — ABNORMAL HIGH (ref 12.0–15.0)
Lymphocytes Relative: 24.5 % (ref 12.0–46.0)
Lymphs Abs: 1.4 10*3/uL (ref 0.7–4.0)
MCHC: 33.9 g/dL (ref 30.0–36.0)
MCV: 99.5 fL (ref 78.0–100.0)
Monocytes Absolute: 0.5 10*3/uL (ref 0.1–1.0)
Monocytes Relative: 8.5 % (ref 3.0–12.0)
Neutro Abs: 3.6 10*3/uL (ref 1.4–7.7)
Neutrophils Relative %: 63.1 % (ref 43.0–77.0)
Platelets: 221 10*3/uL (ref 150.0–400.0)
RBC: 4.7 Mil/uL (ref 3.87–5.11)
RDW: 12.7 % (ref 11.5–15.5)
WBC: 5.8 10*3/uL (ref 4.0–10.5)

## 2022-12-06 MED ORDER — BUDESONIDE-FORMOTEROL FUMARATE 160-4.5 MCG/ACT IN AERO: 2.0000 | INHALATION_SPRAY | Freq: Two times a day (BID) | RESPIRATORY_TRACT | 6 refills | Status: AC

## 2022-12-06 MED ORDER — ALBUTEROL SULFATE HFA 108 (90 BASE) MCG/ACT IN AERS: 2.0000 | INHALATION_SPRAY | Freq: Four times a day (QID) | RESPIRATORY_TRACT | 11 refills | Status: DC | PRN

## 2022-12-06 MED ORDER — FLUTICASONE PROPIONATE 50 MCG/ACT NA SUSP: 1.0000 | Freq: Every day | NASAL | 1 refills | Status: AC

## 2022-12-06 NOTE — Telephone Encounter (Signed)
Patient dropped off AZ&ME applications. Placed in pharm box

## 2022-12-06 NOTE — Progress Notes (Signed)
@Patient  ID: Yolanda Vargas, female    DOB: 1950/08/16, 72 y.o.   MRN: [id number]  Chief Complaint  Patient presents with   Follow-up    SOB and dyspnea persistent    Referring provider: Wilfrid Lund, PA  HPI:   72 y.o. with history of asthma here for evaluation of the same.  Multiple notes in the interim reviewed.  Patient returns overdue for follow-up.  Last seen 15 months ago requested 75-month follow-up.  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  Notably Fasenra in the past been beneficial but she had dry mouth so this was stopped.  She remains on high-dose Symbicort.  Unfortunately after Nucala she had some acute side effects mainly musculoskeletal etc. may be some mild worsening of breathing.  So this was discontinued.  She is never followed up since.  She can use to have issues with cough and congestion.  Reportedly endoscopy without signs of GERD.  She notes when she stops her Symbicort her symptoms worsen.  She allows a lot of nasal congestion and allergy symptoms as well.  Multiple questions answered to the best of my ability.  HPI initial visit: Patient states that in 1999 she was bitten by a tick.  Since then she has had a series of medical abnormalities and symptoms she relates back to that.  She is concerned about likelihood of having chronic Lyme disease.  Symptoms include stomach upset, GI issues.  In terms of respiratory issues, she states that since that time she has had some shortness of breath.  In terms of exertional issues, she can exert self and exercise okay.  However at rest and sometimes with exertion she has a sensation of difficulty getting a deep breath.  Heaviness or pressure or tightness in her chest.  Symptoms have lingered and been stable these past 20+ years.  Some associated cough.  She is tried multiple different medications for this.  She inhalers helped some.  Currently on Symbicort.  Use albuterol as needed  with some mild improvement at times.  Was previously on Fasenra.  She felt this did not help to chest tightness or heaviness.  However she had some other side effects, not liking the way it made her felt etc.  This was stopped.  The side effects seem to improve.  She is currently undergoing allergy shots.  Recently resumed these.  In effort to help symptoms.  She had a chest x-ray 10/2019 personally reviewed and interpreted as clear lungs with hyperinflation.  She had pulmonary function test in 2016 and chart was reviewed and showed spirometry with significant bronchodilator response over 200 cc and 13% increase in FEV1.  She had a repeat PFT here in [facility] 12/2019 at [facility] pulmonary they are all normal, see below for full interpretation.   Questionaires / Pulmonary Flowsheets:   ACT:  Asthma Control Test ACT Total Score  09/29/2021 11:10 AM 17  12/08/2019 10:20 AM 18  10/07/2019 10:51 AM 16    MMRC:     No data to display          Epworth:      No data to display          Tests:   FENO:  No results found for: "NITRICOXIDE"  PFT:    Latest Ref Rng & Units 12/08/2019    9:02 AM 09/06/2016    9:39 AM  PFT Results  FVC-Pre L 3.40    FVC-Predicted  Pre % 98    FVC-Post L 3.48    FVC-Predicted Post % 100    Pre FEV1/FVC % % 75    Post FEV1/FCV % % 77    FEV1-Pre L 2.56    FEV1-Predicted Pre % 97    FEV1-Post L 2.66    DLCO uncorrected ml/min/mmHg 22.75    DLCO UNC% % 103    DLCO corrected ml/min/mmHg 22.75    DLCO COR %Predicted % 103    DLVA Predicted % 101    TLC L 5.95  1.74      TLC % Predicted % 106    RV % Predicted % 108       This result is from an external source.  2021 PFTs personally reviewed and interpreted as normal spirometry, no bronchodilator spots, lung volumes within normal limits, DLCO within normal limits, flow volume loop looks mildly obstructed 2016 spirometry from [facility] with significant bronchodilator response in  FEV1  WALK:      No data to display          Imaging: Personally reviewed and as per EMR and discussion in this note No results found.  Lab Results: Personally reviewed CBC    Component Value Date/Time   WBC 5.7 12/19/2021 1142   WBC 6.9 10/07/2019 1130   RBC 4.33 12/19/2021 1142   RBC 4.38 10/07/2019 1130   HGB 14.2 12/19/2021 1142   HCT 42.2 12/19/2021 1142   PLT 227 12/19/2021 1142   MCV 98 (H) 12/19/2021 1142   MCH 32.8 12/19/2021 1142   MCHC 33.6 12/19/2021 1142   MCHC 33.5 10/07/2019 1130   RDW 11.3 (L) 12/19/2021 1142   LYMPHSABS 2.0 12/19/2021 1142   MONOABS 0.5 10/07/2019 1130   EOSABS 0.6 (H) 12/19/2021 1142   BASOSABS 0.1 12/19/2021 1142    BMET No results found for: "NA", "K", "CL", "CO2", "GLUCOSE", "BUN", "CREATININE", "CALCIUM", "GFRNONAA", "GFRAA"  BNP No results found for: "BNP"  ProBNP No results found for: "PROBNP"  Specialty Problems       Pulmonary Problems   Seasonal allergic rhinitis   Moderate persistent asthma    Mellody Dance       Allergies  Allergen Reactions   Diltiazem Hcl Other (See Comments)    Stoke like symptoms  Stoke like symptoms     Stoke like symptoms   Penicillins Rash   Doxycycline Rash   Hydrocodone Palpitations   Lisinopril Cough and Other (See Comments)    Other reaction(s): Cough     Immunization History  Administered Date(s) Administered   Influenza Split 12/15/2019   Influenza, High Dose Seasonal PF 12/04/2016   Influenza,inj,Quad PF,6+ Mos 09/16/2018   Influenza,inj,quad, With Preservative 09/02/2017   PFIZER(Purple Top)SARS-COV-2 Vaccination 02/08/2019, 03/01/2019, 10/02/2019   Pneumococcal Conjugate-13 05/13/2015   Pneumococcal Polysaccharide-23 05/04/2014   Tdap 08/09/2009, 06/20/2019   Zoster Recombinant(Shingrix) 12/24/2017, 02/21/2018   Zoster, Live 08/13/2017    Past Medical History:  Diagnosis Date   Abnormal Pap smear of cervix    Asthma    Hypertension    Osteopenia     Thyroid goiter     Tobacco History: Social History   Tobacco Use  Smoking Status Never  Smokeless Tobacco Never   Counseling given: Not Answered   Continue to not smoke  Outpatient Encounter Medications as of 12/06/2022  Medication Sig   amLODipine (NORVASC) 5 MG tablet    chlorthalidone (HYGROTON) 25 MG tablet Take 12.5 mg by mouth every morning.   COMIRNATY  SUSP injection Inject into the muscle.   escitalopram (LEXAPRO) 10 MG tablet    levothyroxine (SYNTHROID) 88 MCG tablet Take 75 mcg by mouth daily.   meloxicam (MOBIC) 7.5 MG tablet Take 1 tablet (7.5 mg total) by mouth daily.   montelukast (SINGULAIR) 10 MG tablet    pantoprazole (PROTONIX) 40 MG tablet Take 1 tablet (40 mg total) by mouth daily.   rosuvastatin (CRESTOR) 10 MG tablet    [DISCONTINUED] albuterol (VENTOLIN HFA) 108 (90 Base) MCG/ACT inhaler Inhale 2 puffs by inhalation route.   [DISCONTINUED] budesonide-formoterol (SYMBICORT) 160-4.5 MCG/ACT inhaler Inhale 2 puffs into the lungs 2 (two) times daily.   [DISCONTINUED] fluticasone (FLONASE) 50 MCG/ACT nasal spray Place 2 sprays into both nostrils daily.   albuterol (VENTOLIN HFA) 108 (90 Base) MCG/ACT inhaler Inhale 2 puffs into the lungs every 6 (six) hours as needed for wheezing or shortness of breath.   budesonide-formoterol (SYMBICORT) 160-4.5 MCG/ACT inhaler Inhale 2 puffs into the lungs 2 (two) times daily.   fluticasone (FLONASE) 50 MCG/ACT nasal spray Place 1 spray into both nostrils daily.   [DISCONTINUED] Mepolizumab (NUCALA) 100 MG/ML SOAJ Inject 1 mL (100 mg total) into the skin every 28 (twenty-eight) days. (Patient not taking: Reported on 12/06/2022)   No facility-administered encounter medications on file as of 12/06/2022.     Review of Systems  Review of Systems  N/a Physical Exam  BP 122/76 (BP Location: Left Arm, Patient Position: Sitting, Cuff Size: Normal)   Pulse 74   Temp 97.8 F (36.6 C) (Oral)   Ht 5' 7.5" (1.715 m)   Wt 162 lb  3.2 oz (73.6 kg)   SpO2 96%   BMI 25.03 kg/m   Wt Readings from Last 5 Encounters:  12/06/22 162 lb 3.2 oz (73.6 kg)  09/29/21 156 lb 3.2 oz (70.9 kg)  04/22/21 165 lb (74.8 kg)  04/02/20 166 lb (75.3 kg)  12/08/19 158 lb 9.6 oz (71.9 kg)    BMI Readings from Last 5 Encounters:  12/06/22 25.03 kg/m  09/29/21 24.10 kg/m  04/22/21 26.43 kg/m  04/02/20 25.24 kg/m  12/08/19 24.47 kg/m     Physical Exam General: Sitting in chair, no acute distress Eyes: EOMI, no icterus Neck: Supple, no JVP Pulmonary: Clear, normal work of breathing Cardiovascular: Warm, no edema Abdomen: Nondistended, bowel sounds present MSK: No synovitis, no joint effusion Neuro: Normal gait, no weakness Psych: Normal mood, full affect   Assessment & Plan:   Dyspnea, or sensation of difficulty getting deep breaths:   Some improvement on Fasenra in the past.  This was stopped due to perceived side effects however.  Fortunately, PFTs 12/2019 were all normal.  PFTs in 2016 in [place] did show significant bronchodilator response in FEV1.  Suspect largely driven by poorly controlled asthma and element of deconditioning.  Severe persistent asthma with prior PFTs normal with evidence of obstruction on flow volume loop: With ongoing symptoms of chest discomfort, sensation not getting deep breath.  Exertional dyspnea seems okay.  Some cough as well.  Escalate Symbicort to [facility] for additional bronchodilation to see if helps with symptoms.  No improvement now back on Symbicort.  Started on Nucala with eosinophil count of 600 12/2021.  She had acute side effects and this was stopped.  Mild side effects with Harrington Challenger as described above, dry mouth.  However she does relay that Harrington Challenger was the most beneficial in terms of helping her cough and congestion and shortness of breath.  Continue Symbicort refilled today, albuterol  refilled today.  Paperwork for [employer] today, lab work today assuming eosinophils will be  elevated we will resume Harrington Challenger in the coming weeks.  Nasal allergy/rhinitis/postnasal drip: Start Flonase 1 spray each nostril daily.  Start Allegra 1 tablet daily.     Return in about 3 months (around 03/06/2023) for f/u Dr. Judeth Horn.   Karren Burly, MD 12/06/2022   This appointment required 42 minutes of patient care (this includes precharting, chart review, review of results, face-to-face care, etc.).

## 2022-12-06 NOTE — Patient Instructions (Signed)
Continue all inhalers  Use Flonase 1 spray each nostril daily to see if this helps with the nasal congestion and cough  Lab work today and assuming the eosinophil count remains elevated we will start medicines as below  I think is worth resuming the Harrington Challenger given that was the most beneficial for your symptoms, paperwork today  Return to clinic in 3 months or sooner as needed with Dr. Judeth Horn

## 2022-12-08 ENCOUNTER — Telehealth: Payer: Self-pay | Admitting: Pharmacist

## 2022-12-08 NOTE — Telephone Encounter (Signed)
Paperwork placed in "PAP Pending" folder

## 2022-12-08 NOTE — Telephone Encounter (Signed)
Received. Will start Fasenra BIV

## 2022-12-08 NOTE — Telephone Encounter (Signed)
Plan is to restart Fasenra. Will need to reload. Most recent CBC on 12/06/2022 showed absolute eos count of 100. Patient may not qualify for IL-5 inhibitor treatment  Submitted a Prior Authorization request to [facility] for [facility] via CoverMyMeds. Will update once we receive a response.  Key: Regina Eck, PharmD, MPH, BCPS, CPP Clinical Pharmacist (Rheumatology and Pulmonology)

## 2022-12-12 ENCOUNTER — Other Ambulatory Visit (HOSPITAL_COMMUNITY): Payer: Self-pay

## 2022-12-12 DIAGNOSIS — J3089 Other allergic rhinitis: Secondary | ICD-10-CM | POA: Diagnosis not present

## 2022-12-12 DIAGNOSIS — D751 Secondary polycythemia: Secondary | ICD-10-CM | POA: Diagnosis not present

## 2022-12-12 DIAGNOSIS — J301 Allergic rhinitis due to pollen: Secondary | ICD-10-CM | POA: Diagnosis not present

## 2022-12-12 DIAGNOSIS — J3081 Allergic rhinitis due to animal (cat) (dog) hair and dander: Secondary | ICD-10-CM | POA: Diagnosis not present

## 2022-12-12 NOTE — Telephone Encounter (Signed)
Received notification from [facility] regarding a prior authorization for [facility]. Authorization has been APPROVED from 12/08/22 to 01/02/24. Approval letter sent to scan center.  Per test claim, copay for 28 days supply is $100  Patient can fill through [facility]: [phone]   Submitted Patient Assistance Application to [employer] for [facility] along with provider portion, patient portion, PA, medication list, insurance card copy. Will update patient when we receive a response.  Phone #: [phone] Fax #: [phone]

## 2022-12-14 ENCOUNTER — Telehealth: Payer: Self-pay | Admitting: Physical Medicine and Rehabilitation

## 2022-12-14 ENCOUNTER — Telehealth: Payer: Self-pay | Admitting: Orthopedic Surgery

## 2022-12-14 NOTE — Telephone Encounter (Signed)
Received a fax from  [employer] regarding an approval for [facility] patient assistance from 12/13/2022 to 01/02/2024. Approval letter sent to scan center.  Phone #: [phone] Fax #: [phone]  Patient ID: [id number]  Patient can be scheduled for Fasenra new start  Chesley Mires, PharmD, MPH, BCPS, CPP Clinical Pharmacist (Rheumatology and Pulmonology)

## 2022-12-14 NOTE — Telephone Encounter (Signed)
Patient called needing to cancel her appointment due to receiving some bad news.  Patient said she just got news to contact the cancer center.

## 2022-12-14 NOTE — Telephone Encounter (Signed)
FYI: Patient left a message stating she needs to cancel her CTR on 12/19. She got a call from her GP today informing her she is being referred to the Cancer Center. She will call back to reschedule when available.

## 2022-12-15 ENCOUNTER — Telehealth: Payer: Self-pay | Admitting: Podiatry

## 2022-12-15 ENCOUNTER — Encounter: Payer: Medicare PPO | Admitting: Physical Medicine and Rehabilitation

## 2022-12-15 NOTE — Telephone Encounter (Signed)
DOS- 01/15/23  AIKEN OSTEOTOMY LT- 19147 LAPIDUS PROCEDURE INCLUDING BUNIONECTOMY [id number]  [employer] EFFECTIVE DATE- 01/03/16  DEDUCTIBLE- $150.00 WITH REMAINING $0.00 OOP-$1350.00 WITH REMAINING  $500.00  COINSURANCE- 0%  PER THE [place] WEBSITE PORTAL, PRIOR AUTH HAS BEEN APPROVED FOR CPT CODES 30865 AND [id number]. GOOD FROM 01/15/23 - 03/15/2023  Authorization #[id number]  Tracking #[id number]

## 2022-12-18 NOTE — Telephone Encounter (Signed)
Patient scheduled for Harrington Challenger new start on 01/04/2023. She states she is unlikely to be able to start between now and end of year due to her busy holiday schedule and travel  Chesley Mires, PharmD, MPH, BCPS, CPP Clinical Pharmacist (Rheumatology and Pulmonology)

## 2022-12-20 ENCOUNTER — Encounter: Payer: Self-pay | Admitting: Oncology

## 2022-12-20 ENCOUNTER — Inpatient Hospital Stay: Payer: Medicare PPO | Attending: Oncology | Admitting: Oncology

## 2022-12-20 ENCOUNTER — Telehealth: Payer: Self-pay | Admitting: Oncology

## 2022-12-20 ENCOUNTER — Inpatient Hospital Stay: Payer: Medicare PPO

## 2022-12-20 VITALS — BP 129/74 | HR 81 | Temp 98.4°F | Resp 14 | Ht 67.5 in | Wt 164.7 lb

## 2022-12-20 DIAGNOSIS — Z79899 Other long term (current) drug therapy: Secondary | ICD-10-CM

## 2022-12-20 DIAGNOSIS — Z803 Family history of malignant neoplasm of breast: Secondary | ICD-10-CM | POA: Diagnosis not present

## 2022-12-20 DIAGNOSIS — R5383 Other fatigue: Secondary | ICD-10-CM

## 2022-12-20 DIAGNOSIS — K7469 Other cirrhosis of liver: Secondary | ICD-10-CM

## 2022-12-20 DIAGNOSIS — M2550 Pain in unspecified joint: Secondary | ICD-10-CM

## 2022-12-20 DIAGNOSIS — R42 Dizziness and giddiness: Secondary | ICD-10-CM

## 2022-12-20 DIAGNOSIS — J301 Allergic rhinitis due to pollen: Secondary | ICD-10-CM | POA: Diagnosis not present

## 2022-12-20 DIAGNOSIS — J3089 Other allergic rhinitis: Secondary | ICD-10-CM | POA: Diagnosis not present

## 2022-12-20 DIAGNOSIS — D539 Nutritional anemia, unspecified: Secondary | ICD-10-CM | POA: Diagnosis not present

## 2022-12-20 DIAGNOSIS — Z8041 Family history of malignant neoplasm of ovary: Secondary | ICD-10-CM | POA: Insufficient documentation

## 2022-12-20 DIAGNOSIS — R7989 Other specified abnormal findings of blood chemistry: Secondary | ICD-10-CM

## 2022-12-20 DIAGNOSIS — J3081 Allergic rhinitis due to animal (cat) (dog) hair and dander: Secondary | ICD-10-CM | POA: Diagnosis not present

## 2022-12-20 DIAGNOSIS — I10 Essential (primary) hypertension: Secondary | ICD-10-CM | POA: Diagnosis not present

## 2022-12-20 LAB — CBC WITH DIFFERENTIAL (CANCER CENTER ONLY)
Abs Immature Granulocytes: 0.02 10*3/uL (ref 0.00–0.07)
Basophils Absolute: 0.1 10*3/uL (ref 0.0–0.1)
Basophils Relative: 1 %
Eosinophils Absolute: 0.2 10*3/uL (ref 0.0–0.5)
Eosinophils Relative: 3 %
HCT: 42.1 % (ref 36.0–46.0)
Hemoglobin: 14.4 g/dL (ref 12.0–15.0)
Immature Granulocytes: 0 %
Lymphocytes Relative: 29 %
Lymphs Abs: 1.6 10*3/uL (ref 0.7–4.0)
MCH: 32.4 pg (ref 26.0–34.0)
MCHC: 34.2 g/dL (ref 30.0–36.0)
MCV: 94.6 fL (ref 80.0–100.0)
Monocytes Absolute: 0.4 10*3/uL (ref 0.1–1.0)
Monocytes Relative: 7 %
Neutro Abs: 3.2 10*3/uL (ref 1.7–7.7)
Neutrophils Relative %: 60 %
Platelet Count: 198 10*3/uL (ref 150–400)
RBC: 4.45 MIL/uL (ref 3.87–5.11)
RDW: 12.1 % (ref 11.5–15.5)
WBC Count: 5.5 10*3/uL (ref 4.0–10.5)
nRBC: 0 % (ref 0.0–0.2)
nRBC: 0 /100{WBCs}

## 2022-12-20 LAB — IRON AND TIBC
Iron: 184 ug/dL — ABNORMAL HIGH (ref 28–170)
Saturation Ratios: 61 % — ABNORMAL HIGH (ref 10.4–31.8)
TIBC: 300 ug/dL (ref 250–450)
UIBC: 116 ug/dL

## 2022-12-20 LAB — FOLATE: Folate: 23.1 ng/mL (ref >5.9)

## 2022-12-20 LAB — VITAMIN B12: Vitamin B-12: 871 pg/mL (ref 180–914)

## 2022-12-20 LAB — CMP (CANCER CENTER ONLY)
ALT: 25 U/L (ref 0–44)
AST: 29 U/L (ref 15–41)
Albumin: 4.3 g/dL (ref 3.5–5.0)
Alkaline Phosphatase: 98 U/L (ref 38–126)
Anion gap: 11 (ref 5–15)
BUN: 19 mg/dL (ref 8–23)
CO2: 27 mmol/L (ref 22–32)
Calcium: 9.6 mg/dL (ref 8.9–10.3)
Chloride: 105 mmol/L (ref 98–111)
Creatinine: 0.71 mg/dL (ref 0.44–1.00)
GFR, Estimated: >60 mL/min (ref >60)
Glucose, Bld: 96 mg/dL (ref 70–99)
Potassium: 3.7 mmol/L (ref 3.5–5.1)
Sodium: 142 mmol/L (ref 135–145)
Total Bilirubin: 0.3 mg/dL (ref <1.2)
Total Protein: 7 g/dL (ref 6.5–8.1)

## 2022-12-20 LAB — SEDIMENTATION RATE: Sed Rate: 4 mm/h (ref 0–22)

## 2022-12-20 LAB — TRANSFERRIN: Transferrin: 202 mg/dL (ref 192–382)

## 2022-12-20 LAB — FERRITIN: Ferritin: 444 ng/mL — ABNORMAL HIGH (ref 11–307)

## 2022-12-20 LAB — LACTATE DEHYDROGENASE: LDH: 194 U/L — ABNORMAL HIGH (ref 98–192)

## 2022-12-20 LAB — C-REACTIVE PROTEIN: CRP: 0.6 mg/dL (ref <1.0)

## 2022-12-20 LAB — VITAMIN D 25 HYDROXY (VIT D DEFICIENCY, FRACTURES): Vit D, 25-Hydroxy: 53.83 ng/mL (ref 30–100)

## 2022-12-20 NOTE — Telephone Encounter (Signed)
12/20/22 Spoke with patient and confirmed with patient.

## 2022-12-20 NOTE — Progress Notes (Signed)
[facility]  [phone] [phone]  ID: Al Corpus OB: November 26, 1950  MR#: [id number]  DGU#:[id number]  Patient Care Team: Wilfrid Lund, PA as PCP - General (Family Medicine)  CHIEF COMPLAINT: Elevated ferritin and B12 levels.  INTERVAL HISTORY: Patient is a 72 female who is here for elevated ferritin and B12 levels.  She was referred by her primary care doctor.  Reports elevation in both of these labs over the past year to 18 months.  Mrs. Bucklin is a 72 year old female with past medical history significant for hypertension, hyperlipidemia, anxiety, hypothyroidism, osteopenia, postmenopausal status post hysterectomy, asthma,  allergic rhinitis, Lyme's disease, OA and knees, hip and back and frequent UTIs.  She has past surgical history significant for cholecystectomy in 1999 and total hysterectomy.  Overall she is in pretty good health.  She is retired Psychologist, forensic and principal.  Today, she denies any bleeding per rectum, melena or hematochezia.  Has had both colonoscopy and EGD with good results.  Reports concern of elevated ferritin and B12 levels for a while now.  Has distant relatives (cousins) with hereditary hemochromatosis so she has done some research and is interested in getting tested.  She also has history of nonalcoholic fatty liver disease and has concerns that her liver may be affected.  She has had abdominal imaging but not since 2017.  Reports increased brain fog over the past 12 to 18 months.  Initially thought it was due to medications but now she is not sure.  Reports nightly night sweats although she is not drenched the bed.  Denies a change in appetite and denies weight loss.  No new medications.  Reports she has restarted her PPI for a gastric ulcer that she feels like may be coming back.  Reports she has been keeping an eye on her hemoglobin over the past few months and noted an increase (15.8)during 1 lab draw.  Repeat lab draw  showed normal hemoglobin (15.1).  The rest of her lab work was normal.  Reports an elevated ferritin at 541 with a low transferrin at 191.  Vitamin B12 level elevated at 1112.  She is currently not on any over-the-counter supplements including B12 or iron.  Her diet is fairly normal.  No history of autoimmune. Does have joint pain.   Takes a vitamin D  Past Medical History:  Diagnosis Date   Abnormal Pap smear of cervix    Asthma    Hypertension    Osteopenia    Thyroid goiter     Previous work-up- Most recent colonoscopy and EGD.   Most recent labs.   Denies   REVIEW OF SYSTEMS:   Review of Systems  Constitutional:  Positive for malaise/fatigue.  Gastrointestinal:  Negative for abdominal pain, blood in stool, constipation, melena, nausea and vomiting.  Musculoskeletal:  Positive for joint pain.  Neurological:  Positive for dizziness. Negative for weakness.  Psychiatric/Behavioral:  Negative for depression, substance abuse and suicidal ideas. The patient is not nervous/anxious.     As per HPI. Otherwise, a complete review of systems is negative.  PAST MEDICAL HISTORY: Past Medical History:  Diagnosis Date   Abnormal Pap smear of cervix    Asthma    Hypertension    Osteopenia    Thyroid goiter     PAST SURGICAL HISTORY: Past Surgical History:  Procedure Laterality Date   BREAST BIOPSY Left    2018   CERVICAL CONE BIOPSY     GALLBLADDER SURGERY  HYSTEROTOMY  2020    FAMILY HISTORY: Family History  Problem Relation Age of Onset   Breast cancer Mother 23   Diabetes Father    Cancer Sister        OVARIAN   Heart disease Brother     ADVANCED DIRECTIVES (Y/N):  N  HEALTH MAINTENANCE: Social History   Tobacco Use   Smoking status: Never   Smokeless tobacco: Never  Vaping Use   Vaping status: Never Used  Substance Use Topics   Alcohol use: Not Currently   Drug use: Never     Allergies  Allergen Reactions   Diltiazem Hcl Other (See Comments)     Stoke like symptoms  Stoke like symptoms     Stoke like symptoms   Penicillins Rash   Doxycycline Rash   Hydrocodone Palpitations   Lisinopril Cough and Other (See Comments)    Other reaction(s): Cough     Current Outpatient Medications  Medication Sig Dispense Refill   albuterol (VENTOLIN HFA) 108 (90 Base) MCG/ACT inhaler Inhale 2 puffs into the lungs every 6 (six) hours as needed for wheezing or shortness of breath. 1 each 11   amLODipine (NORVASC) 5 MG tablet      budesonide-formoterol (SYMBICORT) 160-4.5 MCG/ACT inhaler Inhale 2 puffs into the lungs 2 (two) times daily. 10.2 g 6   chlorthalidone (HYGROTON) 25 MG tablet Take 12.5 mg by mouth every morning.     COMIRNATY SUSP injection Inject into the muscle.     escitalopram (LEXAPRO) 10 MG tablet      fluticasone (FLONASE) 50 MCG/ACT nasal spray Place 1 spray into both nostrils daily. 16 g 1   levothyroxine (SYNTHROID) 88 MCG tablet Take 75 mcg by mouth daily.     meloxicam (MOBIC) 7.5 MG tablet Take 1 tablet (7.5 mg total) by mouth daily. 10 tablet 0   montelukast (SINGULAIR) 10 MG tablet      pantoprazole (PROTONIX) 40 MG tablet Take 1 tablet (40 mg total) by mouth daily. 30 tablet 3   rosuvastatin (CRESTOR) 10 MG tablet      No current facility-administered medications for this visit.    OBJECTIVE: There were no vitals filed for this visit.   There is no height or weight on file to calculate BMI.    ECOG FS:0 - Asymptomatic  Physical Exam Constitutional:      Appearance: Normal appearance.  Cardiovascular:     Rate and Rhythm: Normal rate and regular rhythm.  Pulmonary:     Effort: Pulmonary effort is normal.     Breath sounds: Normal breath sounds.  Abdominal:     General: Bowel sounds are normal.     Palpations: Abdomen is soft.  Musculoskeletal:        General: No swelling. Normal range of motion.  Neurological:     Mental Status: She is alert and oriented to person, place, and time. Mental status is at  baseline.      LAB RESULTS:  No results found for: "NA", "K", "CL", "CO2", "GLUCOSE", "BUN", "CREATININE", "CALCIUM", "PROT", "ALBUMIN", "AST", "ALT", "ALKPHOS", "BILITOT", "GFRNONAA", "GFRAA"  Lab Results  Component Value Date   WBC 5.8 12/06/2022   NEUTROABS 3.6 12/06/2022   HGB 15.8 (H) 12/06/2022   HCT 46.7 (H) 12/06/2022   MCV 99.5 12/06/2022   PLT 221.0 12/06/2022     STUDIES: No results found.  ASSESSMENT: Patient is a 72 year old female who is here for elevated ferritin and B12 levels.   1. Nutritional anemia (Primary) -Patient  has been wanting a nutritional panel for a while given a few of her labs have been off.  Has history of elevated ferritin and B12 levels. -She also has some concern about brain fog and some joint pain she has been having.  -We discussed labs today and to return to clinic in approximately 2 weeks.  - CBC with Differential (Cancer Center Only); Future - Ferritin - Transferrin; Future - Iron and TIBC ([facility] ONLY); Future - Methylmalonic acid, serum; Future - Vitamin B12; Future - Vitamin D 25 hydroxy; Future - Copper, serum; Future - Folate; Future  2. Elevated ferritin -Has distant relatives with hereditary hemochromatosis and would like to be tested.  We discussed that typically within hemochromatosis ferritin levels are closer to 1000 but happy to order this for her. Ferritin concentrations between 300 and 1000 mcg/L are often related to obesity, nonalcoholic fatty liver disease, alcohol consumption, inflammation, and other etiologies not related to iron overload.  -Will also recheck ferritin, transferrin, iron and TIBC along with an ultrasound of her abdomen. 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  - CBC with Differential (Cancer Center Only); Future - Ferritin - Transferrin; Future - Iron and TIBC ([facility] ONLY); Future - HFE-Associated Hereditary Hemochromatosis (COHESION);  Future - Methylmalonic acid, serum; Future - US Abdomen Complete; Future - Hemochromatosis DNA-PCR(c282y,h63d)  3. Other cirrhosis of liver (HCC) -Recommend repeat lab work and ultrasound of abdomen.  Will review results at her next visit.  - CBC with Differential (Cancer Center Only); Future - Ferritin - Transferrin; Future - Iron and TIBC ([facility]); Future - Methylmalonic acid, serum; Future - US Abdomen Complete; Future - Vitamin B12; Future - Sedimentation rate; Future - Lactate dehydrogenase; Future - C-reactive protein; Future - ANA - Rheumatoid factor; Future - Vitamin D 25 hydroxy; Future - Copper, serum; Future - Folate; Future  4. Elevated vitamin B12 level -Unclear etiology but will add MMA.  - Methylmalonic acid, serum; Future - Vitamin B12; Future  5. Arthralgia, unspecified joint -Has significant worsening joint pain over the past 18 months.  She has been seen by rheumatology for Lyme's disease in the past who also diagnosed her with fibromyalgia. -Labs today and we will review these in a few weeks.  - Vitamin B12; Future - Sedimentation rate; Future - Lactate dehydrogenase; Future - C-reactive protein; Future - ANA - Rheumatoid factor; Future   Disposition- Return to clinic in approximately 2 weeks for follow-up.   Patient expressed understanding and was in agreement with this plan. She also understands that She can call clinic at any time with any questions, concerns, or complaints.   Cancer Staging  No matching staging information was found for the patient.  I spent 60 minutes dedicated to the care of this patient (face-to-face and non-face-to-face) on the date of the encounter to include what is described in the assessment and plan.  Mauro Kaufmann, NP   12/20/2022 12:58 PM

## 2022-12-21 ENCOUNTER — Encounter: Payer: Self-pay | Admitting: Oncology

## 2022-12-21 LAB — RHEUMATOID FACTOR: Rheumatoid fact SerPl-aCnc: <10 [IU]/mL (ref <14.0)

## 2022-12-21 LAB — COPPER, SERUM: Copper: 104 ug/dL (ref 80–158)

## 2022-12-22 DIAGNOSIS — R7989 Other specified abnormal findings of blood chemistry: Secondary | ICD-10-CM | POA: Diagnosis not present

## 2022-12-22 LAB — ANA: Anti Nuclear Antibody (ANA): NEGATIVE

## 2022-12-22 LAB — METHYLMALONIC ACID, SERUM: Methylmalonic Acid, Quantitative: 209 nmol/L (ref 0–378)

## 2022-12-25 LAB — HEMOCHROMATOSIS DNA-PCR(C282Y,H63D)

## 2022-12-25 NOTE — Progress Notes (Signed)
Hey Dr. Melvyn Neth,  I believe you will see this patient on 01/07/2022 for follow-up.  Durenda Hurt, NP 12/25/2022 3:53 PM

## 2022-12-28 ENCOUNTER — Encounter: Payer: Self-pay | Admitting: Oncology

## 2023-01-01 NOTE — Telephone Encounter (Signed)
Hey Dr. 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  She is wanting to have bunion surgery on January 13 and apparently preadmission is calling her the morning that you see her.  She is wondering if it is still okay for her to have this surgery given new diagnosis or if she needs to see you first and move this back?  Durenda Hurt, NP 01/01/2023 1:25 PM

## 2023-01-04 ENCOUNTER — Other Ambulatory Visit: Payer: Medicare PPO | Admitting: Pharmacist

## 2023-01-04 DIAGNOSIS — J3089 Other allergic rhinitis: Secondary | ICD-10-CM | POA: Diagnosis not present

## 2023-01-04 DIAGNOSIS — J301 Allergic rhinitis due to pollen: Secondary | ICD-10-CM | POA: Diagnosis not present

## 2023-01-07 ENCOUNTER — Encounter: Payer: Self-pay | Admitting: Urgent Care

## 2023-01-07 NOTE — Progress Notes (Signed)
 [facility]  [address] [place],  [place]  [address] [phone]  Clinic Day:  01/08/2023  Referring physician: Alben Therisa MATSU, PA   HISTORY OF PRESENT ILLNESS:  The patient is a 73 y.o. female who our office recently saw for an elevated ferritin.  Her B12 levels were also recently elevated.  She comes in today to go over her recent labs to determine the etiology behind this.  Overall, the patient is doing okay.  She denies having any particular changes in her health since her last visit.     PHYSICAL EXAM:  Blood pressure 124/76, pulse 82, temperature 98.2 F (36.8 C), temperature source Oral, resp. rate 16, height 5' 7.5 (1.715 m), weight 162 lb 14.4 oz (73.9 kg), SpO2 100%. Wt Readings from Last 3 Encounters:  01/08/23 162 lb 14.4 oz (73.9 kg)  01/08/23 160 lb (72.6 kg)  12/20/22 164 lb 11.2 oz (74.7 kg)   Body mass index is 25.14 kg/m. Performance status (ECOG): 0 - Asymptomatic Physical Exam Constitutional:      Appearance: Normal appearance. She is not ill-appearing.  HENT:     Mouth/Throat:     Mouth: Mucous membranes are moist.     Pharynx: Oropharynx is clear. No oropharyngeal exudate or posterior oropharyngeal erythema.  Cardiovascular:     Rate and Rhythm: Normal rate and regular rhythm.     Heart sounds: No murmur heard.    No friction rub. No gallop.  Pulmonary:     Effort: Pulmonary effort is normal. No respiratory distress.     Breath sounds: Normal breath sounds. No wheezing, rhonchi or rales.  Abdominal:     General: Bowel sounds are normal. There is no distension.     Palpations: Abdomen is soft. There is no mass.     Tenderness: There is no abdominal tenderness.  Musculoskeletal:        General: No swelling.     Right lower leg: No edema.     Left lower leg: No edema.  Lymphadenopathy:     Cervical: No cervical adenopathy.     Upper Body:     Right upper body: No supraclavicular or axillary adenopathy.     Left  upper body: No supraclavicular or axillary adenopathy.     Lower Body: No right inguinal adenopathy. No left inguinal adenopathy.  Skin:    General: Skin is warm.     Coloration: Skin is not jaundiced.     Findings: No lesion or rash.  Neurological:     General: No focal deficit present.     Mental Status: She is alert and oriented to person, place, and time. Mental status is at baseline.  Psychiatric:        Mood and Affect: Mood normal.        Behavior: Behavior normal.        Thought Content: Thought content normal.     LABS:      Latest Ref Rng & Units 12/20/2022    2:29 PM 12/06/2022   11:44 AM 12/19/2021   11:42 AM  CBC  WBC 4.0 - 10.5 K/uL 5.5  5.8  5.7   Hemoglobin 12.0 - 15.0 g/dL 85.5  84.1  85.7   Hematocrit 36.0 - 46.0 % 42.1  46.7  42.2   Platelets 150 - 400 K/uL 198  221.0  227       Latest Ref Rng & Units 12/20/2022    2:29 PM  CMP  Glucose  70 - 99 mg/dL 96   BUN 8 - 23 mg/dL 19   Creatinine 9.55 - 1.00 mg/dL 9.28   Sodium 864 - 854 mmol/L 142   Potassium 3.5 - 5.1 mmol/L 3.7   Chloride 98 - 111 mmol/L 105   CO2 22 - 32 mmol/L 27   Calcium 8.9 - 10.3 mg/dL 9.6   Total Protein 6.5 - 8.1 g/dL 7.0   Total Bilirubin <8.7 mg/dL 0.3   Alkaline Phos 38 - 126 U/L 98   AST 15 - 41 U/L 29   ALT 0 - 44 U/L 25    HEMOCHROMATOSIS TESTING: c.845G>A (p.Cys282Tyr) - Detected, homozygous  c.187C>G (p.His63Asp) - Not Detected  c.193A>T (p.Ser65Cys) - Not Detected  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    Latest Reference Range & Units 12/20/22 14:29 12/20/22 14:30  Iron 28 - 170 ug/dL  815 (H)  UIBC ug/dL  883  TIBC 749 - 549 ug/dL  699  Saturation Ratios 10.4 - 31.8 %  61 (H)  Ferritin 11 - 307 ng/mL 444 (H)   (H): Data is abnormally high   ASSESSMENT & PLAN:  Assessment/Plan:  A 73 y.o. female whose labs show she has hemochromatosis (C282Y/C282Y).  This is definitely the reason behind her elevated iron parameters.  Based upon  this, I will arrange for her to be phlebotomized weekly until her ferritin falls below 50.  I also told the patient to tell her 1st degree relatives to all have their iron parameters checked.  If one of them has iron parameters that come back abnormally high, they need to be screened for hemochromatosis.  She also understands that all of her children will at least be carriers for hemochromatosis.  She understands her children's father would also have to have an abnormal hemochromatosis mutation and have passed it on to them in order for them to be completely affected with hemochromatosis.  I will see this patient back in 4 weeks to reassess her iron parameters to see how effective her weekly phlebotomies have been in bringing them down, as it pertains to her hemochromatosis.  The patient understands all the plans discussed today and is in agreement with them.    Raveena Hebdon DELENA Kerns, MD

## 2023-01-08 ENCOUNTER — Encounter
Admission: RE | Admit: 2023-01-08 | Discharge: 2023-01-08 | Disposition: A | Discharge status: Home / Self Care | Payer: Medicare PPO | Source: Ambulatory Visit | Attending: Podiatry | Admitting: Podiatry

## 2023-01-08 ENCOUNTER — Other Ambulatory Visit: Payer: Self-pay

## 2023-01-08 ENCOUNTER — Encounter: Payer: Medicare PPO | Admitting: Orthopedic Surgery

## 2023-01-08 ENCOUNTER — Inpatient Hospital Stay: Payer: Medicare PPO | Attending: Oncology | Admitting: Oncology

## 2023-01-08 ENCOUNTER — Encounter: Payer: Self-pay | Admitting: Oncology

## 2023-01-08 ENCOUNTER — Telehealth: Payer: Self-pay | Admitting: Oncology

## 2023-01-08 VITALS — Ht 67.0 in | Wt 160.0 lb

## 2023-01-08 DIAGNOSIS — I10 Essential (primary) hypertension: Secondary | ICD-10-CM

## 2023-01-08 HISTORY — DX: Hypothyroidism, unspecified: E03.9

## 2023-01-08 HISTORY — DX: Gastro-esophageal reflux disease without esophagitis: K21.9

## 2023-01-08 HISTORY — DX: Hemochromatosis, unspecified: E83.119

## 2023-01-08 NOTE — Pre-Procedure Instructions (Signed)
 Patient had stated during her interview she would unlikely be able to come to office for EKG as she lives in [place], [place].

## 2023-01-08 NOTE — Telephone Encounter (Signed)
 Patient has been scheduled for follow-up visit per 01/08/23 LOS.  Pt given an appt calendar with date and time.

## 2023-01-08 NOTE — Patient Instructions (Addendum)
 Your procedure is scheduled on: Monday 01/15/23 To find out your arrival time, please call [phone] between 1PM - 3PM on:   Friday 01/12/23 Report to the Registration Desk on the 1st floor of the [facility]. FREE Valet parking is available.  If your arrival time is 6:00 am, do not arrive before that time as the [facility] entrance doors do not open until 6:00 am.  REMEMBER: Instructions that are not followed completely may result in serious medical risk, up to and including death; or upon the discretion of your surgeon and anesthesiologist your surgery may need to be rescheduled.  Do not eat food after midnight the night before surgery.  No gum chewing or hard candies.  You may however, drink CLEAR liquids up to 2 hours before you are scheduled to arrive for your surgery. Do not drink anything within 2 hours of your scheduled arrival time.  Clear liquids include: - water  - apple juice without pulp - gatorade (not RED colors) - black coffee or tea (Do NOT add milk or creamers to the coffee or tea) Do NOT drink anything that is not on this list.  Type 1 and Type 2 diabetics should only drink water.  In addition, your doctor has ordered for you to drink the provided:  Ensure Pre-Surgery Clear Carbohydrate Drink  Gatorade G2 Drinking this carbohydrate drink up to two hours before surgery helps to reduce insulin resistance and improve patient outcomes. Please complete drinking 2 hours before scheduled arrival time.  One week prior to surgery: Stop Anti-inflammatories (NSAIDS) such as Advil, Aleve, Ibuprofen, Motrin, Naproxen, Naprosyn and Aspirin based products such as Excedrin, Goody's Powder, BC Powder. Hold Meloxicam  for 7 days before surgery. You may however, continue to take Tylenol if needed for pain up until the day of surgery.  Stop ANY OVER THE COUNTER supplements and vitamins for 7 days until after surgery.  Continue taking all prescribed medications.   TAKE ONLY  THESE MEDICATIONS THE MORNING OF SURGERY WITH A SIP OF WATER:  amLODipine (NORVASC) 5 MG tablet  levothyroxine (SYNTHROID) 75 MCG tablet  pantoprazole  (PROTONIX ) 40 MG tablet Antacid (take one the night before and one on the morning of surgery - helps to prevent nausea after surgery.)  Use inhalers on the day of surgery and bring to the hospital.  No Alcohol for 24 hours before or after surgery.  No Smoking including e-cigarettes for 24 hours before surgery.  No chewable tobacco products for at least 6 hours before surgery.  No nicotine patches on the day of surgery.  Do not use any recreational drugs for at least a week (preferably 2 weeks) before your surgery.  Please be advised that the combination of cocaine and anesthesia may have negative outcomes, up to and including death. If you test positive for cocaine, your surgery will be cancelled.  On the morning of surgery brush your teeth with toothpaste and water, you may rinse your mouth with mouthwash if you wish. Do not swallow any toothpaste or mouthwash.  Use CHG Soap or wipes as directed on instruction sheet. (Chlorhexidine or Hibiclens can be purchased at your local drugstore)  Do not wear lotions, powders, or perfumes.   Do not shave body hair from the neck down 48 hours before surgery.  Wear clean comfortable clothing (specific to your surgery type) to the hospital.  Do not wear jewelry, make-up, hairpins, clips or nail polish. Must remove toenail polish.  For welded (permanent) jewelry: bracelets, anklets, waist bands,  etc.  Please have this removed prior to surgery.  If it is not removed, there is a chance that hospital personnel will need to cut it off on the day of surgery. Contact lenses, hearing aids and dentures may not be worn into surgery.  Do not bring valuables to the hospital. [facility] is not responsible for any missing/lost belongings or valuables.   Notify your doctor if there is any change in your  medical condition (cold, fever, infection).  If you are being discharged the day of surgery, you will not be allowed to drive home. You will need a responsible individual to drive you home and stay with you for 24 hours after surgery.   If you are taking public transportation, you will need to have a responsible individual with you.  If you are being admitted to the hospital overnight, leave your suitcase in the car. After surgery it may be brought to your room.  In case of increased patient census, it may be necessary for you, the patient, to continue your postoperative care in the Same Day Surgery department.  After surgery, you can help prevent lung complications by doing breathing exercises.  Take deep breaths and cough every 1-2 hours. Your doctor may order a device called an Incentive Spirometer to help you take deep breaths. When coughing or sneezing, hold a pillow firmly against your incision with both hands. This is called "splinting." Doing this helps protect your incision. It also decreases belly discomfort.  Surgery Visitation Policy:  Patients undergoing a surgery or procedure may have two family members or support persons with them as long as the person is not COVID-19 positive or experiencing its symptoms.   Inpatient Visitation:    Visiting hours are 7 a.m. to 8 p.m. Up to four visitors are allowed at one time in a patient room. The visitors may rotate out with other people during the day. One designated support person (adult) may remain overnight.  Please call the Pre-admissions Testing Dept. at [phone] if you have any questions about these instructions.     Preparing for Surgery with CHLORHEXIDINE GLUCONATE (CHG) Soap  Chlorhexidine Gluconate (CHG) Soap  o An antiseptic cleaner that kills germs and bonds with the skin to continue killing germs even after washing  o Used for showering the night before surgery and morning of surgery  Before surgery, you can  play an important role by reducing the number of germs on your skin.  CHG (Chlorhexidine gluconate) soap is an antiseptic cleanser which kills germs and bonds with the skin to continue killing germs even after washing.  Please do not use if you have an allergy to CHG or antibacterial soaps. If your skin becomes reddened/irritated stop using the CHG.  1. Shower the NIGHT BEFORE SURGERY and the MORNING OF SURGERY with CHG soap.  2. If you choose to wash your hair, wash your hair first as usual with your normal shampoo.  3. After shampooing, rinse your hair and body thoroughly to remove the shampoo.  4. Use CHG as you would any other liquid soap. You can apply CHG directly to the skin and wash gently with a scrungie or a clean washcloth.  5. Apply the CHG soap to your body only from the neck down. Do not use on open wounds or open sores. Avoid contact with your eyes, ears, mouth, and genitals (private parts). Wash face and genitals (private parts) with your normal soap.  6. Wash thoroughly, paying special attention to  the area where your surgery will be performed.  7. Thoroughly rinse your body with warm water.  8. Do not shower/wash with your normal soap after using and rinsing off the CHG soap.  9. Pat yourself dry with a clean towel.  10. Wear clean pajamas to bed the night before surgery.  12. Place clean sheets on your bed the night of your first shower and do not sleep with pets.  13. Shower again with the CHG soap on the day of surgery prior to arriving at the hospital.  14. Do not apply any deodorants/lotions/powders.  15. Please wear clean clothes to the hospital.

## 2023-01-09 ENCOUNTER — Inpatient Hospital Stay: Payer: Medicare PPO

## 2023-01-09 NOTE — Patient Instructions (Signed)

## 2023-01-09 NOTE — Progress Notes (Signed)
 Yolanda Vargas presents today for phlebotomy per MD orders. Phlebotomy procedure started at 1550 and ended at 1410. 490 grams removed. Patient observed for 30 minutes after procedure without any incident. Patient tolerated procedure well. IV needle removed intact. RIGHT AC (18 ga needle)

## 2023-01-10 ENCOUNTER — Encounter: Payer: Medicare PPO | Admitting: Orthopedic Surgery

## 2023-01-12 ENCOUNTER — Encounter: Payer: Self-pay | Admitting: Oncology

## 2023-01-16 ENCOUNTER — Inpatient Hospital Stay: Payer: Medicare PPO

## 2023-01-16 NOTE — Patient Instructions (Signed)

## 2023-01-16 NOTE — Progress Notes (Signed)
 Yolanda Vargas presents today for phlebotomy per MD orders. Phlebotomy procedure started at 1115 and ended at 1130. 485 grams removed. Patient observed for 10 minutes after procedure without any incident. Patient tolerated procedure well. IV needle removed intact. RIGHT AC

## 2023-01-18 NOTE — Telephone Encounter (Signed)
ATC patient regarding Har[facility] Challenger new start. Unable to reach. Left VM for patient requesting return call  Chesley Mires, PharmD, MPH, BCPS, CPP Clinical Pharmacist (Rheumatology and Pulmonology)

## 2023-01-19 DIAGNOSIS — J3081 Allergic rhinitis due to animal (cat) (dog) hair and dander: Secondary | ICD-10-CM | POA: Diagnosis not present

## 2023-01-19 DIAGNOSIS — J3089 Other allergic rhinitis: Secondary | ICD-10-CM | POA: Diagnosis not present

## 2023-01-19 DIAGNOSIS — J301 Allergic rhinitis due to pollen: Secondary | ICD-10-CM | POA: Diagnosis not present

## 2023-01-22 NOTE — Telephone Encounter (Signed)
MyChart message sent to patient for Yolanda Vargas new start

## 2023-01-23 ENCOUNTER — Inpatient Hospital Stay: Payer: Medicare PPO

## 2023-01-23 NOTE — Patient Instructions (Signed)

## 2023-01-23 NOTE — Progress Notes (Signed)
Yolanda Vargas presents today for phlebotomy per MD orders. Phlebotomy procedure started at 1110 and ended at 1140. 430 grams removed. Patient observed for 30 minutes after procedure without any incident. Patient tolerated procedure well. IV needle removed intact. RIGHT AC FAILED ATTEMPT LEFT AC

## 2023-01-24 ENCOUNTER — Encounter: Payer: Medicare PPO | Admitting: Podiatry

## 2023-01-30 ENCOUNTER — Inpatient Hospital Stay: Payer: Medicare PPO

## 2023-01-30 NOTE — Progress Notes (Signed)
Yolanda Vargas presents today for phlebotomy per MD orders. Phlebotomy procedure started at 1507 and ended at 1530. 495 grams removed. Patient declined to observed for 30 minutes after procedure, d/c stable Patient tolerated procedure well. 18g IV needle removed intact.

## 2023-01-30 NOTE — Patient Instructions (Signed)

## 2023-02-02 DIAGNOSIS — J301 Allergic rhinitis due to pollen: Secondary | ICD-10-CM | POA: Diagnosis not present

## 2023-02-02 DIAGNOSIS — J3089 Other allergic rhinitis: Secondary | ICD-10-CM | POA: Diagnosis not present

## 2023-02-05 ENCOUNTER — Other Ambulatory Visit: Payer: Medicare PPO

## 2023-02-05 ENCOUNTER — Inpatient Hospital Stay: Payer: Medicare PPO | Attending: Oncology

## 2023-02-05 ENCOUNTER — Other Ambulatory Visit: Payer: Self-pay | Admitting: Oncology

## 2023-02-05 LAB — CBC WITH DIFFERENTIAL (CANCER CENTER ONLY)
Abs Immature Granulocytes: 0.01 10*3/uL (ref 0.00–0.07)
Basophils Absolute: 0.1 10*3/uL (ref 0.0–0.1)
Basophils Relative: 1 %
Eosinophils Absolute: 0.2 10*3/uL (ref 0.0–0.5)
Eosinophils Relative: 4 %
HCT: 38.1 % (ref 36.0–46.0)
Hemoglobin: 13 g/dL (ref 12.0–15.0)
Immature Granulocytes: 0 %
Lymphocytes Relative: 29 %
Lymphs Abs: 1.3 10*3/uL (ref 0.7–4.0)
MCH: 33.7 pg (ref 26.0–34.0)
MCHC: 34.1 g/dL (ref 30.0–36.0)
MCV: 98.7 fL (ref 80.0–100.0)
Monocytes Absolute: 0.3 10*3/uL (ref 0.1–1.0)
Monocytes Relative: 7 %
Neutro Abs: 2.7 10*3/uL (ref 1.7–7.7)
Neutrophils Relative %: 59 %
Platelet Count: 208 10*3/uL (ref 150–400)
RBC: 3.86 MIL/uL — ABNORMAL LOW (ref 3.87–5.11)
RDW: 13.5 % (ref 11.5–15.5)
WBC Count: 4.5 10*3/uL (ref 4.0–10.5)
nRBC: 0 % (ref 0.0–0.2)
nRBC: 0 /100{WBCs}

## 2023-02-05 LAB — CMP (CANCER CENTER ONLY)
ALT: 20 U/L (ref 0–44)
AST: 28 U/L (ref 15–41)
Albumin: 4.4 g/dL (ref 3.5–5.0)
Alkaline Phosphatase: 95 U/L (ref 38–126)
Anion gap: 9 (ref 5–15)
BUN: 22 mg/dL (ref 8–23)
CO2: 28 mmol/L (ref 22–32)
Calcium: 9.9 mg/dL (ref 8.9–10.3)
Chloride: 106 mmol/L (ref 98–111)
Creatinine: 0.75 mg/dL (ref 0.44–1.00)
GFR, Estimated: >60 mL/min (ref >60)
Glucose, Bld: 104 mg/dL — ABNORMAL HIGH (ref 70–99)
Potassium: 4.4 mmol/L (ref 3.5–5.1)
Sodium: 143 mmol/L (ref 135–145)
Total Bilirubin: 0.3 mg/dL (ref 0.0–1.2)
Total Protein: 7 g/dL (ref 6.5–8.1)

## 2023-02-05 LAB — IRON AND TIBC
Iron: 163 ug/dL (ref 28–170)
Saturation Ratios: 53 % — ABNORMAL HIGH (ref 10.4–31.8)
TIBC: 305 ug/dL (ref 250–450)
UIBC: 142 ug/dL

## 2023-02-05 LAB — FERRITIN: Ferritin: 316 ng/mL — ABNORMAL HIGH (ref 11–307)

## 2023-02-06 DIAGNOSIS — L578 Other skin changes due to chronic exposure to nonionizing radiation: Secondary | ICD-10-CM | POA: Diagnosis not present

## 2023-02-06 DIAGNOSIS — L853 Xerosis cutis: Secondary | ICD-10-CM | POA: Diagnosis not present

## 2023-02-06 DIAGNOSIS — Z85828 Personal history of other malignant neoplasm of skin: Secondary | ICD-10-CM | POA: Diagnosis not present

## 2023-02-06 DIAGNOSIS — L814 Other melanin hyperpigmentation: Secondary | ICD-10-CM | POA: Diagnosis not present

## 2023-02-06 DIAGNOSIS — L821 Other seborrheic keratosis: Secondary | ICD-10-CM | POA: Diagnosis not present

## 2023-02-06 DIAGNOSIS — D225 Melanocytic nevi of trunk: Secondary | ICD-10-CM | POA: Diagnosis not present

## 2023-02-06 DIAGNOSIS — Z08 Encounter for follow-up examination after completed treatment for malignant neoplasm: Secondary | ICD-10-CM | POA: Diagnosis not present

## 2023-02-06 DIAGNOSIS — L570 Actinic keratosis: Secondary | ICD-10-CM | POA: Diagnosis not present

## 2023-02-06 NOTE — Progress Notes (Signed)
 [facility]  [address] [place],  [place]  [address] [phone]  Clinic Day:  02/07/2023  Referring physician: Alben Therisa MATSU, PA   HISTORY OF PRESENT ILLNESS:  The patient is a 73 y.o. 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  She comes in today to reassess her iron parameters after receiving 4 weekly phlebotomies.  Overall, she claims to be doing okay.  She does complain of having more fatigue since her phlebotomies started.    PHYSICAL EXAM:  Blood pressure 135/77, pulse 81, temperature 97.8 F (36.6 C), temperature source Oral, resp. rate 16, height 5' 7.5 (1.715 m), weight 164 lb 8 oz (74.6 kg), SpO2 99%. Wt Readings from Last 3 Encounters:  02/07/23 164 lb 8 oz (74.6 kg)  01/08/23 160 lb (72.6 kg)  01/08/23 162 lb 14.4 oz (73.9 kg)   Body mass index is 25.38 kg/m. Performance status (ECOG): 0 - Asymptomatic Physical Exam Constitutional:      Appearance: Normal appearance. She is not ill-appearing.  HENT:     Mouth/Throat:     Mouth: Mucous membranes are moist.     Pharynx: Oropharynx is clear. No oropharyngeal exudate or posterior oropharyngeal erythema.  Cardiovascular:     Rate and Rhythm: Normal rate and regular rhythm.     Heart sounds: No murmur heard.    No friction rub. No gallop.  Pulmonary:     Effort: Pulmonary effort is normal. No respiratory distress.     Breath sounds: Normal breath sounds. No wheezing, rhonchi or rales.  Abdominal:     General: Bowel sounds are normal. There is no distension.     Palpations: Abdomen is soft. There is no mass.     Tenderness: There is no abdominal tenderness.  Musculoskeletal:        General: No swelling.     Right lower leg: No edema.     Left lower leg: No edema.  Lymphadenopathy:     Cervical: No cervical adenopathy.     Upper Body:     Right upper body: No supraclavicular or axillary adenopathy.     Left upper body: No supraclavicular or axillary adenopathy.     Lower  Body: No right inguinal adenopathy. No left inguinal adenopathy.  Skin:    General: Skin is warm.     Coloration: Skin is not jaundiced.     Findings: No lesion or rash.  Neurological:     General: No focal deficit present.     Mental Status: She is alert and oriented to person, place, and time. Mental status is at baseline.  Psychiatric:        Mood and Affect: Mood normal.        Behavior: Behavior normal.        Thought Content: Thought content normal.     LABS:      Latest Ref Rng & Units 02/05/2023    9:46 AM 12/20/2022    2:29 PM 12/06/2022   11:44 AM  CBC  WBC 4.0 - 10.5 K/uL 4.5  5.5  5.8   Hemoglobin 12.0 - 15.0 g/dL 86.9  85.5  84.1   Hematocrit 36.0 - 46.0 % 38.1  42.1  46.7   Platelets 150 - 400 K/uL 208  198  221.0       Latest Ref Rng & Units 02/05/2023    9:46 AM 12/20/2022    2:29 PM  CMP  Glucose 70 - 99 mg/dL 895  96   BUN 8 -  23 mg/dL 22  19   Creatinine 9.55 - 1.00 mg/dL 9.24  9.28   Sodium 864 - 145 mmol/L 143  142   Potassium 3.5 - 5.1 mmol/L 4.4  3.7   Chloride 98 - 111 mmol/L 106  105   CO2 22 - 32 mmol/L 28  27   Calcium 8.9 - 10.3 mg/dL 9.9  9.6   Total Protein 6.5 - 8.1 g/dL 7.0  7.0   Total Bilirubin 0.0 - 1.2 mg/dL 0.3  0.3   Alkaline Phos 38 - 126 U/L 95  98   AST 15 - 41 U/L 28  29   ALT 0 - 44 U/L 20  25    HEMOCHROMATOSIS TESTING: c.845G>A (p.Cys282Tyr) - Detected, homozygous  c.187C>G (p.His63Asp) - Not Detected  c.193A>T (p.Ser65Cys) - Not Detected  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    Latest Reference Range & Units 12/20/22 14:29 12/20/22 14:30 02/05/23 09:46  Iron 28 - 170 ug/dL  815 (H) 836  UIBC ug/dL  883 857  TIBC 749 - 549 ug/dL  699 694  Saturation Ratios 10.4 - 31.8 %  61 (H) 53 (H)  Ferritin 11 - 307 ng/mL 444 (H)  316 (H)  (H): Data is abnormally high   ASSESSMENT & PLAN:  Assessment/Plan:  A 73 y.o. 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  Although her iron parameters are better,  the goal remains to get her ferritin falls below 50.  He will continue to be phlebotomized, but they will be spaced out to once every 2 weeks.  To minimize any weakness from her phlebotomies, 500 cc of fluid will be given after each phlebotomy session to ensure that she remains euvolemic.  Overall, the patient appears to be doing fairly well.  I will see her back in 8 weeks to reassess her iron parameters to see how well her upcoming phlebotomies have helped in bringing down her iron levels.  The patient understands all the plans discussed today and is in agreement with them.    Stefan Karen DELENA Kerns, MD

## 2023-02-07 ENCOUNTER — Inpatient Hospital Stay: Payer: Medicare PPO

## 2023-02-07 ENCOUNTER — Ambulatory Visit: Payer: Medicare PPO | Admitting: Pulmonary Disease

## 2023-02-07 ENCOUNTER — Encounter: Payer: Medicare PPO | Admitting: Podiatry

## 2023-02-07 ENCOUNTER — Inpatient Hospital Stay: Payer: Medicare PPO | Admitting: Oncology

## 2023-02-07 ENCOUNTER — Telehealth: Payer: Self-pay | Admitting: Urology

## 2023-02-07 MED ORDER — SODIUM CHLORIDE 0.9 % IV SOLN: Freq: Once | INTRAVENOUS | Status: DC

## 2023-02-07 NOTE — Patient Instructions (Signed)

## 2023-02-07 NOTE — Telephone Encounter (Signed)
 Pt called stating that she needs to cxl her sx on 03/12/23 with Dr. Lydia Sams, Due to health Reasons. I have informed hospital scheduling and Dr. Lydia Sams of this change. Told pt that when she is ready to reschedule to just give us  a call back.

## 2023-02-07 NOTE — Progress Notes (Signed)
 Yolanda Vargas presents today for phlebotomy per MD orders. Phlebotomy procedure started at 1545 and ended at 1600. 480 grams removed. Patient observed for 10 minutes after procedure without any incident. Patient tolerated procedure well. IV needle removed intact. RIGHT AC

## 2023-02-09 DIAGNOSIS — J3081 Allergic rhinitis due to animal (cat) (dog) hair and dander: Secondary | ICD-10-CM | POA: Diagnosis not present

## 2023-02-09 DIAGNOSIS — J301 Allergic rhinitis due to pollen: Secondary | ICD-10-CM | POA: Diagnosis not present

## 2023-02-09 DIAGNOSIS — J3089 Other allergic rhinitis: Secondary | ICD-10-CM | POA: Diagnosis not present

## 2023-02-21 ENCOUNTER — Inpatient Hospital Stay: Payer: Medicare PPO

## 2023-02-21 ENCOUNTER — Other Ambulatory Visit: Payer: Medicare PPO

## 2023-02-21 ENCOUNTER — Other Ambulatory Visit (HOSPITAL_BASED_OUTPATIENT_CLINIC_OR_DEPARTMENT_OTHER): Payer: Self-pay | Admitting: Family Medicine

## 2023-02-21 DIAGNOSIS — E538 Deficiency of other specified B group vitamins: Secondary | ICD-10-CM | POA: Diagnosis not present

## 2023-02-21 DIAGNOSIS — E559 Vitamin D deficiency, unspecified: Secondary | ICD-10-CM | POA: Diagnosis not present

## 2023-02-21 DIAGNOSIS — R7989 Other specified abnormal findings of blood chemistry: Secondary | ICD-10-CM | POA: Diagnosis not present

## 2023-02-21 DIAGNOSIS — E039 Hypothyroidism, unspecified: Secondary | ICD-10-CM | POA: Diagnosis not present

## 2023-02-21 DIAGNOSIS — F325 Major depressive disorder, single episode, in full remission: Secondary | ICD-10-CM | POA: Diagnosis not present

## 2023-02-21 DIAGNOSIS — Z8249 Family history of ischemic heart disease and other diseases of the circulatory system: Secondary | ICD-10-CM

## 2023-02-21 DIAGNOSIS — K219 Gastro-esophageal reflux disease without esophagitis: Secondary | ICD-10-CM | POA: Diagnosis not present

## 2023-02-21 DIAGNOSIS — Z1231 Encounter for screening mammogram for malignant neoplasm of breast: Secondary | ICD-10-CM

## 2023-02-21 DIAGNOSIS — E7800 Pure hypercholesterolemia, unspecified: Secondary | ICD-10-CM | POA: Diagnosis not present

## 2023-02-21 DIAGNOSIS — I10 Essential (primary) hypertension: Secondary | ICD-10-CM | POA: Diagnosis not present

## 2023-02-21 DIAGNOSIS — J4540 Moderate persistent asthma, uncomplicated: Secondary | ICD-10-CM | POA: Diagnosis not present

## 2023-02-21 DIAGNOSIS — Z13820 Encounter for screening for osteoporosis: Secondary | ICD-10-CM

## 2023-02-21 DIAGNOSIS — R7303 Prediabetes: Secondary | ICD-10-CM | POA: Diagnosis not present

## 2023-02-23 ENCOUNTER — Inpatient Hospital Stay: Payer: Medicare PPO

## 2023-02-23 ENCOUNTER — Ambulatory Visit (HOSPITAL_BASED_OUTPATIENT_CLINIC_OR_DEPARTMENT_OTHER)
Admission: RE | Admit: 2023-02-23 | Discharge: 2023-02-23 | Disposition: A | Discharge status: Home / Self Care | Payer: Self-pay | Source: Ambulatory Visit | Attending: Family Medicine | Admitting: Family Medicine

## 2023-02-23 DIAGNOSIS — Z8249 Family history of ischemic heart disease and other diseases of the circulatory system: Secondary | ICD-10-CM

## 2023-02-23 MED ORDER — SODIUM CHLORIDE 0.9 % IV SOLN: Freq: Once | INTRAVENOUS | Status: AC

## 2023-02-26 DIAGNOSIS — Z8719 Personal history of other diseases of the digestive system: Secondary | ICD-10-CM | POA: Diagnosis not present

## 2023-02-26 DIAGNOSIS — K5900 Constipation, unspecified: Secondary | ICD-10-CM | POA: Diagnosis not present

## 2023-02-26 DIAGNOSIS — R1013 Epigastric pain: Secondary | ICD-10-CM | POA: Diagnosis not present

## 2023-02-26 DIAGNOSIS — Z1509 Genetic susceptibility to other malignant neoplasm: Secondary | ICD-10-CM | POA: Diagnosis not present

## 2023-03-07 ENCOUNTER — Inpatient Hospital Stay: Payer: Medicare PPO | Attending: Oncology

## 2023-03-07 MED ORDER — SODIUM CHLORIDE 0.9 % IV SOLN: Freq: Once | INTRAVENOUS | Status: AC

## 2023-03-07 NOTE — Patient Instructions (Signed)

## 2023-03-07 NOTE — Progress Notes (Signed)
 Yolanda Vargas presents today for phlebotomy per MD orders. Phlebotomy procedure started at 1415 and ended at 1425 inserted an 18g IV in the right [facility]. 488 grams removed. Patient observed for 30 minutes after procedure without any incident. Patient tolerated procedure well. IV needle removed intact after NS 500cc fluids given see MAR

## 2023-03-08 DIAGNOSIS — J301 Allergic rhinitis due to pollen: Secondary | ICD-10-CM | POA: Diagnosis not present

## 2023-03-08 DIAGNOSIS — J3089 Other allergic rhinitis: Secondary | ICD-10-CM | POA: Diagnosis not present

## 2023-03-08 DIAGNOSIS — J3081 Allergic rhinitis due to animal (cat) (dog) hair and dander: Secondary | ICD-10-CM | POA: Diagnosis not present

## 2023-03-12 ENCOUNTER — Ambulatory Visit: Admit: 2023-03-12 | Payer: Medicare PPO | Admitting: Podiatry

## 2023-03-12 SURGERY — BUNIONECTOMY
Anesthesia: Choice | Site: Toe | Laterality: Left

## 2023-03-19 ENCOUNTER — Ambulatory Visit (HOSPITAL_BASED_OUTPATIENT_CLINIC_OR_DEPARTMENT_OTHER)
Admission: RE | Admit: 2023-03-19 | Discharge: 2023-03-19 | Disposition: A | Discharge status: Home / Self Care | Payer: Medicare PPO | Source: Ambulatory Visit | Attending: Family Medicine | Admitting: Radiology

## 2023-03-19 ENCOUNTER — Ambulatory Visit (HOSPITAL_BASED_OUTPATIENT_CLINIC_OR_DEPARTMENT_OTHER)
Admission: RE | Admit: 2023-03-19 | Discharge: 2023-03-19 | Disposition: A | Discharge status: Home / Self Care | Payer: Medicare PPO | Source: Ambulatory Visit | Admitting: Radiology

## 2023-03-19 DIAGNOSIS — Z1231 Encounter for screening mammogram for malignant neoplasm of breast: Secondary | ICD-10-CM | POA: Diagnosis not present

## 2023-03-19 DIAGNOSIS — Z13820 Encounter for screening for osteoporosis: Secondary | ICD-10-CM

## 2023-03-19 DIAGNOSIS — M85851 Other specified disorders of bone density and structure, right thigh: Secondary | ICD-10-CM | POA: Diagnosis not present

## 2023-03-21 ENCOUNTER — Inpatient Hospital Stay: Payer: Medicare PPO

## 2023-03-21 ENCOUNTER — Encounter: Payer: Medicare PPO | Admitting: Podiatry

## 2023-03-22 DIAGNOSIS — D369 Benign neoplasm, unspecified site: Secondary | ICD-10-CM | POA: Insufficient documentation

## 2023-03-22 DIAGNOSIS — K2970 Gastritis, unspecified, without bleeding: Secondary | ICD-10-CM | POA: Insufficient documentation

## 2023-03-22 DIAGNOSIS — K2930 Chronic superficial gastritis without bleeding: Secondary | ICD-10-CM | POA: Diagnosis not present

## 2023-03-22 DIAGNOSIS — R1013 Epigastric pain: Secondary | ICD-10-CM | POA: Diagnosis not present

## 2023-03-22 DIAGNOSIS — D123 Benign neoplasm of transverse colon: Secondary | ICD-10-CM | POA: Diagnosis not present

## 2023-03-22 DIAGNOSIS — Z1211 Encounter for screening for malignant neoplasm of colon: Secondary | ICD-10-CM | POA: Diagnosis not present

## 2023-03-22 DIAGNOSIS — K317 Polyp of stomach and duodenum: Secondary | ICD-10-CM | POA: Diagnosis not present

## 2023-03-22 HISTORY — DX: Benign neoplasm, unspecified site: D36.9

## 2023-03-22 HISTORY — DX: Gastritis, unspecified, without bleeding: K29.70

## 2023-03-23 ENCOUNTER — Inpatient Hospital Stay

## 2023-03-23 MED ORDER — SODIUM CHLORIDE 0.9 % IV SOLN: Freq: Once | INTRAVENOUS | Status: DC

## 2023-03-23 NOTE — Progress Notes (Signed)
 Yolanda Vargas presents today for phlebotomy per MD orders. Phlebotomy procedure started at 1430 and ended at 1445. 491 grams removed. Patient observed for 15 minutes after procedure without any incident. Patient tolerated procedure well. IV needle removed intact. RIGHT AC

## 2023-03-27 DIAGNOSIS — K2930 Chronic superficial gastritis without bleeding: Secondary | ICD-10-CM | POA: Diagnosis not present

## 2023-03-27 DIAGNOSIS — K317 Polyp of stomach and duodenum: Secondary | ICD-10-CM | POA: Diagnosis not present

## 2023-03-27 DIAGNOSIS — D123 Benign neoplasm of transverse colon: Secondary | ICD-10-CM | POA: Diagnosis not present

## 2023-03-28 DIAGNOSIS — J301 Allergic rhinitis due to pollen: Secondary | ICD-10-CM | POA: Diagnosis not present

## 2023-03-28 DIAGNOSIS — F419 Anxiety disorder, unspecified: Secondary | ICD-10-CM | POA: Diagnosis not present

## 2023-03-28 DIAGNOSIS — J3081 Allergic rhinitis due to animal (cat) (dog) hair and dander: Secondary | ICD-10-CM | POA: Diagnosis not present

## 2023-03-28 DIAGNOSIS — J3089 Other allergic rhinitis: Secondary | ICD-10-CM | POA: Diagnosis not present

## 2023-03-28 DIAGNOSIS — M85851 Other specified disorders of bone density and structure, right thigh: Secondary | ICD-10-CM | POA: Diagnosis not present

## 2023-03-28 DIAGNOSIS — H04129 Dry eye syndrome of unspecified lacrimal gland: Secondary | ICD-10-CM | POA: Diagnosis not present

## 2023-04-02 ENCOUNTER — Inpatient Hospital Stay: Payer: Medicare PPO

## 2023-04-02 ENCOUNTER — Other Ambulatory Visit: Payer: Self-pay

## 2023-04-02 LAB — CBC WITH DIFFERENTIAL (CANCER CENTER ONLY)
Abs Immature Granulocytes: 0.03 10*3/uL (ref 0.00–0.07)
Basophils Absolute: 0.1 10*3/uL (ref 0.0–0.1)
Basophils Relative: 1 %
Eosinophils Absolute: 0.4 10*3/uL (ref 0.0–0.5)
Eosinophils Relative: 6 %
HCT: 39 % (ref 36.0–46.0)
Hemoglobin: 13.4 g/dL (ref 12.0–15.0)
Immature Granulocytes: 0 %
Lymphocytes Relative: 27 %
Lymphs Abs: 1.8 10*3/uL (ref 0.7–4.0)
MCH: 34.3 pg — ABNORMAL HIGH (ref 26.0–34.0)
MCHC: 34.4 g/dL (ref 30.0–36.0)
MCV: 99.7 fL (ref 80.0–100.0)
Monocytes Absolute: 0.6 10*3/uL (ref 0.1–1.0)
Monocytes Relative: 9 %
Neutro Abs: 3.8 10*3/uL (ref 1.7–7.7)
Neutrophils Relative %: 57 %
Platelet Count: 233 10*3/uL (ref 150–400)
RBC: 3.91 MIL/uL (ref 3.87–5.11)
RDW: 12.2 % (ref 11.5–15.5)
WBC Count: 6.7 10*3/uL (ref 4.0–10.5)
nRBC: 0 % (ref 0.0–0.2)
nRBC: 0 /100{WBCs}

## 2023-04-02 LAB — IRON AND TIBC
Iron: 153 ug/dL (ref 28–170)
Saturation Ratios: 50 % — ABNORMAL HIGH (ref 10.4–31.8)
TIBC: 305 ug/dL (ref 250–450)
UIBC: 152 ug/dL

## 2023-04-02 LAB — CMP (CANCER CENTER ONLY)
ALT: 18 U/L (ref 0–44)
AST: 24 U/L (ref 15–41)
Albumin: 4.4 g/dL (ref 3.5–5.0)
Alkaline Phosphatase: 95 U/L (ref 38–126)
Anion gap: 10 (ref 5–15)
BUN: 27 mg/dL — ABNORMAL HIGH (ref 8–23)
CO2: 27 mmol/L (ref 22–32)
Calcium: 9.6 mg/dL (ref 8.9–10.3)
Chloride: 104 mmol/L (ref 98–111)
Creatinine: 0.73 mg/dL (ref 0.44–1.00)
GFR, Estimated: >60 mL/min (ref >60)
Glucose, Bld: 98 mg/dL (ref 70–99)
Potassium: 4.1 mmol/L (ref 3.5–5.1)
Sodium: 140 mmol/L (ref 135–145)
Total Bilirubin: 0.2 mg/dL (ref 0.0–1.2)
Total Protein: 6.6 g/dL (ref 6.5–8.1)

## 2023-04-02 LAB — FERRITIN: Ferritin: 328 ng/mL — ABNORMAL HIGH (ref 11–307)

## 2023-04-03 NOTE — Progress Notes (Unsigned)
 [facility]  [address] [place],  [place]  [address] [phone]  Clinic Day:  04/04/2023  Referring physician: Wilfrid Lund, PA   HISTORY OF PRESENT ILLNESS:  The patient is a 73 y.o. 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  She comes in today to reassess her iron parameters after receiving 4 biweekly phlebotomies.  Overall, she claims to be doing okay.  She denies having any systemic symptoms which concern her for progression of her hemochromatosis.  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  PHYSICAL EXAM:  Blood pressure 119/79, pulse 74, temperature 98.1 F (36.7 C), temperature source Oral, resp. rate 16, height 5' 7.5" (1.715 m), weight 162 lb 9.6 oz (73.8 kg), SpO2 98%. Wt Readings from Last 3 Encounters:  04/04/23 162 lb 9.6 oz (73.8 kg)  02/07/23 164 lb 8 oz (74.6 kg)  01/08/23 160 lb (72.6 kg)   Body mass index is 25.09 kg/m. Performance status (ECOG): 0 - Asymptomatic Physical Exam Constitutional:      Appearance: Normal appearance. She is not ill-appearing.  HENT:     Mouth/Throat:     Mouth: Mucous membranes are moist.     Pharynx: Oropharynx is clear. No oropharyngeal exudate or posterior oropharyngeal erythema.  Cardiovascular:     Rate and Rhythm: Normal rate and regular rhythm.     Heart sounds: No murmur heard.    No friction rub. No gallop.  Pulmonary:     Effort: Pulmonary effort is normal. No respiratory distress.     Breath sounds: Normal breath sounds. No wheezing, rhonchi or rales.  Abdominal:     General: Bowel sounds are normal. There is no distension.     Palpations: Abdomen is soft. There is no mass.     Tenderness: There is no abdominal tenderness.  Musculoskeletal:        General: No swelling.     Right lower leg: No edema.     Left lower leg: No edema.   Lymphadenopathy:     Cervical: No cervical adenopathy.     Upper Body:     Right upper body: No supraclavicular or axillary adenopathy.     Left upper body: No supraclavicular or axillary adenopathy.     Lower Body: No right inguinal adenopathy. No left inguinal adenopathy.  Skin:    General: Skin is warm.     Coloration: Skin is not jaundiced.     Findings: No lesion or rash.  Neurological:     General: No focal deficit present.     Mental Status: She is alert and oriented to person, place, and time. Mental status is at baseline.  Psychiatric:        Mood and Affect: Mood normal.        Behavior: Behavior normal.        Thought Content: Thought content normal.    LABS:      Latest Ref Rng & Units 04/02/2023    8:46 AM 02/05/2023    9:46 AM 12/20/2022    2:29 PM  CBC  WBC 4.0 - 10.5 K/uL 6.7  4.5  5.5   Hemoglobin 12.0 - 15.0 g/dL 91.4  78.2  95.6   Hematocrit 36.0 - 46.0 % 39.0  38.1  42.1   Platelets 150 - 400 K/uL 233  208  198       Latest Ref  Rng & Units 04/02/2023    8:46 AM 02/05/2023    9:46 AM 12/20/2022    2:29 PM  CMP  Glucose 70 - 99 mg/dL 98  161  96   BUN 8 - 23 mg/dL 27  22  19    Creatinine 0.44 - 1.00 mg/dL 0.96  0.45  4.09   Sodium 135 - 145 mmol/L 140  143  142   Potassium 3.5 - 5.1 mmol/L 4.1  4.4  3.7   Chloride 98 - 111 mmol/L 104  106  105   CO2 22 - 32 mmol/L 27  28  27    Calcium 8.9 - 10.3 mg/dL 9.6  9.9  9.6   Total Protein 6.5 - 8.1 g/dL 6.6  7.0  7.0   Total Bilirubin 0.0 - 1.2 mg/dL 0.2  0.3  0.3   Alkaline Phos 38 - 126 U/L 95  95  98   AST 15 - 41 U/L 24  28  29    ALT 0 - 44 U/L 18  20  25     HEMOCHROMATOSIS TESTING: c.845G>A (p.Cys282Tyr) - Detected, homozygous  c.187C>G (p.His63Asp) - Not Detected  c.193A>T (p.Ser65Cys) - Not Detected  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    Latest Reference Range & Units 02/05/23 09:46 04/02/23 08:46  Iron 28 - 170 ug/dL 811 914  UIBC ug/dL 782 956  TIBC 213 - 086  ug/dL 578 469  Saturation Ratios 10.4 - 31.8 % 53 (H) 50 (H)  Ferritin 11 - 307 ng/mL 316 (H) 328 (H)  (H): Data is abnormally high  ASSESSMENT & PLAN:  Assessment/Plan:  A 73 y.o. 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  Despite her recent biweekly phlebotomies, her ferritin level actually increased.  Based upon this, she will be phlebotomized weekly over the next 4 weeks.  As mentioned previously, nonmedical genetic testing was done which apparently showed an increased likelihood of having a JAK2 mutation.  This mutation will be formally checked today to ensure she may not develop a myeloproliferative disorder over time.  I will see her back in 4 weeks to reassess her iron parameters after receiving weekly phlebotomies, as well as her JAK2 mutation status.  The patient understands all the plans discussed today and is in agreement with them.    Katheen Aslin Kirby Funk, MD

## 2023-04-04 ENCOUNTER — Inpatient Hospital Stay: Payer: Medicare PPO | Attending: Oncology | Admitting: Oncology

## 2023-04-04 ENCOUNTER — Telehealth: Payer: Self-pay | Admitting: Oncology

## 2023-04-04 ENCOUNTER — Encounter: Payer: Medicare PPO | Admitting: Podiatry

## 2023-04-04 ENCOUNTER — Other Ambulatory Visit: Payer: Self-pay | Admitting: Oncology

## 2023-04-04 ENCOUNTER — Inpatient Hospital Stay: Payer: Medicare PPO

## 2023-04-04 ENCOUNTER — Inpatient Hospital Stay

## 2023-04-04 ENCOUNTER — Encounter: Payer: Self-pay | Admitting: Oncology

## 2023-04-04 DIAGNOSIS — D751 Secondary polycythemia: Secondary | ICD-10-CM

## 2023-04-04 MED ORDER — SODIUM CHLORIDE 0.9 % IV SOLN
Freq: Once | INTRAVENOUS | Status: AC
Start: 2023-04-04 — End: 2023-04-04

## 2023-04-04 NOTE — Progress Notes (Signed)
 Yolanda Vargas presents today for phlebotomy per MD orders. Phlebotomy procedure started at 1155 and ended at 1223. 404 cc removed. Patient tolerated procedure well. IV needle removed intact.  1340- Patient opts to stop saline at this time and states she is ready for discharge, Denies dizziness. Ambulatory without problem.

## 2023-04-04 NOTE — Telephone Encounter (Signed)
 04/04/23 Spoke with patient and confirmed next appts.

## 2023-04-04 NOTE — Patient Instructions (Signed)

## 2023-04-09 DIAGNOSIS — N952 Postmenopausal atrophic vaginitis: Secondary | ICD-10-CM | POA: Diagnosis not present

## 2023-04-09 DIAGNOSIS — G4700 Insomnia, unspecified: Secondary | ICD-10-CM | POA: Diagnosis not present

## 2023-04-09 DIAGNOSIS — R61 Generalized hyperhidrosis: Secondary | ICD-10-CM | POA: Diagnosis not present

## 2023-04-11 LAB — JAK2 V617F RFX CALR/MPL/E12-15

## 2023-04-11 LAB — CALR +MPL + E12-E15  (REFLEX)

## 2023-04-12 ENCOUNTER — Inpatient Hospital Stay

## 2023-04-12 DIAGNOSIS — I10 Essential (primary) hypertension: Secondary | ICD-10-CM | POA: Diagnosis not present

## 2023-04-12 DIAGNOSIS — E039 Hypothyroidism, unspecified: Secondary | ICD-10-CM | POA: Diagnosis not present

## 2023-04-12 DIAGNOSIS — R7303 Prediabetes: Secondary | ICD-10-CM | POA: Diagnosis not present

## 2023-04-12 DIAGNOSIS — E010 Iodine-deficiency related diffuse (endemic) goiter: Secondary | ICD-10-CM | POA: Diagnosis not present

## 2023-04-12 DIAGNOSIS — E559 Vitamin D deficiency, unspecified: Secondary | ICD-10-CM | POA: Diagnosis not present

## 2023-04-12 MED ORDER — SODIUM CHLORIDE 0.9 % IV SOLN: Freq: Once | INTRAVENOUS | Status: DC

## 2023-04-12 NOTE — Progress Notes (Signed)
 Yolanda Vargas presents today for phlebotomy per MD orders. Phlebotomy procedure started at 1600 and ended at 1630. 501 grams removed. Patient observed for 10 minutes after procedure without any incident. Patient tolerated procedure well. IV needle removed intact.

## 2023-04-12 NOTE — Patient Instructions (Signed)

## 2023-04-13 ENCOUNTER — Other Ambulatory Visit (HOSPITAL_BASED_OUTPATIENT_CLINIC_OR_DEPARTMENT_OTHER): Payer: Self-pay | Admitting: Internal Medicine

## 2023-04-13 DIAGNOSIS — E010 Iodine-deficiency related diffuse (endemic) goiter: Secondary | ICD-10-CM

## 2023-04-19 ENCOUNTER — Inpatient Hospital Stay

## 2023-04-19 NOTE — Progress Notes (Signed)
 Yolanda Vargas presents today for phlebotomy per MD orders. Phlebotomy procedure started at 1550 and ended at 1410. 480 grams removed. Patient observed for 5 minutes after procedure without any incident. Patient tolerated procedure well. IV needle removed intact.

## 2023-04-20 ENCOUNTER — Ambulatory Visit (HOSPITAL_BASED_OUTPATIENT_CLINIC_OR_DEPARTMENT_OTHER)
Admission: RE | Admit: 2023-04-20 | Discharge: 2023-04-20 | Disposition: A | Discharge status: Home / Self Care | Source: Ambulatory Visit | Attending: Internal Medicine | Admitting: Internal Medicine

## 2023-04-20 DIAGNOSIS — E010 Iodine-deficiency related diffuse (endemic) goiter: Secondary | ICD-10-CM

## 2023-04-24 ENCOUNTER — Inpatient Hospital Stay

## 2023-04-24 MED ORDER — SODIUM CHLORIDE 0.9 % IV SOLN: Freq: Once | INTRAVENOUS | Status: DC

## 2023-04-24 NOTE — Progress Notes (Signed)
 Yolanda Vargas presents today for phlebotomy per MD orders. Phlebotomy procedure started at 1540 and ended at 1550. 485 grams removed. Patient observed for 5 minutes after procedure without any incident. Patient tolerated procedure well. IV needle removed intact.

## 2023-04-24 NOTE — Addendum Note (Signed)
 Addended by: Luann Rundle on: 04/24/2023 03:55 PM   Modules accepted: Orders

## 2023-04-25 ENCOUNTER — Encounter

## 2023-05-01 ENCOUNTER — Inpatient Hospital Stay

## 2023-05-01 DIAGNOSIS — D751 Secondary polycythemia: Secondary | ICD-10-CM

## 2023-05-01 LAB — CBC WITH DIFFERENTIAL (CANCER CENTER ONLY)
Abs Immature Granulocytes: 0.01 10*3/uL (ref 0.00–0.07)
Basophils Absolute: 0.1 10*3/uL (ref 0.0–0.1)
Basophils Relative: 1 %
Eosinophils Absolute: 0.4 10*3/uL (ref 0.0–0.5)
Eosinophils Relative: 7 %
HCT: 38.8 % (ref 36.0–46.0)
Hemoglobin: 13 g/dL (ref 12.0–15.0)
Immature Granulocytes: 0 %
Lymphocytes Relative: 25 %
Lymphs Abs: 1.3 10*3/uL (ref 0.7–4.0)
MCH: 33.7 pg (ref 26.0–34.0)
MCHC: 33.5 g/dL (ref 30.0–36.0)
MCV: 100.5 fL — ABNORMAL HIGH (ref 80.0–100.0)
Monocytes Absolute: 0.4 10*3/uL (ref 0.1–1.0)
Monocytes Relative: 8 %
Neutro Abs: 3.3 10*3/uL (ref 1.7–7.7)
Neutrophils Relative %: 59 %
Platelet Count: 224 10*3/uL (ref 150–400)
RBC: 3.86 MIL/uL — ABNORMAL LOW (ref 3.87–5.11)
RDW: 12.9 % (ref 11.5–15.5)
WBC Count: 5.5 10*3/uL (ref 4.0–10.5)
nRBC: 0 % (ref 0.0–0.2)
nRBC: 0 /100{WBCs}

## 2023-05-01 LAB — CMP (CANCER CENTER ONLY)
ALT: 21 U/L (ref 0–44)
AST: 30 U/L (ref 15–41)
Albumin: 4.3 g/dL (ref 3.5–5.0)
Alkaline Phosphatase: 94 U/L (ref 38–126)
Anion gap: 10 (ref 5–15)
BUN: 18 mg/dL (ref 8–23)
CO2: 27 mmol/L (ref 22–32)
Calcium: 10.4 mg/dL — ABNORMAL HIGH (ref 8.9–10.3)
Chloride: 104 mmol/L (ref 98–111)
Creatinine: 0.72 mg/dL (ref 0.44–1.00)
GFR, Estimated: >60 mL/min (ref >60)
Glucose, Bld: 96 mg/dL (ref 70–99)
Potassium: 4.1 mmol/L (ref 3.5–5.1)
Sodium: 141 mmol/L (ref 135–145)
Total Bilirubin: <0.2 mg/dL (ref 0.0–1.2)
Total Protein: 7 g/dL (ref 6.5–8.1)

## 2023-05-01 LAB — IRON AND TIBC
Iron: 122 ug/dL (ref 28–170)
Saturation Ratios: 40 % — ABNORMAL HIGH (ref 10.4–31.8)
TIBC: 308 ug/dL (ref 250–450)
UIBC: 186 ug/dL

## 2023-05-01 LAB — FERRITIN: Ferritin: 231 ng/mL (ref 11–307)

## 2023-05-02 ENCOUNTER — Encounter

## 2023-05-02 DIAGNOSIS — I7300 Raynaud's syndrome without gangrene: Secondary | ICD-10-CM | POA: Diagnosis not present

## 2023-05-02 DIAGNOSIS — R1032 Left lower quadrant pain: Secondary | ICD-10-CM | POA: Diagnosis not present

## 2023-05-02 DIAGNOSIS — R0602 Shortness of breath: Secondary | ICD-10-CM | POA: Diagnosis not present

## 2023-05-02 DIAGNOSIS — R600 Localized edema: Secondary | ICD-10-CM | POA: Diagnosis not present

## 2023-05-02 DIAGNOSIS — R230 Cyanosis: Secondary | ICD-10-CM | POA: Diagnosis not present

## 2023-05-02 DIAGNOSIS — R1031 Right lower quadrant pain: Secondary | ICD-10-CM | POA: Diagnosis not present

## 2023-05-02 DIAGNOSIS — G5603 Carpal tunnel syndrome, bilateral upper limbs: Secondary | ICD-10-CM | POA: Diagnosis not present

## 2023-05-02 DIAGNOSIS — Z8249 Family history of ischemic heart disease and other diseases of the circulatory system: Secondary | ICD-10-CM | POA: Diagnosis not present

## 2023-05-02 NOTE — Progress Notes (Signed)
 [facility]  [address] [place],  [place]  [address] [phone]  Clinic Day:  05/03/2023  Referring physician: Sinda Duel, PA   HISTORY OF PRESENT ILLNESS:  The patient is a 73 y.o. 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  She comes in today to reassess her iron parameters after receiving 4 weekly phlebotomies.  Overall, she claims to be doing okay.  She denies having any systemic symptoms which concern her for progression of her hemochromatosis.  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  PHYSICAL EXAM:  Blood pressure 129/81, pulse 76, temperature 98.1 F (36.7 C), temperature source Oral, resp. rate 16, height 5' 7.5" (1.715 m), weight 158 lb 9.6 oz (71.9 kg), SpO2 97%. Wt Readings from Last 3 Encounters:  05/03/23 158 lb 9.6 oz (71.9 kg)  04/04/23 162 lb 9.6 oz (73.8 kg)  02/07/23 164 lb 8 oz (74.6 kg)   Body mass index is 24.47 kg/m. Performance status (ECOG): 0 - Asymptomatic Physical Exam Constitutional:      Appearance: Normal appearance. She is not ill-appearing.  HENT:     Mouth/Throat:     Mouth: Mucous membranes are moist.     Pharynx: Oropharynx is clear. No oropharyngeal exudate or posterior oropharyngeal erythema.  Cardiovascular:     Rate and Rhythm: Normal rate and regular rhythm.     Heart sounds: No murmur heard.    No friction rub. No gallop.  Pulmonary:     Effort: Pulmonary effort is normal. No respiratory distress.     Breath sounds: Normal breath sounds. No wheezing, rhonchi or rales.  Abdominal:     General: Bowel sounds are normal. There is no distension.     Palpations: Abdomen is soft. There is no mass.     Tenderness: There is no abdominal tenderness.  Musculoskeletal:        General: No swelling.     Right lower leg: No edema.     Left lower leg: No edema.   Lymphadenopathy:     Cervical: No cervical adenopathy.     Upper Body:     Right upper body: No supraclavicular or axillary adenopathy.     Left upper body: No supraclavicular or axillary adenopathy.     Lower Body: No right inguinal adenopathy. No left inguinal adenopathy.  Skin:    General: Skin is warm.     Coloration: Skin is not jaundiced.     Findings: No lesion or rash.  Neurological:     General: No focal deficit present.     Mental Status: She is alert and oriented to person, place, and time. Mental status is at baseline.  Psychiatric:        Mood and Affect: Mood normal.        Behavior: Behavior normal.        Thought Content: Thought content normal.   LABS:      Latest Ref Rng & Units 05/01/2023    9:05 AM 04/02/2023    8:46 AM 02/05/2023    9:46 AM  CBC  WBC 4.0 - 10.5 K/uL 5.5  6.7  4.5   Hemoglobin 12.0 - 15.0 g/dL 29.5  18.8  41.6   Hematocrit 36.0 - 46.0 % 38.8  39.0  38.1   Platelets 150 - 400 K/uL 224  233  208       Latest  Ref Rng & Units 05/01/2023    9:05 AM 04/02/2023    8:46 AM 02/05/2023    9:46 AM  CMP  Glucose 70 - 99 mg/dL 96  98  621   BUN 8 - 23 mg/dL 18  27  22    Creatinine 0.44 - 1.00 mg/dL 3.08  6.57  8.46   Sodium 135 - 145 mmol/L 141  140  143   Potassium 3.5 - 5.1 mmol/L 4.1  4.1  4.4   Chloride 98 - 111 mmol/L 104  104  106   CO2 22 - 32 mmol/L 27  27  28    Calcium 8.9 - 10.3 mg/dL 96.2  9.6  9.9   Total Protein 6.5 - 8.1 g/dL 7.0  6.6  7.0   Total Bilirubin 0.0 - 1.2 mg/dL <9.5  0.2  0.3   Alkaline Phos 38 - 126 U/L 94  95  95   AST 15 - 41 U/L 30  24  28    ALT 0 - 44 U/L 21  18  20     HEMOCHROMATOSIS TESTING: c.845G>A (p.Cys282Tyr) - Detected, homozygous  c.187C>G (p.His63Asp) - Not Detected  c.193A>T (p.Ser65Cys) - Not Detected  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    Latest Reference Range & Units 04/02/23 08:46 05/01/23 09:05  Iron 28 - 170 ug/dL 284 132  UIBC ug/dL 440 102  TIBC 725 - 366 ug/dL  440 347  Saturation Ratios 10.4 - 31.8 % 50 (H) 40 (H)  Ferritin 11 - 307 ng/mL 328 (H) 231  (H): Data is abnormally high  JAK2 V617F Result Comment  Comment: (NOTE) NEGATIVE The JAK2 V617F mutation is not detected in the provided specimen of this individual. Results should be interpreted in conjunction with clinical and other laboratory findings for the most accurate interpretation.   E12-15 Result Comment  Comment: (NOTE) NEGATIVE    JAK2 mutations were not detected in exons 12, 13, 14 and 15.   CALR Result Comment  Comment: (NOTE) NEGATIVE No insertions or deletions were detected within the analyzed region of the calreticulin (CALR) gene.   MPL Result Comment  Comment: (NOTE) NEGATIVE No MPL mutation was identified in the provided specimen of this individual.    ASSESSMENT & PLAN:  Assessment/Plan:  A 73 y.o. 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  Although her weekly phlebotomies have led to an improvement in her iron parameters, her ferritin level is not close to being below 50.  Based upon this, she will be phlebotomized weekly again over the next 4 weeks.  Of note, official JAK2 mutation testing was done, which came back negative, as did calreticulin and MPL mutation testing.  Based upon these results, there is a very low likelihood she will have a myeloproliferative disorder develop over time.  Otherwise, I will see her back in 4 weeks to reassess her iron parameters after receiving additional weekly phlebotomies.  The patient understands all the plans discussed today and is in agreement with them.    Tarun Patchell Felicia Horde, MD

## 2023-05-03 ENCOUNTER — Inpatient Hospital Stay: Admitting: Oncology

## 2023-05-03 ENCOUNTER — Other Ambulatory Visit: Payer: Self-pay | Admitting: Oncology

## 2023-05-03 ENCOUNTER — Telehealth: Payer: Self-pay | Admitting: Oncology

## 2023-05-03 ENCOUNTER — Inpatient Hospital Stay: Attending: Oncology

## 2023-05-03 NOTE — Patient Instructions (Signed)

## 2023-05-03 NOTE — Telephone Encounter (Signed)
 Patient has been scheduled for follow-up visit per 04/3023 LOS.  LVM notifying pt of appt details, provided my direct number to pt if appt changes need to be made.

## 2023-05-06 ENCOUNTER — Encounter: Payer: Self-pay | Admitting: Oncology

## 2023-05-07 ENCOUNTER — Encounter: Payer: Self-pay | Admitting: Cardiology

## 2023-05-07 ENCOUNTER — Encounter: Payer: Self-pay | Admitting: *Deleted

## 2023-05-08 DIAGNOSIS — J3089 Other allergic rhinitis: Secondary | ICD-10-CM | POA: Diagnosis not present

## 2023-05-08 DIAGNOSIS — J301 Allergic rhinitis due to pollen: Secondary | ICD-10-CM | POA: Diagnosis not present

## 2023-05-09 DIAGNOSIS — J3089 Other allergic rhinitis: Secondary | ICD-10-CM | POA: Diagnosis not present

## 2023-05-09 DIAGNOSIS — J301 Allergic rhinitis due to pollen: Secondary | ICD-10-CM | POA: Diagnosis not present

## 2023-05-09 DIAGNOSIS — J3081 Allergic rhinitis due to animal (cat) (dog) hair and dander: Secondary | ICD-10-CM | POA: Diagnosis not present

## 2023-05-10 ENCOUNTER — Inpatient Hospital Stay

## 2023-05-10 NOTE — Progress Notes (Signed)
 Jessika P Comins presents today for phlebotomy per MD orders. Phlebotomy procedure started at 1507 and ended at 1520. 480 grams removed. Patient observed for 5 minutes after procedure without any incident. Patient tolerated procedure well. IV needle removed intact. RIGHT AC

## 2023-05-14 ENCOUNTER — Encounter: Payer: Self-pay | Admitting: Genetic Counselor

## 2023-05-14 DIAGNOSIS — H35363 Drusen (degenerative) of macula, bilateral: Secondary | ICD-10-CM | POA: Diagnosis not present

## 2023-05-14 DIAGNOSIS — H0102B Squamous blepharitis left eye, upper and lower eyelids: Secondary | ICD-10-CM | POA: Diagnosis not present

## 2023-05-14 DIAGNOSIS — H25813 Combined forms of age-related cataract, bilateral: Secondary | ICD-10-CM | POA: Diagnosis not present

## 2023-05-14 DIAGNOSIS — H04123 Dry eye syndrome of bilateral lacrimal glands: Secondary | ICD-10-CM | POA: Diagnosis not present

## 2023-05-15 ENCOUNTER — Inpatient Hospital Stay (HOSPITAL_BASED_OUTPATIENT_CLINIC_OR_DEPARTMENT_OTHER): Admitting: Genetic Counselor

## 2023-05-15 ENCOUNTER — Other Ambulatory Visit: Payer: Self-pay | Admitting: Genetic Counselor

## 2023-05-15 ENCOUNTER — Other Ambulatory Visit: Payer: Self-pay

## 2023-05-15 ENCOUNTER — Inpatient Hospital Stay

## 2023-05-15 DIAGNOSIS — F32A Depression, unspecified: Secondary | ICD-10-CM | POA: Insufficient documentation

## 2023-05-15 DIAGNOSIS — F419 Anxiety disorder, unspecified: Secondary | ICD-10-CM | POA: Insufficient documentation

## 2023-05-15 DIAGNOSIS — E88819 Insulin resistance, unspecified: Secondary | ICD-10-CM | POA: Insufficient documentation

## 2023-05-15 DIAGNOSIS — Z803 Family history of malignant neoplasm of breast: Secondary | ICD-10-CM | POA: Diagnosis not present

## 2023-05-15 DIAGNOSIS — R0602 Shortness of breath: Secondary | ICD-10-CM | POA: Insufficient documentation

## 2023-05-15 DIAGNOSIS — Z8052 Family history of malignant neoplasm of bladder: Secondary | ICD-10-CM | POA: Diagnosis not present

## 2023-05-15 DIAGNOSIS — E049 Nontoxic goiter, unspecified: Secondary | ICD-10-CM | POA: Insufficient documentation

## 2023-05-15 DIAGNOSIS — R0609 Other forms of dyspnea: Secondary | ICD-10-CM | POA: Insufficient documentation

## 2023-05-15 DIAGNOSIS — Z8041 Family history of malignant neoplasm of ovary: Secondary | ICD-10-CM | POA: Insufficient documentation

## 2023-05-15 DIAGNOSIS — I10 Essential (primary) hypertension: Secondary | ICD-10-CM | POA: Insufficient documentation

## 2023-05-15 DIAGNOSIS — K219 Gastro-esophageal reflux disease without esophagitis: Secondary | ICD-10-CM | POA: Insufficient documentation

## 2023-05-15 DIAGNOSIS — R87619 Unspecified abnormal cytological findings in specimens from cervix uteri: Secondary | ICD-10-CM | POA: Insufficient documentation

## 2023-05-15 DIAGNOSIS — J3081 Allergic rhinitis due to animal (cat) (dog) hair and dander: Secondary | ICD-10-CM | POA: Diagnosis not present

## 2023-05-15 DIAGNOSIS — R600 Localized edema: Secondary | ICD-10-CM | POA: Insufficient documentation

## 2023-05-15 DIAGNOSIS — Z1379 Encounter for other screening for genetic and chromosomal anomalies: Secondary | ICD-10-CM

## 2023-05-15 DIAGNOSIS — J45909 Unspecified asthma, uncomplicated: Secondary | ICD-10-CM | POA: Insufficient documentation

## 2023-05-15 DIAGNOSIS — R1032 Left lower quadrant pain: Secondary | ICD-10-CM | POA: Insufficient documentation

## 2023-05-15 DIAGNOSIS — A6920 Lyme disease, unspecified: Secondary | ICD-10-CM | POA: Insufficient documentation

## 2023-05-15 DIAGNOSIS — K760 Fatty (change of) liver, not elsewhere classified: Secondary | ICD-10-CM | POA: Insufficient documentation

## 2023-05-15 DIAGNOSIS — R1031 Right lower quadrant pain: Secondary | ICD-10-CM | POA: Insufficient documentation

## 2023-05-15 DIAGNOSIS — R230 Cyanosis: Secondary | ICD-10-CM | POA: Insufficient documentation

## 2023-05-15 DIAGNOSIS — J301 Allergic rhinitis due to pollen: Secondary | ICD-10-CM | POA: Diagnosis not present

## 2023-05-15 DIAGNOSIS — J3089 Other allergic rhinitis: Secondary | ICD-10-CM | POA: Diagnosis not present

## 2023-05-15 DIAGNOSIS — N8110 Cystocele, unspecified: Secondary | ICD-10-CM | POA: Insufficient documentation

## 2023-05-15 DIAGNOSIS — I7300 Raynaud's syndrome without gangrene: Secondary | ICD-10-CM | POA: Insufficient documentation

## 2023-05-15 DIAGNOSIS — Z8249 Family history of ischemic heart disease and other diseases of the circulatory system: Secondary | ICD-10-CM | POA: Insufficient documentation

## 2023-05-15 LAB — GENETIC SCREENING ORDER

## 2023-05-15 NOTE — Progress Notes (Signed)
 REFERRING PROVIDER: Evangeline Hilts, MD [phone] N. [address] [place],  [place] [address]  PRIMARY PROVIDER:  Faustina Hood, MD  PRIMARY REASON FOR VISIT:  1. Family history of breast cancer   2. Family history of ovarian cancer   3. Family history of bladder cancer      HISTORY OF PRESENT ILLNESS:   Yolanda Vargas, a 73 y.o. female, was seen for a [facility] cancer genetics consultation at the request of Dr. Kimble Pennant due to a family history of cancer.  Yolanda Vargas presents to clinic today to discuss the possibility of a hereditary predisposition to cancer, genetic testing, and to further clarify her future cancer risks, as well as potential cancer risks for family members.   Yolanda Vargas is a 73 y.o. female with no personal history of cancer.  She was recently diagnosed with Hereditary Hemochromatosis, and was referred for her family history of cancer.  CANCER HISTORY:  Oncology History   No history exists.     RISK FACTORS:  Menarche was at age 37.  First live birth at age 17.  OCP use for approximately 15+ years.  Ovaries intact: no.  Hysterectomy: yes.  Menopausal status: postmenopausal.  HRT use: 9 years. Colonoscopy: yes; normal. Mammogram within the last year: yes. Number of breast biopsies: 1. Up to date with pelvic exams: yes. 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   Surgery had to be postponed     Depression    Dyspepsia 03/25/1997   One of my most prominent health issues, diagnosed at this time     Excessive vitamin B12 intake 06/02/2021   Lab reports show excessive Vitamin B12     Family history of bladder cancer    Family  history of breast cancer    Family history of heart disease    Family history of ovarian cancer    Fatty (change of) liver, not elsewhere classified 08/30/2015   Female bladder prolapse    Gastritis 03/22/2023   First detected, during EGD     Gastroesophageal reflux disease without esophagitis 12/04/2016   Generalized anxiety disorder 03/05/2017   GERD (gastroesophageal reflux disease)    Hemochromatosis 01/08/2023   Hereditary hemochromatosis (HCC)    Hypertension    Hypothyroidism    Insulin resistance    Kidney stone 07/20/2022   Punctuate in size, right kidney     Left groin pain    Lyme disease    around 2009   Moderate persistent asthma 08/31/2015   Sylvester Evert     NAFL (nonalcoholic fatty liver)    Osteopenia    Peripheral edema    Raynaud's phenomenon 12/30/2008   Raynaud's phenomenon without gangrene    Recurrent UTI 08/17/2022   Right groin pain    Seasonal allergic rhinitis 02/24/2014   Shortness of breath    Thyroid  goiter    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     Vaginal prolapse     Past Surgical History:  Procedure Laterality Date   BREAST BIOPSY Left    2018   CERVICAL CONE BIOPSY  CHOLECYSTECTOMY     COLONOSCOPY     ESOPHAGOGASTRODUODENOSCOPY     KNEE SURGERY     Bursa removed   LAPAROSCOPIC HYSTERECTOMY  2020    Social History   Socioeconomic History   Marital status: Divorced    Spouse name: Not on file   Number of children: 3   Years of education: 12+6+60 hours   Highest education level: Master's degree (e.g., MA, MS, MEng, MEd, MSW, MBA)  Occupational History    Comment: RETIRED  Tobacco Use   Smoking status: Never   Smokeless tobacco: Never  Vaping Use   Vaping status: Never Used  Substance and Sexual Activity   Alcohol use: Not Currently   Drug use: Never   Sexual activity: Not Currently    Partners: Male    Birth control/protection: Surgical    Comment: first sexual encounter at 3, less than 5,  hysterectomy  Other Topics Concern   Not on file  Social History Narrative   Not on file   Social Drivers of Health   Financial Resource Strain: Not on file  Food Insecurity: No Food Insecurity (12/20/2022)   Hunger Vital Sign    Worried About Running Out of Food in the Last Year: Never true    Ran Out of Food in the Last Year: Never true  Transportation Needs: No Transportation Needs (12/20/2022)   PRAPARE - Administrator, Civil Service (Medical): No    Lack of Transportation (Non-Medical): No  Physical Activity: Not on file  Stress: Not on file  Social Connections: Unknown (05/17/2021)   [facility]   Social Network    Social Network: Not on file     FAMILY HISTORY:  We obtained a detailed, 4-generation family history.  Significant diagnoses are listed below: Family History  Problem Relation Age of Onset   Breast cancer Mother 43       bilateral   Diabetes Father    Heart attack Father    Coronary artery disease Father    Ovarian cancer Sister 55   Bladder Cancer Sister 76   Heart disease Brother    Hypertension Brother    Cancer Maternal Aunt        Eye   Esophageal cancer Maternal Aunt    Polycythemia Maternal Uncle    Lung cancer Paternal Aunt 67   Pneumonia Maternal Grandmother    Congestive Heart Failure Paternal Grandfather    Cancer Cousin 26       unknown     The patient has three children who are cancer free.  She has a brother and three sisters.  Her sister had both bladder and ovarian cancer at 46.  Both parents are deceased.  The patient's mother had bilateral breast cancer at 83.  She had a brother and four sisters.  One sister had eye cancer and one sister had esophageal cancer.  The patient's father had one sister who had lung cancer.  Her son had an unknown cancer at 20.    Yolanda Vargas is unaware of previous family history of genetic testing for hereditary cancer risks. There is no reported Ashkenazi  Jewish ancestry. There is no known consanguinity.  GENETIC COUNSELING ASSESSMENT: Yolanda Vargas is a 73 y.o. female with a family history of cancer which is somewhat suggestive of a hereditary cancer syndrome and predisposition to cancer given the combination of cancer and young age of onset. We, therefore, discussed and recommended the following at today's visit.  DISCUSSION: We discussed that, in general, most cancer is not inherited in families, but instead is sporadic or familial. Sporadic cancers occur by chance and typically happen at older ages (>50 years) as this type of cancer is caused by genetic changes acquired during an individual's lifetime. Some families have more cancers than would be expected by chance; however, the ages or types of cancer are not consistent with a known genetic mutation or known genetic mutations have been ruled out. This type of familial cancer is thought to be due to a combination of multiple genetic, environmental, hormonal, and lifestyle factors. While this combination of factors likely increases the risk of cancer, the exact source of this risk is not currently identifiable or testable.  We discussed that 5 - 10% of breast cancer, and up to 20% of ovarian cancer is hereditary, with most cases associated with BRCA mutations.  There are other genes that can be associated with hereditary breast cancer syndromes.  These include ATM, CHEK2 and PALB2. Some of these genes also increase the risk for ovarian cancer.  We discussed that testing is beneficial for several reasons including knowing how to follow individuals after completing their treatment, identifying whether potential treatment options such as PARP inhibitors would be beneficial, and understand if other family members could be at risk for cancer and allow them to undergo genetic testing.   We reviewed the characteristics, features and inheritance patterns of hereditary cancer syndromes. We also discussed genetic  testing, including the appropriate family members to test, the process of testing, insurance coverage and turn-around-time for results. We discussed the implications of a negative, positive, carrier and/or variant of uncertain significant result. Yolanda Vargas  was offered a common hereditary cancer panel (36+ genes) and an expanded pan-cancer panel (70+ genes). Yolanda Vargas was informed of the benefits and limitations of each panel, including that expanded pan-cancer panels contain genes that do not have clear management guidelines at this point in time.  We also discussed that as the number of genes included on a panel increases, the chances of variants of uncertain significance increases. Yolanda Vargas decided to pursue genetic testing for the CancerNext-Expanded+RNAinsight gene panel.   The CancerNext-Expanded gene panel offered by [facility] and includes sequencing, rearrangement, and RNA analysis for the following 77 genes: AIP, ALK, APC, ATM, BAP1, BARD1, BMPR1A, BRCA1, BRCA2, BRIP1, CDC73, CDH1, CDK4, CDKN1B, CDKN2A, CEBPA, CHEK2, CTNNA1, DDX41, DICER1, ETV6, FH, FLCN, GATA2, LZTR1, MAX, MBD4, MEN1, MET, MLH1, MSH2, MSH3, MSH6, MUTYH, NF1, NF2, NTHL1, PALB2, PHOX2B, PMS2, POT1, PRKAR1A, PTCH1, PTEN, RAD51C, RAD51D, RB1, RET, RPS20, RUNX1, SDHA, SDHAF2, SDHB, SDHC, SDHD, SMAD4, SMARCA4, SMARCB1, SMARCE1, STK11, SUFU, TMEM127, TP53, TSC1, TSC2, VHL, and WT1 (sequencing and deletion/duplication); AXIN2, CTNNA1, DDX41, EGFR, HOXB13, KIT, MBD4, MITF, MSH3, PDGFRA, POLD1 and POLE (sequencing only); EPCAM and GREM1 (deletion/duplication only). RNA data is routinely analyzed for use in variant interpretation for all genes.   Based on Yolanda Vargas's family history of cancer, she meets NCCN criteria for genetic testing. Though Yolanda Vargas is not personally affected, there are no affected family members that are willing/able/available to undergo hereditary cancer testing.  Therefore, Yolanda Vargas the most informative family  member available.  Despite that she meets criteria, she may still have an out of pocket cost. We discussed that if her out of pocket cost for testing is over $100, the laboratory will call and confirm whether she wants to proceed with testing.  If the out of pocket cost of testing is less than $  100 she will be billed by the genetic testing laboratory.   We discussed that some people do not want to undergo genetic testing due to fear of genetic discrimination.  The Genetic Information Nondiscrimination Act (GINA) was signed into federal law in 2008. GINA prohibits health insurers and most employers from discriminating against individuals based on genetic information (including the results of genetic tests and family history information). According to GINA, health insurance companies cannot consider genetic information to be a preexisting condition, nor can they use it to make decisions regarding coverage or rates. GINA also makes it illegal for most employers to use genetic information in making decisions about hiring, firing, promotion, or terms of employment. It is important to note that GINA does not offer protections for life insurance, disability insurance, or long-term care insurance. GINA does not apply to those in the [employer], those who work for companies with less than 15 employees, and new life insurance or long-term disability insurance policies.  Health status due to a cancer diagnosis is not protected under GINA. More information about GINA can be found by visiting [email or web address].  PLAN: After considering the risks, benefits, and limitations, Yolanda Vargas provided informed consent to pursue genetic testing and the blood sample was sent to [facility] for analysis of the CancerNext-Expanded+RNAinsight. Results should be available within approximately 2-3 weeks' time, at which point they will be disclosed by telephone to Yolanda Vargas, as will any additional recommendations warranted by  these results. Yolanda Vargas will receive a summary of her genetic counseling visit and a copy of her results once available. This information will also be available in Epic.   Lastly, we encouraged Yolanda Vargas to remain in contact with cancer genetics annually so that we can continuously update the family history and inform her of any changes in cancer genetics and testing that may be of benefit for this family.   Yolanda Vargas questions were answered to her satisfaction today. Our contact information was provided should additional questions or concerns arise. Thank you for the referral and allowing us  to share in the care of your patient.   Zack Crager P. [place], MS, CGC Licensed, Patent attorney [email or web address] phone: [phone]  60 minutes were spent on the date of the encounter in service to the patient including preparation, face-to-face consultation, documentation and care coordination.  The patient was seen alone.  Drs. Johnna Nakai, and/or Gudena were available for questions, if needed..    _______________________________________________________________________ For Office Staff:  Number of people involved in session: 1 Was an Intern/ student involved with case: no

## 2023-05-17 ENCOUNTER — Inpatient Hospital Stay

## 2023-05-17 MED ORDER — SODIUM CHLORIDE 0.9 % IV SOLN: Freq: Once | INTRAVENOUS | Status: DC

## 2023-05-17 NOTE — Progress Notes (Signed)
 Yolanda Vargas presents today for phlebotomy per MD orders. Phlebotomy procedure started at 1510 and ended at 1525. 490 grams removed. Patient observed for 5 minutes after procedure without any incident. Patient tolerated procedure well. IV needle removed intact. RIGHT AC

## 2023-05-17 NOTE — Patient Instructions (Signed)

## 2023-05-18 ENCOUNTER — Ambulatory Visit: Admitting: Cardiology

## 2023-05-23 DIAGNOSIS — J3081 Allergic rhinitis due to animal (cat) (dog) hair and dander: Secondary | ICD-10-CM | POA: Diagnosis not present

## 2023-05-23 DIAGNOSIS — J3089 Other allergic rhinitis: Secondary | ICD-10-CM | POA: Diagnosis not present

## 2023-05-23 DIAGNOSIS — M25551 Pain in right hip: Secondary | ICD-10-CM | POA: Diagnosis not present

## 2023-05-23 DIAGNOSIS — M545 Low back pain, unspecified: Secondary | ICD-10-CM | POA: Diagnosis not present

## 2023-05-23 DIAGNOSIS — M25552 Pain in left hip: Secondary | ICD-10-CM | POA: Diagnosis not present

## 2023-05-23 DIAGNOSIS — J301 Allergic rhinitis due to pollen: Secondary | ICD-10-CM | POA: Diagnosis not present

## 2023-05-24 ENCOUNTER — Ambulatory Visit (HOSPITAL_BASED_OUTPATIENT_CLINIC_OR_DEPARTMENT_OTHER): Admission: EM | Admit: 2023-05-24 | Discharge: 2023-05-24 | Disposition: A | Discharge status: Home / Self Care

## 2023-05-24 ENCOUNTER — Encounter (HOSPITAL_BASED_OUTPATIENT_CLINIC_OR_DEPARTMENT_OTHER): Payer: Self-pay | Admitting: Emergency Medicine

## 2023-05-24 ENCOUNTER — Ambulatory Visit (HOSPITAL_BASED_OUTPATIENT_CLINIC_OR_DEPARTMENT_OTHER)

## 2023-05-24 ENCOUNTER — Inpatient Hospital Stay

## 2023-05-24 DIAGNOSIS — M79641 Pain in right hand: Secondary | ICD-10-CM | POA: Diagnosis not present

## 2023-05-24 DIAGNOSIS — L819 Disorder of pigmentation, unspecified: Secondary | ICD-10-CM

## 2023-05-24 DIAGNOSIS — G5601 Carpal tunnel syndrome, right upper limb: Secondary | ICD-10-CM

## 2023-05-24 MED ORDER — SODIUM CHLORIDE 0.9 % IV SOLN: Freq: Once | INTRAVENOUS | Status: DC

## 2023-05-24 NOTE — ED Triage Notes (Signed)
 Pt had a sharp pain in her right hand over the weekend and after she has a spot on her hand that is red and a knot.

## 2023-05-24 NOTE — Discharge Instructions (Signed)
 Patient has some complex medical conditions.  She has hereditary hemochromatosis and gets therapeutic phlebotomy weekly for now.  She has had a previous diagnosis of Raynaud's syndrome in her hands.  She has had some significant swelling and discoloration of her right second finger and some on the webspace of her right hand between the 1st and 2nd fingers near the navicular bone.  She has photographs of all of these but there is no current significant discoloration nor swelling.  She also has carpal tunnel syndrome in the right and may also have it in the left.  She wondered if she could get ultrasound to workup her concerns about these vascular changes.  I explained that if she had an acute event where finger or hand or foot was cold, purple or not having good circulation that could create a need for an urgent or emergent arterial vascular study.  Regarding venous studies, I felt that she would need to be having an acute problem that might be suspected to be a clot or even Raynaud's to get acute workup.  Encouraged her to speak to her primary care and seek referral to a vascular surgeon for further discussion and possible workup.  She actually has done that and her primary care felt she should see cardiology first.  She has an appointment in a week to see Dr. Gordan Latina at [facility]-.  Follow-up as needed.

## 2023-05-24 NOTE — ED Provider Notes (Signed)
 [facility]    CSN: [id number] Arrival date & time: 05/24/23  1727      History   Chief Complaint Chief Complaint  Patient presents with   Hand Pain    HPI Yolanda Vargas is a 73 y.o. female.   Patient is here today with concerns about pain in her right hand between the thumb and the first finger in the webspace near the navicular bone.  She has had some discoloration there and she had 1 area that was like a small bump or even pimple.  This bump came up suddenly and she does not know if it was an insect bite or what caused it but it was discolored kind of a dark red almost purple and there was purple tissue changes around it or even bruising.  The patient has a complex medical history.  She has hereditary hemochromatosis, a previous diagnosis of Raynaud's phenomenon (she s not sure if she actually has Raynaud's), carpal tunnel syndrome on the right and perhaps carpal tunnel syndrome on the left, previous history of Lyme disease and chronic arthralgias.  She feels like she has some kind of peripheral vascular disease and perhaps even an inflammatory vascular disease.  She had asked her primary care provider to send her to a vascular specialist for workup.  Her primary care provider made her an appointment with Dr. Arthea Larsson and he has had to reschedule so she cannot see him for about 10 days.  She was reading on the Internet and she felt like what she read indicated the place on her right hand could be dangerous and might need immediate ultrasound workup.  She is not having any active pain right now.  She has not had a fever.  She has multiple photographs on her phone of some discoloration of the second finger on her right hand and areas where she has had some bruising or discoloration under the skin of the right hand.   Hand Pain Pertinent negatives include no chest pain and no abdominal pain.    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   Surgery had to be postponed     Depression    Dyspepsia 03/25/1997   One of my most prominent health issues, diagnosed at this time     Excessive vitamin B12 intake 06/02/2021   Lab reports show excessive Vitamin B12     Family history of bladder cancer    Family history of breast cancer    Family history of heart disease    Family history of ovarian cancer    Fatty (change of) liver, not elsewhere classified 08/30/2015   Female bladder prolapse    Gastritis 03/22/2023   First detected, during EGD     Gastroesophageal reflux disease without esophagitis 12/04/2016   Generalized anxiety disorder 03/05/2017   GERD (gastroesophageal reflux disease)    Hemochromatosis 01/08/2023   Hereditary hemochromatosis (HCC)    Hypertension    Hypothyroidism    Insulin resistance    Kidney stone 07/20/2022   Punctuate in size, right kidney     Left groin pain    Lyme disease  around 2009   Moderate persistent asthma 08/31/2015   Sylvester Evert     NAFL (nonalcoholic fatty liver)    Osteopenia    Peripheral edema    Raynaud's phenomenon 12/30/2008   Raynaud's phenomenon without gangrene    Recurrent UTI 08/17/2022   Right groin pain    Seasonal allergic rhinitis 02/24/2014   Shortness of breath    Thyroid  goiter    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     Vaginal prolapse     Patient Active Problem List   Diagnosis Date Noted   Abnormal Pap smear of cervix    Anxiety    Asthma    Bluish skin    Depression    Family history of heart disease    Female bladder prolapse    GERD (gastroesophageal reflux disease)    Hereditary hemochromatosis (HCC)    Hypertension    Insulin resistance    Left groin pain    Lyme  disease    NAFL (nonalcoholic fatty liver)    Peripheral edema    Raynaud's phenomenon without gangrene    Right groin pain    Shortness of breath    Thyroid  goiter    Vaginal prolapse    Family history of breast cancer    Family history of ovarian cancer    Family history of bladder cancer    Adenomatous polyps 03/22/2023   Gastritis 03/22/2023   Hemochromatosis 01/08/2023   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   Recurrent UTI 08/17/2022   Kidney stone 07/20/2022   Excessive vitamin B12 intake 06/02/2021   Osteopenia 09/11/2017   Generalized anxiety disorder 03/05/2017   Gastroesophageal reflux disease without esophagitis 12/04/2016   Benign hypertension 04/26/2016   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   Moderate persistent asthma 08/31/2015   Fatty (change of) liver, not elsewhere classified 08/30/2015   Abnormal mammogram 04/12/2015   Arthropathy 09/29/2014   Hypothyroidism 02/24/2014   Seasonal allergic rhinitis 02/24/2014   Raynaud's phenomenon 12/30/2008   Dyspepsia 03/25/1997    Past Surgical History:  Procedure Laterality Date   BREAST BIOPSY Left    2018   CERVICAL CONE BIOPSY     CHOLECYSTECTOMY     COLONOSCOPY     ESOPHAGOGASTRODUODENOSCOPY     KNEE SURGERY     Bursa removed   LAPAROSCOPIC HYSTERECTOMY  2020    OB History     Gravida  3   Para  3   Term  3   Preterm      AB      Living  3      SAB      IAB      Ectopic      Multiple      Live Births               Home Medications    Prior to Admission medications   Medication Sig Start Date End Date Taking? Authorizing Provider  amLODipine (NORVASC) 5 MG tablet Take 5 mg by mouth daily. 07/04/18  Yes [provider]  levothyroxine (SYNTHROID) 75 MCG tablet Take 75 mcg by mouth daily before breakfast.   Yes [provider]  omeprazole (PRILOSEC) 40 MG capsule Take 40 mg by mouth in the morning and at bedtime. 03/02/23  Yes  [provider]  rosuvastatin (CRESTOR) 10 MG tablet Take 10 mg by mouth at bedtime. 06/11/18  Yes [provider]  sertraline (ZOLOFT) 50 MG tablet Take 75 mg  by mouth daily. 03/28/23  Yes [provider]  albuterol  (VENTOLIN  HFA) 108 (90 Base) MCG/ACT inhaler Inhale 2 puffs into the lungs every 6 (six) hours as needed for wheezing or shortness of breath. 12/06/22   Hunsucker, Archer Kobs, MD  budesonide -formoterol  (SYMBICORT ) 160-4.5 MCG/ACT inhaler Inhale 2 puffs into the lungs 2 (two) times daily. 12/06/22   Hunsucker, Archer Kobs, MD  fluticasone  (FLONASE ) 50 MCG/ACT nasal spray Place 1 spray into both nostrils daily. 12/06/22   Hunsucker, Archer Kobs, MD  levocetirizine (XYZAL) 5 MG tablet Take 5 mg by mouth daily. 04/14/23   [provider]    Family History Family History  Problem Relation Age of Onset   Breast cancer Mother 61       bilateral   Diabetes Father    Heart attack Father    Coronary artery disease Father    Ovarian cancer Sister 81   Bladder Cancer Sister 80   Heart disease Brother    Hypertension Brother    Cancer Maternal Aunt        Eye   Esophageal cancer Maternal Aunt    Polycythemia Maternal Uncle    Lung cancer Paternal Aunt 47   Pneumonia Maternal Grandmother    Congestive Heart Failure Paternal Grandfather    Cancer Cousin 45       unknown    Social History Social History   Tobacco Use   Smoking status: Never   Smokeless tobacco: Never  Vaping Use   Vaping status: Never Used  Substance Use Topics   Alcohol use: Not Currently   Drug use: Never     Allergies   Diltiazem hcl, Penicillins, Latex, Doxycycline, Hydrocodone, and Lisinopril   Review of Systems Review of Systems  Constitutional:  Negative for fever.  Respiratory:  Negative for cough.   Cardiovascular:  Negative for chest pain.  Gastrointestinal:  Negative for abdominal pain, constipation, diarrhea, nausea and vomiting.  Musculoskeletal:  Positive for  arthralgias. Negative for back pain.  Skin:  Positive for color change and wound (To the surface of the right hand versus a vascular change.). Negative for rash.  Neurological:  Negative for syncope.  All other systems reviewed and are negative.    Physical Exam Triage Vital Signs ED Triage Vitals  Encounter Vitals Group     BP 05/24/23 1746 118/81     Systolic BP Percentile --      Diastolic BP Percentile --      Pulse Rate 05/24/23 1746 89     Resp 05/24/23 1746 18     Temp 05/24/23 1746 98.4 F (36.9 C)     Temp Source 05/24/23 1746 Oral     SpO2 05/24/23 1746 98 %     Weight --      Height --      Head Circumference --      Peak Flow --      Pain Score 05/24/23 1744 0     Pain Loc --      Pain Education --      Exclude from Growth Chart --    No data found.  Updated Vital Signs BP 118/81 (BP Location: Left Arm)   Pulse 89   Temp 98.4 F (36.9 C) (Oral)   Resp 18   SpO2 98%   Visual Acuity Right Eye Distance:   Left Eye Distance:   Bilateral Distance:    Right Eye Near:   Left Eye Near:    Bilateral Near:  Physical Exam Vitals and nursing note reviewed.  Constitutional:      General: She is not in acute distress.    Appearance: She is well-developed. She is not ill-appearing or toxic-appearing.  HENT:     Head: Normocephalic and atraumatic.     Right Ear: Hearing, tympanic membrane, ear canal and external ear normal.     Left Ear: Hearing, tympanic membrane, ear canal and external ear normal.     Nose: No congestion or rhinorrhea.     Right Sinus: No maxillary sinus tenderness or frontal sinus tenderness.     Left Sinus: No maxillary sinus tenderness or frontal sinus tenderness.     Mouth/Throat:     Lips: Pink.     Mouth: Mucous membranes are moist.     Pharynx: Uvula midline. No oropharyngeal exudate or posterior oropharyngeal erythema.     Tonsils: No tonsillar exudate.  Eyes:     Conjunctiva/sclera: Conjunctivae normal.     Pupils: Pupils  are equal, round, and reactive to light.  Cardiovascular:     Rate and Rhythm: Normal rate and regular rhythm.     Heart sounds: S1 normal and S2 normal. No murmur heard. Pulmonary:     Effort: Pulmonary effort is normal. No respiratory distress.     Breath sounds: Normal breath sounds. No decreased breath sounds, wheezing, rhonchi or rales.  Abdominal:     General: Bowel sounds are normal.     Palpations: Abdomen is soft.     Tenderness: There is no abdominal tenderness.  Musculoskeletal:        General: No swelling.     Cervical back: Neck supple.  Lymphadenopathy:     Head:     Right side of head: No submental, submandibular, tonsillar, preauricular or posterior auricular adenopathy.     Left side of head: No submental, submandibular, tonsillar, preauricular or posterior auricular adenopathy.     Cervical: No cervical adenopathy.     Right cervical: No superficial cervical adenopathy.    Left cervical: No superficial cervical adenopathy.  Skin:    General: Skin is warm and dry.     Capillary Refill: Capillary refill takes less than 2 seconds.     Findings: No rash.     Comments: Skin of right hand shows some deeper coloration that could be a mild ecchymosis on the dorsal surface of the right hand in the webspace between the 1st and 2nd finger near the navicular bone.  This is approximately 2 cm x 1 cm in size.  There is no puncture wound that I can find.  Her right second finger appears normal today.  But she definitely has some photographs where the right second finger is almost purple and the other fingers appear more normal.  Neurological:     Mental Status: She is alert and oriented to person, place, and time.  Psychiatric:        Mood and Affect: Mood normal.     UC Treatments / Results  Labs (all labs ordered are listed, but only abnormal results are displayed) Labs Reviewed - No data to display  EKG   Radiology No results found.  Procedures Procedures (including  critical care time)  Medications Ordered in UC Medications - No data to display  Initial Impression / Assessment and Plan / UC Course  I have reviewed the triage vital signs and the nursing notes.  Pertinent labs & imaging results that were available during my care of the patient were reviewed by  me and considered in my medical decision making (see chart for details).  Plan of Care: Discoloration of the skin of the right second finger with pain, hereditary hemochromatosis and carpal tunnel syndrome of right hand.  The patient had called before she came and I had been question about whether or not she could have an ultrasound I was asked if she could have an ultrasound tomorrow since the ultrasound tech had left by the time she called around 5:00.  I had advised that if she was seen tomorrow in the provider working tomorrow thought she needed an ultrasound it was certainly possible as long as she came before 4:30 PM tomorrow.  The patient had understood from her side of the conversation that if she came tonight I would definitely order an ultrasound to be done tomorrow.  After some discussion, I explained that I had understood she would be here tomorrow and I had not committed to anything but that a provider could evaluate her.  Further I did not find any condition today that would warrant a need for an ultrasound.  I agree that she has intermittent discoloration in her hand.  She may need a vascular workup.  But she does not need any urgent emergent ultrasound tonight or tomorrow when there is absolutely no symptoms right now no matter what symptoms she has had previously.  I explained the benefits and indications for ultrasound.  She has great pulses so it is not for arterial compromise.  She does not have any swelling or active discoloration of her right hand at this time.  Follow-up with Dr. Krasowski as planned.  Is a cardiologist Dr. Johannah Mutters can provide additional input as to whether he sees  a value or benefit in further vascular workup.  Follow-up as needed or if symptoms recur or worsen.  I reviewed the plan of care with the patient and/or the patient's guardian.  The patient and/or guardian had time to ask questions and acknowledged that the questions were answered.  I provided instruction on symptoms or reasons to return here or to go to an ER, if symptoms/condition did not improve, worsened or if new symptoms occurred.   I spent 45 minutes on patient care, including face to face time and care planning/patient management.  Quite a bit of time was spent in the patient providing her complex medical history, and me trying to elicit what exactly she was concerned about medically today, and in me trying to explain benefits and uses of certain test, and finally in trying to educate her on the plan of care (which is no intervention at this time). Final Clinical Impressions(s) / UC Diagnoses   Final diagnoses:  Discoloration of skin of finger  Hemochromatosis, hereditary (HCC)  Right hand pain  Carpal tunnel syndrome of right wrist     Discharge Instructions      Patient has some complex medical conditions.  She has hereditary hemochromatosis and gets therapeutic phlebotomy weekly for now.  She has had a previous diagnosis of Raynaud's syndrome in her hands.  She has had some significant swelling and discoloration of her right second finger and some on the webspace of her right hand between the 1st and 2nd fingers near the navicular bone.  She has photographs of all of these but there is no current significant discoloration nor swelling.  She also has carpal tunnel syndrome in the right and may also have it in the left.  She wondered if she could get ultrasound to  workup her concerns about these vascular changes.  I explained that if she had an acute event where finger or hand or foot was cold, purple or not having good circulation that could create a need for an urgent or emergent  arterial vascular study.  Regarding venous studies, I felt that she would need to be having an acute problem that might be suspected to be a clot or even Raynaud's to get acute workup.  Encouraged her to speak to her primary care and seek referral to a vascular surgeon for further discussion and possible workup.  She actually has done that and her primary care felt she should see cardiology first.  She has an appointment in a week to see Dr. Gordan Latina at [facility].  Follow-up as needed.   ED Prescriptions   None    PDMP not reviewed this encounter.   Guss Legacy, FNP 05/25/23 [id number]

## 2023-05-24 NOTE — Progress Notes (Signed)
 Louan P Aslin presents today for phlebotomy per MD orders. Phlebotomy procedure started at 1507 and ended at . 1538. 483 cc removed. Patient tolerated procedure well. IV needle removed intact.

## 2023-05-24 NOTE — Patient Instructions (Signed)

## 2023-05-29 ENCOUNTER — Encounter: Payer: Self-pay | Admitting: Genetic Counselor

## 2023-05-29 ENCOUNTER — Telehealth: Payer: Self-pay | Admitting: Genetic Counselor

## 2023-05-29 ENCOUNTER — Ambulatory Visit: Payer: Self-pay | Admitting: Genetic Counselor

## 2023-05-29 DIAGNOSIS — J3081 Allergic rhinitis due to animal (cat) (dog) hair and dander: Secondary | ICD-10-CM | POA: Diagnosis not present

## 2023-05-29 DIAGNOSIS — J301 Allergic rhinitis due to pollen: Secondary | ICD-10-CM | POA: Diagnosis not present

## 2023-05-29 DIAGNOSIS — J3089 Other allergic rhinitis: Secondary | ICD-10-CM | POA: Diagnosis not present

## 2023-05-29 DIAGNOSIS — Z1379 Encounter for other screening for genetic and chromosomal anomalies: Secondary | ICD-10-CM

## 2023-05-29 DIAGNOSIS — M160 Bilateral primary osteoarthritis of hip: Secondary | ICD-10-CM | POA: Diagnosis not present

## 2023-05-29 HISTORY — DX: Encounter for other screening for genetic and chromosomal anomalies: Z13.79

## 2023-05-29 NOTE — Telephone Encounter (Signed)
Revealed negative genetic testing.  Discussed that we do not know why there is cancer in the family. It could be due to a different gene that we are not testing, or maybe our current technology may not be able to pick something up.  It will be important for her to keep in contact with genetics to keep up with whether additional testing may be needed.  

## 2023-05-29 NOTE — Telephone Encounter (Signed)
 LM on VM that results are back and to please call.  Left CB instructions.

## 2023-05-29 NOTE — Progress Notes (Signed)
 HPI:  [name] was previously seen in the [facility] due to a family history of cancer and concerns regarding a hereditary predisposition to cancer. Please refer to our prior cancer genetics clinic note for more information regarding our discussion, assessment and recommendations, at the time. [name] recent genetic test results were disclosed to her, as were recommendations warranted by these results. These results and recommendations are discussed in more detail below.  CANCER HISTORY:  Oncology History   No history exists.    FAMILY HISTORY:  We obtained a detailed, 4-generation family history.  Significant diagnoses are listed below: Family History  Problem Relation Age of Onset   Breast cancer Mother 64       bilateral   Diabetes Father    Heart attack Father    Coronary artery disease Father    Ovarian cancer Sister 45   Bladder Cancer Sister 76   Heart disease Brother    Hypertension Brother    Cancer Maternal Aunt        Eye   Esophageal cancer Maternal Aunt    Polycythemia Maternal Uncle    Lung cancer Paternal Aunt 34   Pneumonia Maternal Grandmother    Congestive Heart Failure Paternal Grandfather    Cancer Cousin 38       unknown       The patient has three children who are cancer free.  She has a brother and three sisters.  Her sister had both bladder and ovarian cancer at 53.  Both parents are deceased.   The patient's mother had bilateral breast cancer at 56.  She had a brother and four sisters.  One sister had eye cancer and one sister had esophageal cancer.   The patient's father had one sister who had lung cancer.  Her son had an unknown cancer at 40.     [name] is unaware of previous family history of genetic testing for hereditary cancer risks. There is no reported Ashkenazi Jewish ancestry. There is no known consanguinity  GENETIC TEST RESULTS: Genetic testing reported out on May 25, 2023 through the CancerNext-Expanded+RNA  cancer panel found no pathogenic mutations. The CancerNext-Expanded gene panel offered by [facility] and includes sequencing, rearrangement, and RNA analysis for the following 77 genes: AIP, ALK, APC, ATM, BAP1, BARD1, BMPR1A, BRCA1, BRCA2, BRIP1, CDC73, CDH1, CDK4, CDKN1B, CDKN2A, CEBPA, CHEK2, CTNNA1, DDX41, DICER1, ETV6, FH, FLCN, GATA2, LZTR1, MAX, MBD4, MEN1, MET, MLH1, MSH2, MSH3, MSH6, MUTYH, NF1, NF2, NTHL1, PALB2, PHOX2B, PMS2, POT1, PRKAR1A, PTCH1, PTEN, RAD51C, RAD51D, RB1, RET, RPS20, RUNX1, SDHA, SDHAF2, SDHB, SDHC, SDHD, SMAD4, SMARCA4, SMARCB1, SMARCE1, STK11, SUFU, TMEM127, TP53, TSC1, TSC2, VHL, and WT1 (sequencing and deletion/duplication); AXIN2, CTNNA1, DDX41, EGFR, HOXB13, KIT, MBD4, MITF, MSH3, PDGFRA, POLD1 and POLE (sequencing only); EPCAM and GREM1 (deletion/duplication only). RNA data is routinely analyzed for use in variant interpretation for all genes. The test report has been scanned into EPIC and is located under the Molecular Pathology section of the Results Review tab.  A portion of the result report is included below for reference.     We discussed with [name] that because current genetic testing is not perfect, it is possible there may be a gene mutation in one of these genes that current testing cannot detect, but that chance is small.  We also discussed, that there could be another gene that has not yet been discovered, or that we have not yet tested, that is responsible for the cancer diagnoses  in the family. It is also possible there is a hereditary cause for the cancer in the family that [name] did not inherit and therefore was not identified in her testing.  Therefore, it is important to remain in touch with cancer genetics in the future so that we can continue to offer [name] the most up to date genetic testing.   ADDITIONAL GENETIC TESTING: We discussed with [name] that her genetic testing was fairly extensive.  If there are genes identified to increase  cancer risk that can be analyzed in the future, we would be happy to discuss and coordinate this testing at that time.    CANCER SCREENING RECOMMENDATIONS: [name] test result is considered negative (normal).  This means that we have not identified a hereditary cause for her family history of cancer at this time. Most cancers happen by chance and this negative test suggests that her family history of cancer may fall into this category.    Possible reasons for [name]'s negative genetic test include:  1. There may be a gene mutation in one of these genes that current testing methods cannot detect but that chance is small.  2. There could be another gene that has not yet been discovered, or that we have not yet tested, that is responsible for the cancer diagnoses in the family.  3.  There may be no hereditary risk for cancer in the family. The cancers in [name] and/or her family may be sporadic/familial or due to other genetic and environmental factors. 4. It is also possible there is a hereditary cause for the cancer in the family that [name] did not inherit.  Therefore, it is recommended she continue to follow the cancer management and screening guidelines provided by her primary healthcare provider. An individual's cancer risk and medical management are not determined by genetic test results alone. Overall cancer risk assessment incorporates additional factors, including personal medical history, family history, and any available genetic information that may result in a personalized plan for cancer prevention and surveillance  RECOMMENDATIONS FOR FAMILY MEMBERS:   Since she did not inherit a identifiable mutation in a cancer predisposition gene included on this panel, her children could not have inherited a known mutation from her in one of these genes. Individuals in this family might be at some increased risk of developing cancer, over the general population risk, simply due to the  family history of cancer.  We recommended women in this family have a yearly mammogram beginning at age 11, or 4 years younger than the earliest onset of cancer, an annual clinical breast exam, and perform monthly breast self-exams. Women in this family should also have a gynecological exam as recommended by their primary provider. All family members should be referred for colonoscopy starting at age 36, or 33 years younger than the earliest onset of cancer.  FOLLOW-UP: Lastly, we discussed with Ms. Weinfeld that cancer genetics is a rapidly advancing field and it is possible that new genetic tests will be appropriate for her and/or her family members in the future. We encouraged her to remain in contact with cancer genetics on an annual basis so we can update her personal and family histories and let her know of advances in cancer genetics that may benefit this family.   Our contact number was provided. Ms. Storey questions were answered to her satisfaction, and she knows she is welcome to call us  at anytime with additional questions or concerns.   Mariah Shines  [place], MS, [facility] Licensed, Patent attorney [email or web address]

## 2023-05-30 ENCOUNTER — Inpatient Hospital Stay

## 2023-05-30 LAB — CBC WITH DIFFERENTIAL (CANCER CENTER ONLY)
Abs Immature Granulocytes: 0.06 10*3/uL (ref 0.00–0.07)
Basophils Absolute: 0 10*3/uL (ref 0.0–0.1)
Basophils Relative: 0 %
Eosinophils Absolute: 0 10*3/uL (ref 0.0–0.5)
Eosinophils Relative: 0 %
HCT: 39.7 % (ref 36.0–46.0)
Hemoglobin: 13.6 g/dL (ref 12.0–15.0)
Immature Granulocytes: 1 %
Lymphocytes Relative: 5 %
Lymphs Abs: 0.5 10*3/uL — ABNORMAL LOW (ref 0.7–4.0)
MCH: 33.5 pg (ref 26.0–34.0)
MCHC: 34.3 g/dL (ref 30.0–36.0)
MCV: 97.8 fL (ref 80.0–100.0)
Monocytes Absolute: 0.1 10*3/uL (ref 0.1–1.0)
Monocytes Relative: 1 %
Neutro Abs: 9.5 10*3/uL — ABNORMAL HIGH (ref 1.7–7.7)
Neutrophils Relative %: 93 %
Platelet Count: 261 10*3/uL (ref 150–400)
RBC: 4.06 MIL/uL (ref 3.87–5.11)
RDW: 11.9 % (ref 11.5–15.5)
WBC Count: 10.3 10*3/uL (ref 4.0–10.5)
nRBC: 0 % (ref 0.0–0.2)

## 2023-05-30 LAB — IRON AND TIBC
Iron: 77 ug/dL (ref 28–170)
Saturation Ratios: 22 % (ref 10.4–31.8)
TIBC: 350 ug/dL (ref 250–450)
UIBC: 273 ug/dL

## 2023-05-30 LAB — CMP (CANCER CENTER ONLY)
ALT: 21 U/L (ref 0–44)
AST: 24 U/L (ref 15–41)
Albumin: 4.8 g/dL (ref 3.5–5.0)
Alkaline Phosphatase: 94 U/L (ref 38–126)
Anion gap: 15 (ref 5–15)
BUN: 29 mg/dL — ABNORMAL HIGH (ref 8–23)
CO2: 23 mmol/L (ref 22–32)
Calcium: 10.5 mg/dL — ABNORMAL HIGH (ref 8.9–10.3)
Chloride: 101 mmol/L (ref 98–111)
Creatinine: 0.77 mg/dL (ref 0.44–1.00)
GFR, Estimated: >60 mL/min (ref >60)
Glucose, Bld: 147 mg/dL — ABNORMAL HIGH (ref 70–99)
Potassium: 4.2 mmol/L (ref 3.5–5.1)
Sodium: 138 mmol/L (ref 135–145)
Total Bilirubin: 0.3 mg/dL (ref 0.0–1.2)
Total Protein: 7.4 g/dL (ref 6.5–8.1)

## 2023-05-30 LAB — FERRITIN: Ferritin: 111 ng/mL (ref 11–307)

## 2023-05-30 NOTE — Progress Notes (Unsigned)
 [facility]  [address] [place],  [place]  [address] [phone]  Clinic Day:  05/31/2023  Referring physician: Sinda Duel, PA   HISTORY OF PRESENT ILLNESS:  The patient is a 73 y.o. 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  She comes in today to reassess her iron parameters after receiving another set of weekly phlebotomies.  Overall, she claims to be doing okay.  She complains of increased lightheadedness today.  She is concerned as to whether her phlebotomies are leading to a decline in her blood pressure to where her amlodipine may no longer be necessary.  She also claims to have occasional bruising over her body which dissipates shortly thereafter.  As it pertains to her hemochromatosis, she denies having any systemic changes which concern her for disease related complications.  PHYSICAL EXAM:  Blood pressure 121/74, pulse 93, temperature 99.8 F (37.7 C), temperature source Oral, resp. rate 14, height 5' 7.5" (1.715 m), weight 155 lb 11.2 oz (70.6 kg), SpO2 98%. Wt Readings from Last 3 Encounters:  05/31/23 155 lb 11.2 oz (70.6 kg)  05/02/23 157 lb (71.2 kg)  05/03/23 158 lb 9.6 oz (71.9 kg)   Body mass index is 24.03 kg/m. Performance status (ECOG): 0 - Asymptomatic Physical Exam Constitutional:      Appearance: Normal appearance. She is not ill-appearing.  HENT:     Mouth/Throat:     Mouth: Mucous membranes are moist.     Pharynx: Oropharynx is clear. No oropharyngeal exudate or posterior oropharyngeal erythema.  Cardiovascular:     Rate and Rhythm: Normal rate and regular rhythm.     Heart sounds: No murmur heard.    No friction rub. No gallop.  Pulmonary:     Effort: Pulmonary effort is normal. No respiratory distress.     Breath sounds: Normal breath sounds. No wheezing, rhonchi or rales.  Abdominal:     General: Bowel sounds are normal. There is no distension.     Palpations: Abdomen is soft. There is no mass.      Tenderness: There is no abdominal tenderness.  Musculoskeletal:        General: No swelling.     Right lower leg: No edema.     Left lower leg: No edema.  Lymphadenopathy:     Cervical: No cervical adenopathy.     Upper Body:     Right upper body: No supraclavicular or axillary adenopathy.     Left upper body: No supraclavicular or axillary adenopathy.     Lower Body: No right inguinal adenopathy. No left inguinal adenopathy.  Skin:    General: Skin is warm.     Coloration: Skin is not jaundiced.     Findings: No lesion or rash.  Neurological:     General: No focal deficit present.     Mental Status: She is alert and oriented to person, place, and time. Mental status is at baseline.  Psychiatric:        Mood and Affect: Mood normal.        Behavior: Behavior normal.        Thought Content: Thought content normal.    LABS:      Latest Ref Rng & Units 05/30/2023    9:06 AM 05/01/2023    9:05 AM 04/02/2023    8:46 AM  CBC  WBC 4.0 - 10.5 K/uL 10.3  5.5  6.7   Hemoglobin 12.0 - 15.0 g/dL 78.4  69.6  29.5   Hematocrit 36.0 -  46.0 % 39.7  38.8  39.0   Platelets 150 - 400 K/uL 261  224  233       Latest Ref Rng & Units 05/30/2023    9:06 AM 05/01/2023    9:05 AM 04/02/2023    8:46 AM  CMP  Glucose 70 - 99 mg/dL 865  96  98   BUN 8 - 23 mg/dL 29  18  27    Creatinine 0.44 - 1.00 mg/dL 7.84  6.96  2.95   Sodium 135 - 145 mmol/L 138  141  140   Potassium 3.5 - 5.1 mmol/L 4.2  4.1  4.1   Chloride 98 - 111 mmol/L 101  104  104   CO2 22 - 32 mmol/L 23  27  27    Calcium 8.9 - 10.3 mg/dL 28.4  13.2  9.6   Total Protein 6.5 - 8.1 g/dL 7.4  7.0  6.6   Total Bilirubin 0.0 - 1.2 mg/dL 0.3  <4.4  0.2   Alkaline Phos 38 - 126 U/L 94  94  95   AST 15 - 41 U/L 24  30  24    ALT 0 - 44 U/L 21  21  18     HEMOCHROMATOSIS TESTING: c.845G>A (p.Cys282Tyr) - Detected, homozygous  c.187C>G (p.His63Asp) - Not Detected  c.193A>T (p.Ser65Cys) - Not Detected  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    Latest Reference Range & Units 05/01/23 09:05 05/30/23 09:06  Iron 28 - 170 ug/dL 010 77  UIBC ug/dL 272 536  TIBC 644 - 034 ug/dL 742 595  Saturation Ratios 10.4 - 31.8 % 40 (H) 22  Ferritin 11 - 307 ng/mL 231 111  (H): Data is abnormally high  ASSESSMENT & PLAN:  Assessment/Plan:  A 73 y.o. 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  I am pleased as her iron parameters continue to fall with her weekly phlebotomies.  However, her ferritin has yet to get below 50.  However, as her iron parameters are better, I will begin spacing her phlebotomies out to once every 2 weeks.  A phlebotomy will be held today due to her lightheadedness and her having a systolic blood pressure under 638.  In fact, she will receive 1 L of IV fluids today to improve her baseline blood pressure.  Her first of biweekly phlebotomies will be given next Thursday, June 5th.  I will see this patient back in late July 2025 to reassess her iron parameters after receiving her upcoming biweekly phlebotomies.  The patient understands all the plans discussed today and is in agreement with them.    Aquarius Tremper Felicia Horde, MD

## 2023-05-31 ENCOUNTER — Inpatient Hospital Stay

## 2023-05-31 ENCOUNTER — Inpatient Hospital Stay: Admitting: Oncology

## 2023-05-31 MED ORDER — SODIUM CHLORIDE 0.9 % IV SOLN: Freq: Once | INTRAVENOUS | Status: AC

## 2023-06-01 ENCOUNTER — Ambulatory Visit: Attending: Cardiology | Admitting: Cardiology

## 2023-06-01 ENCOUNTER — Encounter: Payer: Self-pay | Admitting: Cardiology

## 2023-06-01 ENCOUNTER — Ambulatory Visit: Attending: Cardiology

## 2023-06-01 VITALS — BP 120/62 | HR 76 | Ht 67.6 in | Wt 154.0 lb

## 2023-06-01 DIAGNOSIS — R0602 Shortness of breath: Secondary | ICD-10-CM | POA: Diagnosis not present

## 2023-06-01 DIAGNOSIS — I10 Essential (primary) hypertension: Secondary | ICD-10-CM | POA: Diagnosis not present

## 2023-06-01 DIAGNOSIS — I7300 Raynaud's syndrome without gangrene: Secondary | ICD-10-CM | POA: Diagnosis not present

## 2023-06-01 DIAGNOSIS — R0609 Other forms of dyspnea: Secondary | ICD-10-CM | POA: Diagnosis not present

## 2023-06-01 DIAGNOSIS — R600 Localized edema: Secondary | ICD-10-CM | POA: Diagnosis not present

## 2023-06-01 NOTE — Addendum Note (Signed)
 Addended by: Shawnee Dellen D on: 06/01/2023 10:55 AM   Modules accepted: Orders

## 2023-06-01 NOTE — Progress Notes (Signed)
 Cardiology Consultation:    Date:  06/01/2023   ID:  Yolanda Vargas, DOB 05-25-1950, MRN [id number]  PCP:  Yolanda Hood, MD  Cardiologist:  Ralene Burger, MD   Referring MD: Yolanda Hood, MD   No chief complaint on file.   History of Present Illness:    Yolanda Vargas is a 73 y.o. female who is being seen today for the evaluation of dyspnea on exertion at the request of Yolanda Hood, MD. she was recently diagnosed with hemochromatosis, she did receive phlebotomies on the regular basis.  She was referred to us  to rule out potential cardiac involvement of hemochromatosis.  She described to have some shortness of breath which gradually getting worse.  She described the situation that she was working in [employer] and she had to slow down because of shortness of breath.  She also noted some swelling of lower extremities at the evening time.  Denies having a palpitation no chest pain tightness squeezing pressure burning chest.  She is very health-conscious and she would like to maintain her health the best possible way.  She never smoked she is originally from [place] , she does have multiple family members with premature coronary disease but likely her calcium score is 0 checked recently.  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   Surgery had to be postponed     Depression    Dyspepsia 03/25/1997   One of my most prominent health issues, diagnosed at this time     Excessive vitamin B12 intake 06/02/2021   Lab reports show excessive Vitamin B12     Family history of bladder cancer    Family history of breast cancer    Family history of heart disease    Family history of ovarian  cancer    Fatty (change of) liver, not elsewhere classified 08/30/2015   Female bladder prolapse    Gastritis 03/22/2023   First detected, during EGD     Gastroesophageal reflux disease without esophagitis 12/04/2016   Generalized anxiety disorder 03/05/2017   GERD (gastroesophageal reflux disease)    Hemochromatosis 01/08/2023   Hereditary hemochromatosis (HCC)    Hypertension    Hypothyroidism    Insulin resistance    Kidney stone 07/20/2022   Punctuate in size, right kidney     Left groin pain    Lyme disease    around 2009   Moderate persistent asthma 08/31/2015   Yolanda Vargas     NAFL (nonalcoholic fatty liver)    Osteopenia    Peripheral edema    Raynaud's phenomenon 12/30/2008   Raynaud's phenomenon without gangrene    Recurrent UTI 08/17/2022   Right groin pain    Seasonal allergic rhinitis 02/24/2014   Shortness of breath    Thyroid  goiter    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   amLODipine (NORVASC) 5 MG tablet Take 5 mg by mouth daily.   budesonide -formoterol  (SYMBICORT ) 160-4.5 MCG/ACT inhaler Inhale 2 puffs into the lungs 2 (two) times daily.   fluticasone  (FLONASE ) 50 MCG/ACT nasal spray Place 1 spray into both nostrils daily.   levocetirizine (XYZAL) 5 MG tablet Take 5 mg by mouth daily.   levothyroxine (SYNTHROID) 75 MCG tablet Take 75 mcg by mouth daily before breakfast.   omeprazole (PRILOSEC) 40 MG capsule Take 40 mg by mouth in the morning and at bedtime.    rosuvastatin (CRESTOR) 10 MG tablet Take 10 mg by mouth at bedtime.   sertraline (ZOLOFT) 50 MG tablet Take 75 mg by mouth daily.     Allergies:   Diltiazem hcl, Penicillins, Doxycycline, and Hydrocodone   Social History   Socioeconomic History   Marital status: Divorced    Spouse name: Not on file   Number of children: 3   Years of education: 12+6+60 hours   Highest education level: Master's degree (e.g., MA, MS, MEng, MEd, MSW, MBA)  Occupational History    Comment: RETIRED  Tobacco Use   Smoking status: Never   Smokeless tobacco: Never  Vaping Use   Vaping status: Never Used  Substance and Sexual Activity   Alcohol use: Not Currently   Drug use: Never   Sexual activity: Not Currently    Partners: Male    Birth control/protection: Surgical    Comment: first sexual encounter at 44, less than 5, hysterectomy  Other Topics Concern   Not on file  Social History Narrative   Not on file   Social Drivers of Health   Financial Resource Strain: Not on file  Food Insecurity: No Food Insecurity (12/20/2022)   Hunger Vital Sign    Worried About Running Out of Food in the Last Year: Never true    Ran Out of Food in the Last Year: Never true  Transportation Needs: No Transportation Needs (12/20/2022)   PRAPARE - Administrator, Civil Service (Medical): No    Lack of Transportation (Non-Medical): No  Physical Activity: Not on file  Stress: Not on file  Social Connections: Unknown (05/17/2021)   Received from [facility]   Social Network    Social Network: Not on file     Family History: The patient's family history includes Bladder Cancer (age of onset: 79) in her sister; Breast cancer (age of onset: 14) in her mother; Cancer in her maternal aunt; Cancer (age of onset: 57) in her cousin; Congestive Heart Failure in her paternal grandfather; Coronary artery disease in her father; Diabetes in her father; Esophageal cancer in her maternal aunt; Heart  attack in her father; Heart disease in her brother; Hypertension in her brother; Lung cancer (age of onset: 79) in her paternal aunt; Ovarian cancer (age of onset: 31) in her sister; Pneumonia in her maternal grandmother; Polycythemia in her maternal uncle. ROS:   Please see the history of present illness.    All 14 point review of systems negative except as described per history of present illness.  EKGs/Labs/Other Studies Reviewed:    The following studies were reviewed today:   EKG:  EKG Interpretation Date/Time:  Friday Jun 01 2023  09:55:07 EDT Ventricular Rate:  76 PR Interval:  168 QRS Duration:  84 QT Interval:  366 QTC Calculation: 411 R Axis:   50  Text Interpretation: Normal sinus rhythm Normal ECG No previous ECGs available Confirmed by Ralene Burger [phone]) on 06/01/2023 10:01:41 AM    Recent Labs: 05/30/2023: ALT 21; BUN 29; Creatinine 0.77; Hemoglobin 13.6; Platelet Count 261; Potassium 4.2; Sodium 138  Recent Lipid Panel No results found for: "CHOL", "TRIG", "HDL", "CHOLHDL", "VLDL", "LDLCALC", "LDLDIRECT"  Physical Exam:    VS:  BP 120/62   Pulse 76   Ht 5' 7.6" (1.717 m)   Wt 154 lb (69.9 kg)   SpO2 95%   BMI 23.69 kg/m     Wt Readings from Last 3 Encounters:  06/01/23 154 lb (69.9 kg)  05/31/23 155 lb 11.2 oz (70.6 kg)  05/02/23 157 lb (71.2 kg)     GEN:  Well nourished, well developed in no acute distress HEENT: Normal NECK: No JVD; No carotid bruits LYMPHATICS: No lymphadenopathy CARDIAC: RRR, no murmurs, no rubs, no gallops RESPIRATORY:  Clear to auscultation without rales, wheezing or rhonchi  ABDOMEN: Soft, non-tender, non-distended MUSCULOSKELETAL:  No edema; No deformity  SKIN: Warm and dry NEUROLOGIC:  Alert and oriented x 3 PSYCHIATRIC:  Normal affect   ASSESSMENT:    1. Shortness of breath   2. Hereditary hemochromatosis (HCC)   3. Benign hypertension   4. Dyspnea on exertion   5. Peripheral edema   6. Raynaud's phenomenon  without gangrene    PLAN:    In order of problems listed above:  Shortness of breath in this lady with history of hemochromatosis, obviously we need to rule out potential cardiac involvement.  Will schedule her to have echocardiogram in the future she may require MRI, she will be also scheduled to have Zio patch to make sure she does not have any significant arrhythmia even though she does not have any palpitations.  She does have history of carpal tunnel syndrome and there is concern about potentially amyloidosis again echocardiogram will be to start with. Benign essential hypertension worrisome factor is the issue of her blood pressure previously being high and now we required cut down some of the medication obviously hemochromatosis, amyloidosis need to be investigated. Peripheral edema none on the physical exam today, continue monitoring. We note phenomenon will continue monitoring    Medication Adjustments/Labs and Tests Ordered: Current medicines are reviewed at length with the patient today.  Concerns regarding medicines are outlined above.  Orders Placed This Encounter  Procedures   EKG 12-Lead   No orders of the defined types were placed in this encounter.   Signed, Manfred Seed, MD, [facility]. 06/01/2023 10:25 AM    [facility]

## 2023-06-01 NOTE — Patient Instructions (Signed)
 Medication Instructions:  Your physician recommends that you continue on your current medications as directed. Please refer to the Current Medication list given to you today.  *If you need a refill on your cardiac medications before your next appointment, please call your pharmacy*   Lab Work: None Ordered If you have labs (blood work) drawn today and your tests are completely normal, you will receive your results only by: MyChart Message (if you have MyChart) OR A paper copy in the mail If you have any lab test that is abnormal or we need to change your treatment, we will call you to review the results.   Testing/Procedures: Your physician has requested that you have an echocardiogram. Echocardiography is a painless test that uses sound waves to create images of your heart. It provides your doctor with information about the size and shape of your heart and how well your heart's chambers and valves are working. This procedure takes approximately one hour. There are no restrictions for this procedure. Please do NOT wear cologne, perfume, aftershave, or lotions (deodorant is allowed). Please arrive 15 minutes prior to your appointment time.  Please note: We ask at that you not bring children with you during ultrasound (echo/ vascular) testing. Due to room size and safety concerns, children are not allowed in the ultrasound rooms during exams. Our front office staff cannot provide observation of children in our lobby area while testing is being conducted. An adult accompanying a patient to their appointment will only be allowed in the ultrasound room at the discretion of the ultrasound technician under special circumstances. We apologize for any inconvenience.    WHY IS MY DOCTOR PRESCRIBING ZIO? The Zio system is proven and trusted by physicians to detect and diagnose irregular heart rhythms -- and has been prescribed to hundreds of thousands of patients.  The FDA has cleared the Zio system to  monitor for many different kinds of irregular heart rhythms. In a study, physicians were able to reach a diagnosis 90% of the time with the Zio system1.  You can wear the Zio monitor -- a small, discreet, comfortable patch -- during your normal day-to-day activity, including while you sleep, shower, and exercise, while it records every single heartbeat for analysis.  1Barrett, P., et al. Comparison of 24 Hour Holter Monitoring Versus 14 Day Novel Adhesive Patch Electrocardiographic Monitoring. American Journal of Medicine, 2014.  ZIO VS. HOLTER MONITORING The Zio monitor can be comfortably worn for up to 14 days. Holter monitors can be worn for 24 to 48 hours, limiting the time to record any irregular heart rhythms you may have. Zio is able to capture data for the 51% of patients who have their first symptom-triggered arrhythmia after 48 hours.1  LIVE WITHOUT RESTRICTIONS The Zio ambulatory cardiac monitor is a small, unobtrusive, and water-resistant patch--you might even forget you're wearing it. The Zio monitor records and stores every beat of your heart, whether you're sleeping, working out, or showering.     Follow-Up: At [facility], you and your health needs are our priority.  As part of our continuing mission to provide you with exceptional heart care, we have created designated Provider Care Teams.  These Care Teams include your primary Cardiologist (physician) and Advanced Practice Providers (APPs -  Physician Assistants and Nurse Practitioners) who all work together to provide you with the care you need, when you need it.  We recommend signing up for the patient portal called "MyChart".  Sign up information is provided on this  After Visit Summary.  MyChart is used to connect with patients for Virtual Visits (Telemedicine).  Patients are able to view lab/test results, encounter notes, upcoming appointments, etc.  Non-urgent messages can be sent to your provider as well.   To learn more  about what you can do with MyChart, go to [email or web address].    Your next appointment:   2 month(s)  The format for your next appointment:   In Person  Provider:   Gypsy Balsam, MD    Other Instructions NA

## 2023-06-02 DIAGNOSIS — Z79899 Other long term (current) drug therapy: Secondary | ICD-10-CM | POA: Diagnosis not present

## 2023-06-02 DIAGNOSIS — R079 Chest pain, unspecified: Secondary | ICD-10-CM | POA: Diagnosis not present

## 2023-06-02 DIAGNOSIS — J45909 Unspecified asthma, uncomplicated: Secondary | ICD-10-CM | POA: Diagnosis not present

## 2023-06-02 DIAGNOSIS — R5383 Other fatigue: Secondary | ICD-10-CM | POA: Diagnosis not present

## 2023-06-03 DIAGNOSIS — R300 Dysuria: Secondary | ICD-10-CM | POA: Diagnosis not present

## 2023-06-04 DIAGNOSIS — K5900 Constipation, unspecified: Secondary | ICD-10-CM | POA: Diagnosis not present

## 2023-06-04 DIAGNOSIS — R140 Abdominal distension (gaseous): Secondary | ICD-10-CM | POA: Diagnosis not present

## 2023-06-04 DIAGNOSIS — Z860100 Personal history of colon polyps, unspecified: Secondary | ICD-10-CM | POA: Diagnosis not present

## 2023-06-04 DIAGNOSIS — R110 Nausea: Secondary | ICD-10-CM | POA: Diagnosis not present

## 2023-06-04 DIAGNOSIS — K317 Polyp of stomach and duodenum: Secondary | ICD-10-CM | POA: Diagnosis not present

## 2023-06-07 ENCOUNTER — Inpatient Hospital Stay

## 2023-06-07 DIAGNOSIS — N390 Urinary tract infection, site not specified: Secondary | ICD-10-CM | POA: Diagnosis not present

## 2023-06-07 DIAGNOSIS — R42 Dizziness and giddiness: Secondary | ICD-10-CM | POA: Diagnosis not present

## 2023-06-07 DIAGNOSIS — I959 Hypotension, unspecified: Secondary | ICD-10-CM | POA: Diagnosis not present

## 2023-06-11 ENCOUNTER — Telehealth: Payer: Self-pay

## 2023-06-11 NOTE — Telephone Encounter (Signed)
 Pt c/o still feeling fatigued, light headed, and states her PCP lowered her BP med. She went to Urgent Care, they checked her urine and labs. She said they told her she didn't need IVF.  She is drinking liquid IV.

## 2023-06-12 ENCOUNTER — Inpatient Hospital Stay

## 2023-06-13 ENCOUNTER — Telehealth: Payer: Self-pay | Admitting: Cardiology

## 2023-06-13 NOTE — Telephone Encounter (Signed)
 Advised to see PCP for concerns as pt is not on blood thinners. Pt has an appt in the am with PCP. Pt verbalized understanding and had no additional questions.

## 2023-06-13 NOTE — Telephone Encounter (Signed)
 Pt is have what sounds like bruising all over her body.

## 2023-06-14 DIAGNOSIS — J3089 Other allergic rhinitis: Secondary | ICD-10-CM | POA: Diagnosis not present

## 2023-06-14 DIAGNOSIS — R42 Dizziness and giddiness: Secondary | ICD-10-CM | POA: Diagnosis not present

## 2023-06-14 DIAGNOSIS — I10 Essential (primary) hypertension: Secondary | ICD-10-CM | POA: Diagnosis not present

## 2023-06-14 DIAGNOSIS — J3081 Allergic rhinitis due to animal (cat) (dog) hair and dander: Secondary | ICD-10-CM | POA: Diagnosis not present

## 2023-06-14 DIAGNOSIS — T148XXA Other injury of unspecified body region, initial encounter: Secondary | ICD-10-CM | POA: Diagnosis not present

## 2023-06-14 DIAGNOSIS — R040 Epistaxis: Secondary | ICD-10-CM | POA: Diagnosis not present

## 2023-06-14 DIAGNOSIS — J301 Allergic rhinitis due to pollen: Secondary | ICD-10-CM | POA: Diagnosis not present

## 2023-06-20 DIAGNOSIS — R600 Localized edema: Secondary | ICD-10-CM | POA: Diagnosis not present

## 2023-06-20 DIAGNOSIS — R0602 Shortness of breath: Secondary | ICD-10-CM | POA: Diagnosis not present

## 2023-06-22 ENCOUNTER — Inpatient Hospital Stay: Admitting: Hematology & Oncology

## 2023-06-22 ENCOUNTER — Inpatient Hospital Stay: Attending: Oncology

## 2023-06-22 DIAGNOSIS — J301 Allergic rhinitis due to pollen: Secondary | ICD-10-CM | POA: Diagnosis not present

## 2023-06-22 DIAGNOSIS — J3081 Allergic rhinitis due to animal (cat) (dog) hair and dander: Secondary | ICD-10-CM | POA: Diagnosis not present

## 2023-06-22 DIAGNOSIS — J3089 Other allergic rhinitis: Secondary | ICD-10-CM | POA: Diagnosis not present

## 2023-06-22 LAB — CMP (CANCER CENTER ONLY)
ALT: 24 U/L (ref 0–44)
AST: 23 U/L (ref 15–41)
Albumin: 4.5 g/dL (ref 3.5–5.0)
Alkaline Phosphatase: 66 U/L (ref 38–126)
Anion gap: 9 (ref 5–15)
BUN: 13 mg/dL (ref 8–23)
CO2: 29 mmol/L (ref 22–32)
Calcium: 9.9 mg/dL (ref 8.9–10.3)
Chloride: 103 mmol/L (ref 98–111)
Creatinine: 0.84 mg/dL (ref 0.44–1.00)
GFR, Estimated: >60 mL/min (ref >60)
Glucose, Bld: 117 mg/dL — ABNORMAL HIGH (ref 70–99)
Potassium: 4.6 mmol/L (ref 3.5–5.1)
Sodium: 141 mmol/L (ref 135–145)
Total Bilirubin: 0.4 mg/dL (ref 0.0–1.2)
Total Protein: 6.8 g/dL (ref 6.5–8.1)

## 2023-06-22 LAB — CBC WITH DIFFERENTIAL (CANCER CENTER ONLY)
Abs Immature Granulocytes: 0.02 10*3/uL (ref 0.00–0.07)
Basophils Absolute: 0 10*3/uL (ref 0.0–0.1)
Basophils Relative: 1 %
Eosinophils Absolute: 0.1 10*3/uL (ref 0.0–0.5)
Eosinophils Relative: 2 %
HCT: 40.9 % (ref 36.0–46.0)
Hemoglobin: 13.8 g/dL (ref 12.0–15.0)
Immature Granulocytes: 0 %
Lymphocytes Relative: 16 %
Lymphs Abs: 1.2 10*3/uL (ref 0.7–4.0)
MCH: 33.4 pg (ref 26.0–34.0)
MCHC: 33.7 g/dL (ref 30.0–36.0)
MCV: 99 fL (ref 80.0–100.0)
Monocytes Absolute: 0.5 10*3/uL (ref 0.1–1.0)
Monocytes Relative: 6 %
Neutro Abs: 5.6 10*3/uL (ref 1.7–7.7)
Neutrophils Relative %: 75 %
Platelet Count: 206 10*3/uL (ref 150–400)
RBC: 4.13 MIL/uL (ref 3.87–5.11)
RDW: 12.2 % (ref 11.5–15.5)
WBC Count: 7.4 10*3/uL (ref 4.0–10.5)
nRBC: 0 % (ref 0.0–0.2)

## 2023-06-22 LAB — FERRITIN: Ferritin: 271 ng/mL (ref 11–307)

## 2023-06-25 ENCOUNTER — Inpatient Hospital Stay

## 2023-06-25 ENCOUNTER — Encounter: Payer: Self-pay | Admitting: Hematology & Oncology

## 2023-06-25 LAB — IRON AND IRON BINDING CAPACITY (CC-WL,HP ONLY)
Iron: 119 ug/dL (ref 28–170)
Saturation Ratios: 38 % — ABNORMAL HIGH (ref 10.4–31.8)
TIBC: 312 ug/dL (ref 250–450)
UIBC: 193 ug/dL (ref 148–442)

## 2023-06-25 MED ORDER — SODIUM CHLORIDE 0.9 % IV SOLN: Freq: Once | INTRAVENOUS | Status: AC

## 2023-06-25 NOTE — Progress Notes (Signed)
 Yolanda Vargas presents today for phlebotomy per MD orders. Phlebotomy procedure started at 1600 and ended at 1630 500 grams removed. Patient declined to be observed for 30 minutes after procedure  Patient tolerated procedure well. IV needle removed intact out of left AC.

## 2023-06-25 NOTE — Patient Instructions (Signed)

## 2023-06-26 ENCOUNTER — Other Ambulatory Visit: Payer: Self-pay | Admitting: Oncology

## 2023-06-26 ENCOUNTER — Encounter: Payer: Self-pay | Admitting: Hematology & Oncology

## 2023-06-27 DIAGNOSIS — G5603 Carpal tunnel syndrome, bilateral upper limbs: Secondary | ICD-10-CM | POA: Diagnosis not present

## 2023-06-27 DIAGNOSIS — J3089 Other allergic rhinitis: Secondary | ICD-10-CM | POA: Diagnosis not present

## 2023-06-27 DIAGNOSIS — R748 Abnormal levels of other serum enzymes: Secondary | ICD-10-CM | POA: Diagnosis not present

## 2023-06-27 DIAGNOSIS — R040 Epistaxis: Secondary | ICD-10-CM | POA: Diagnosis not present

## 2023-06-27 DIAGNOSIS — J3081 Allergic rhinitis due to animal (cat) (dog) hair and dander: Secondary | ICD-10-CM | POA: Diagnosis not present

## 2023-06-27 DIAGNOSIS — M2550 Pain in unspecified joint: Secondary | ICD-10-CM | POA: Diagnosis not present

## 2023-06-27 DIAGNOSIS — J301 Allergic rhinitis due to pollen: Secondary | ICD-10-CM | POA: Diagnosis not present

## 2023-06-28 ENCOUNTER — Other Ambulatory Visit: Payer: Self-pay

## 2023-06-28 ENCOUNTER — Telehealth: Payer: Self-pay

## 2023-06-28 DIAGNOSIS — M25531 Pain in right wrist: Secondary | ICD-10-CM

## 2023-06-28 NOTE — Telephone Encounter (Signed)
 EMG ordered

## 2023-06-28 NOTE — Telephone Encounter (Signed)
 I called pt to r/s for Monday. She still hasn't had nerve study yet. I will cancel her for now until she has that done. Is it ok to do bilateral upper extremity EMG? It was originally just ordered for the right

## 2023-06-28 NOTE — Telephone Encounter (Signed)
 Patient called regarding her transfer of care and phlebotomies, MD note not finished. Discussed with MD, he will finish note today with LOS for scheduling, pt okay to go ahead and schedule phlebotomy for next Monday with labs. She was informed to go ahead and cancel appts with [facility] since she has transferred care to our office. Pt verbalized understanding and transferred to scheduling dept.

## 2023-06-28 NOTE — Addendum Note (Signed)
 Addended by: VANDERBILT LIONEL CROME on: 06/28/2023 04:06 PM   Modules accepted: Orders

## 2023-06-29 ENCOUNTER — Encounter (HOSPITAL_COMMUNITY): Payer: Self-pay

## 2023-06-29 ENCOUNTER — Ambulatory Visit (HOSPITAL_COMMUNITY): Admission: RE | Admit: 2023-06-29 | Source: Ambulatory Visit

## 2023-07-02 ENCOUNTER — Telehealth: Payer: Self-pay | Admitting: *Deleted

## 2023-07-02 ENCOUNTER — Inpatient Hospital Stay

## 2023-07-02 ENCOUNTER — Ambulatory Visit: Admitting: Orthopedic Surgery

## 2023-07-02 ENCOUNTER — Other Ambulatory Visit: Payer: Self-pay | Admitting: *Deleted

## 2023-07-02 ENCOUNTER — Telehealth: Payer: Self-pay | Admitting: Oncology

## 2023-07-02 LAB — CBC WITH DIFFERENTIAL (CANCER CENTER ONLY)
Abs Immature Granulocytes: 0.02 10*3/uL (ref 0.00–0.07)
Basophils Absolute: 0.1 10*3/uL (ref 0.0–0.1)
Basophils Relative: 1 %
Eosinophils Absolute: 0.2 10*3/uL (ref 0.0–0.5)
Eosinophils Relative: 2 %
HCT: 40.3 % (ref 36.0–46.0)
Hemoglobin: 13.6 g/dL (ref 12.0–15.0)
Immature Granulocytes: 0 %
Lymphocytes Relative: 17 %
Lymphs Abs: 1.2 10*3/uL (ref 0.7–4.0)
MCH: 33.4 pg (ref 26.0–34.0)
MCHC: 33.7 g/dL (ref 30.0–36.0)
MCV: 99 fL (ref 80.0–100.0)
Monocytes Absolute: 0.5 10*3/uL (ref 0.1–1.0)
Monocytes Relative: 8 %
Neutro Abs: 4.8 10*3/uL (ref 1.7–7.7)
Neutrophils Relative %: 72 %
Platelet Count: 238 10*3/uL (ref 150–400)
RBC: 4.07 MIL/uL (ref 3.87–5.11)
RDW: 12.3 % (ref 11.5–15.5)
WBC Count: 6.7 10*3/uL (ref 4.0–10.5)
nRBC: 0 % (ref 0.0–0.2)

## 2023-07-02 LAB — COMPREHENSIVE METABOLIC PANEL WITH GFR
ALT: 17 U/L (ref 0–44)
AST: 19 U/L (ref 15–41)
Albumin: 4.4 g/dL (ref 3.5–5.0)
Alkaline Phosphatase: 67 U/L (ref 38–126)
Anion gap: 8 (ref 5–15)
BUN: 15 mg/dL (ref 8–23)
CO2: 29 mmol/L (ref 22–32)
Calcium: 9.7 mg/dL (ref 8.9–10.3)
Chloride: 100 mmol/L (ref 98–111)
Creatinine, Ser: 0.8 mg/dL (ref 0.44–1.00)
GFR, Estimated: >60 mL/min (ref >60)
Glucose, Bld: 100 mg/dL — ABNORMAL HIGH (ref 70–99)
Potassium: 4.9 mmol/L (ref 3.5–5.1)
Sodium: 137 mmol/L (ref 135–145)
Total Bilirubin: 0.4 mg/dL (ref 0.0–1.2)
Total Protein: 6.9 g/dL (ref 6.5–8.1)

## 2023-07-02 LAB — IRON AND IRON BINDING CAPACITY (CC-WL,HP ONLY)
Iron: 132 ug/dL (ref 28–170)
Saturation Ratios: 42 % — ABNORMAL HIGH (ref 10.4–31.8)
TIBC: 315 ug/dL (ref 250–450)
UIBC: 183 ug/dL (ref 148–442)

## 2023-07-02 LAB — FERRITIN: Ferritin: 278 ng/mL (ref 11–307)

## 2023-07-02 MED ORDER — SODIUM CHLORIDE 0.9 % IV SOLN: Freq: Once | INTRAVENOUS | Status: DC

## 2023-07-02 NOTE — Progress Notes (Signed)
 Yolanda Vargas presents today for phlebotomy per MD orders. Phlebotomy procedure started at 1348 and ended at 1405. 515 grams removed via 20 gauge needle to left AC. Patient observed for 30 minutes after procedure without any incident while receiving fluid. Patient tolerated procedure well. IV needle removed intact.

## 2023-07-02 NOTE — Progress Notes (Signed)
 Pt reports numbness and tingling in arms and legs. Reports easy bruising on skin. Redness of arm and torso. ..abdominal tenderness intermrnt times. Denis difficulty breathing  d during the above said times.     At 1500, Pt D/Ced home. Appoxi 500 mld of NS infused after 515 mls of blood removed. Steady gait noted upon D/C.

## 2023-07-02 NOTE — Patient Instructions (Signed)

## 2023-07-02 NOTE — Telephone Encounter (Signed)
 Secure chat received requesting all appts at [facility] be cancelled.  Pt transferred her care to [facility].

## 2023-07-02 NOTE — Telephone Encounter (Signed)
 Patient is transferring care from [facility] to here.  Reviewed parameters with dR Ennever.  Parameters for this patient are Phlebotomy is Saturation over 30 or Ferritin over 100.  Ok to do phlebotomy today based on labs done 6.20.25.

## 2023-07-03 ENCOUNTER — Ambulatory Visit: Attending: Cardiology

## 2023-07-03 DIAGNOSIS — R0609 Other forms of dyspnea: Secondary | ICD-10-CM

## 2023-07-03 NOTE — Progress Notes (Unsigned)
 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  HPI: [name] is a very charming 73 year old white female.  She actually has been seen down at the cancer center in [place].  She has known diagnosis of hemochromatosis.  She has been getting phlebotomies.  She was initially seen back in January 2025.  At that time, she was seen because she had elevated ferritin.  I think her ferritin was 444.  Her iron saturation was 61%.  She saw Dr. Ezzard.  He recommended that she undergo phlebotomies to get her ferritin down below 50.  She has been getting phlebotomies.  She did have some lightheadedness with all of the phlebotomies.  She had her last iron studies on 05/22/2023.  It showed a ferritin of 111 with an iron saturation of 22%.  Today, her ferritin was 271 with an iron saturation of 38%.  She clearly has a tendency to absorb and retain iron..  She is watching what she eats.  She is not taking any supplemental iron.  She is not taking any vitamin C.  She has had no problems with nausea or vomiting.  She has had some joint issues.  There is been no rashes.  Her last mammogram was done back in March and everything was fine.  She has not donated blood to the [employer].  This would certainly be a possibility for her.  She has had no visual changes.  She has had no headache.  I do not think she has had any problems with COVID.  She does not smoke.  She does not have any significant alcohol use.  Overall, I would say that her performance status is probably ECOG 1.    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   Surgery had to be postponed     Depression    Dyspepsia 03/25/1997   One of my most prominent health issues, diagnosed at this time     Excessive vitamin B12 intake 06/02/2021   Lab reports show excessive Vitamin B12     Family history of bladder cancer    Family history of breast cancer    Family history of heart disease    Family history of ovarian cancer    Fatty (change of) liver, not elsewhere classified 08/30/2015   Female bladder prolapse    Gastritis 03/22/2023   First detected, during EGD     Gastroesophageal reflux disease without esophagitis 12/04/2016   Generalized anxiety disorder 03/05/2017   GERD (gastroesophageal reflux disease)    Hemochromatosis 01/08/2023   Hereditary hemochromatosis (HCC)    Hypertension    Hypothyroidism    Insulin resistance    Kidney stone 07/20/2022   Punctuate in size, right kidney     Left groin pain    Lyme disease    around 2009   Moderate persistent asthma 08/31/2015   Francis     NAFL (nonalcoholic fatty liver)    Osteopenia    Peripheral edema    Raynaud's phenomenon 12/30/2008   Raynaud's phenomenon without gangrene  Recurrent UTI 08/17/2022   Right groin pain    Seasonal allergic rhinitis 02/24/2014   Shortness of breath    Thyroid  goiter    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     Vaginal prolapse   :   Past Surgical History:  Procedure Laterality Date   BREAST BIOPSY Left    2018   CERVICAL CONE BIOPSY     CHOLECYSTECTOMY     COLONOSCOPY     ESOPHAGOGASTRODUODENOSCOPY     KNEE SURGERY     Bursa removed   LAPAROSCOPIC HYSTERECTOMY  2020  :   Current Outpatient Medications:    albuterol  (VENTOLIN  HFA) 108 (90 Base) MCG/ACT inhaler, Inhale 2 puffs into the lungs every 6 (six) hours as needed for wheezing or shortness of breath., Disp: 1 each, Rfl: 11   amLODipine (NORVASC) 5 MG tablet, Take 5 mg by mouth daily., Disp: , Rfl:     budesonide -formoterol  (SYMBICORT ) 160-4.5 MCG/ACT inhaler, Inhale 2 puffs into the lungs 2 (two) times daily., Disp: 10.2 g, Rfl: 6   fluticasone  (FLONASE ) 50 MCG/ACT nasal spray, Place 1 spray into both nostrils daily., Disp: 16 g, Rfl: 1   levocetirizine (XYZAL) 5 MG tablet, Take 5 mg by mouth daily., Disp: , Rfl:    levothyroxine (SYNTHROID) 75 MCG tablet, Take 75 mcg by mouth daily before breakfast., Disp: , Rfl:    omeprazole (PRILOSEC) 40 MG capsule, Take 40 mg by mouth in the morning and at bedtime., Disp: , Rfl:    rosuvastatin (CRESTOR) 10 MG tablet, Take 10 mg by mouth at bedtime., Disp: , Rfl:    sertraline (ZOLOFT) 50 MG tablet, Take 75 mg by mouth daily., Disp: , Rfl:  No current facility-administered medications for this visit.  Facility-Administered Medications Ordered in Other Visits:    0.9 %  sodium chloride  infusion, , Intravenous, Once, Lewis, Dequincy A, MD:  :   Allergies  Allergen Reactions   Diltiazem Hcl Other (See Comments)    Stoke like symptoms/more like balance issues, lightheaded, feeling faint     Penicillins Rash   Doxycycline Rash   Hydrocodone Palpitations  :   Family History  Problem Relation Age of Onset   Breast cancer Mother 53       bilateral   Diabetes Father    Heart attack Father    Coronary artery disease Father    Ovarian cancer Sister 44   Bladder Cancer Sister 54   Heart disease Brother    Hypertension Brother    Cancer Maternal Aunt        Eye   Esophageal cancer Maternal Aunt    Polycythemia Maternal Uncle    Lung cancer Paternal Aunt 62   Pneumonia Maternal Grandmother    Congestive Heart Failure Paternal Grandfather    Cancer Cousin 57       unknown  :   Social History   Socioeconomic History   Marital status: Divorced    Spouse name: Not on file   Number of children: 3   Years of education: 12+6+60 hours   Highest education level: Master's degree (e.g., MA, MS, MEng, MEd, MSW, MBA)  Occupational History     Comment: RETIRED  Tobacco Use   Smoking status: Never   Smokeless tobacco: Never  Vaping Use   Vaping status: Never Used  Substance and Sexual Activity   Alcohol use: Not Currently   Drug use: Never   Sexual activity: Not Currently    Partners: Male  Birth control/protection: Surgical    Comment: first sexual encounter at 7, less than 5, hysterectomy  Other Topics Concern   Not on file  Social History Narrative   Not on file   Social Drivers of Health   Financial Resource Strain: Not on file  Food Insecurity: No Food Insecurity (06/22/2023)   Hunger Vital Sign    Worried About Running Out of Food in the Last Year: Never true    Ran Out of Food in the Last Year: Never true  Transportation Needs: No Transportation Needs (06/22/2023)   PRAPARE - Administrator, Civil Service (Medical): No    Lack of Transportation (Non-Medical): No  Physical Activity: Not on file  Stress: Not on file  Social Connections: Unknown (05/17/2021)   [facility]   Social Network    Social Network: Not on file  Intimate Partner Violence: Not At Risk (06/22/2023)   Humiliation, Afraid, Rape, and Kick questionnaire    Fear of Current or Ex-Partner: No    Emotionally Abused: No    Physically Abused: No    Sexually Abused: No  :  Review of Systems  Constitutional: Negative.   HENT: Negative.    Eyes: Negative.   Respiratory: Negative.    Cardiovascular: Negative.   Gastrointestinal: Negative.   Genitourinary: Negative.   Musculoskeletal: Negative.   Skin: Negative.   Neurological: Negative.   Endo/Heme/Allergies: Negative.   Psychiatric/Behavioral: Negative.       Exam:  Her vital signs show a temperature of 97.9.  Pulse 87.  Blood pressure 129/78.  Weight is 151 pounds.  @IPVITALS @  Physical Exam Vitals reviewed.  HENT:     Head: Normocephalic and atraumatic.   Eyes:     Pupils: Pupils are equal, round, and reactive to light.    Cardiovascular:      Rate and Rhythm: Normal rate and regular rhythm.     Heart sounds: Normal heart sounds.  Pulmonary:     Effort: Pulmonary effort is normal.     Breath sounds: Normal breath sounds.  Abdominal:     General: Bowel sounds are normal.     Palpations: Abdomen is soft.   Musculoskeletal:        General: No tenderness or deformity. Normal range of motion.     Cervical back: Normal range of motion.  Lymphadenopathy:     Cervical: No cervical adenopathy.   Skin:    General: Skin is warm and dry.     Findings: No erythema or rash.   Neurological:     Mental Status: She is alert and oriented to person, place, and time.   Psychiatric:        Behavior: Behavior normal.        Thought Content: Thought content normal.        Judgment: Judgment normal.     Recent Labs    07/02/23 1312  WBC 6.7  HGB 13.6  HCT 40.3  PLT 238    Recent Labs    07/02/23 1312  NA 137  K 4.9  CL 100  CO2 29  GLUCOSE 100*  BUN 15  CREATININE 0.80  CALCIUM 9.7    Blood smear review: None  Pathology: None    Assessment and Plan: Ms. Timmons is a very nice 73 year old white female.  She certainly looks a lot younger.  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  She definitely has a fairly active form of hemochromatosis.  She really  needs to have her phlebotomies.  I know that she has had some issues with her phlebotomies.  Another possibility would be to donate blood to the [employer].  This could certainly help her out and also help out the community.  Her blood is safe.  We had lab studies that were done on 07/02/2023.  This showed a ferritin of 278 with an iron saturation of 42%.  Some of the ferritin elevation could certainly be from inflammation.  I would probably utilize her iron saturation as a measure of her iron level.  I probably would like to try to keep her iron saturation less than 25%.  I think that if we do phlebotomize her, we probably have to give her some IV  fluids.  We probably have to get blood out of her slowly so that her body can equilibrate a little bit.  We will have to see about a phlebotomy schedule for her.  I probably would have to have her come back to see me in a couple of weeks or so.

## 2023-07-04 ENCOUNTER — Other Ambulatory Visit: Payer: Self-pay

## 2023-07-04 ENCOUNTER — Telehealth: Payer: Self-pay

## 2023-07-04 DIAGNOSIS — J301 Allergic rhinitis due to pollen: Secondary | ICD-10-CM | POA: Diagnosis not present

## 2023-07-04 DIAGNOSIS — J3089 Other allergic rhinitis: Secondary | ICD-10-CM | POA: Diagnosis not present

## 2023-07-04 DIAGNOSIS — J3081 Allergic rhinitis due to animal (cat) (dog) hair and dander: Secondary | ICD-10-CM | POA: Diagnosis not present

## 2023-07-04 LAB — ECHOCARDIOGRAM COMPLETE
AV Vena cont: 0.2 cm
Area-P 1/2: 3.29 cm2
S' Lateral: 2.7 cm

## 2023-07-04 NOTE — Progress Notes (Signed)
 Unable to do MRI at [facility], faxed order to [facility]

## 2023-07-04 NOTE — Telephone Encounter (Signed)
 Patient called stating she attempted to schedule her MRI at [facility], and they did not have the right equipment for it. Recommendation for [facility], order sent and new PA is being work on. Pt aware.  Patient also states she found 2 other patients charts in her chart from 2013. Reviewed under media tab, Building control surveyor informed.

## 2023-07-05 ENCOUNTER — Inpatient Hospital Stay: Attending: Oncology

## 2023-07-05 ENCOUNTER — Other Ambulatory Visit: Payer: Self-pay | Admitting: *Deleted

## 2023-07-09 ENCOUNTER — Telehealth: Payer: Self-pay | Admitting: Cardiology

## 2023-07-09 ENCOUNTER — Ambulatory Visit: Payer: Self-pay | Admitting: Cardiology

## 2023-07-09 ENCOUNTER — Other Ambulatory Visit: Payer: Self-pay | Admitting: *Deleted

## 2023-07-09 ENCOUNTER — Ambulatory Visit: Admitting: Orthopedic Surgery

## 2023-07-09 ENCOUNTER — Encounter: Payer: Self-pay | Admitting: Neurology

## 2023-07-09 ENCOUNTER — Telehealth: Payer: Self-pay | Admitting: *Deleted

## 2023-07-09 ENCOUNTER — Telehealth: Payer: Self-pay

## 2023-07-09 DIAGNOSIS — J3081 Allergic rhinitis due to animal (cat) (dog) hair and dander: Secondary | ICD-10-CM | POA: Diagnosis not present

## 2023-07-09 DIAGNOSIS — R0602 Shortness of breath: Secondary | ICD-10-CM

## 2023-07-09 DIAGNOSIS — J301 Allergic rhinitis due to pollen: Secondary | ICD-10-CM | POA: Diagnosis not present

## 2023-07-09 DIAGNOSIS — J3089 Other allergic rhinitis: Secondary | ICD-10-CM | POA: Diagnosis not present

## 2023-07-09 NOTE — Telephone Encounter (Signed)
 Pt c/o Shortness Of Breath: STAT if SOB developed within the last 24 hours or pt is noticeably SOB on the phone  1. Are you currently SOB (can you hear that pt is SOB on the phone)? No   2. How long have you been experiencing SOB? Few weeks   3. Are you SOB when sitting or when up moving around? Both but a lot when laying down at night   4. Are you currently experiencing any other symptoms? Cough

## 2023-07-09 NOTE — Telephone Encounter (Signed)
 MRI orders faxed to Radiology at [facility] per patient request.  Faxed to [phone].  Confirmed as received

## 2023-07-09 NOTE — Telephone Encounter (Signed)
 Pt reported continued shortness of breath. She wanted to know what her Echo and monitor results showed. Advised per Dr. Bernie that her Echo and monitor all looked good. He recommended BMP, ProBNP for SOB. Pt agreed and verbalized understanding.

## 2023-07-10 ENCOUNTER — Inpatient Hospital Stay

## 2023-07-10 ENCOUNTER — Encounter: Payer: Self-pay | Admitting: Orthopedic Surgery

## 2023-07-10 ENCOUNTER — Inpatient Hospital Stay: Attending: Oncology

## 2023-07-10 LAB — CBC WITH DIFFERENTIAL (CANCER CENTER ONLY)
Abs Immature Granulocytes: 0.03 K/uL (ref 0.00–0.07)
Basophils Absolute: 0 K/uL (ref 0.0–0.1)
Basophils Relative: 1 %
Eosinophils Absolute: 0.1 K/uL (ref 0.0–0.5)
Eosinophils Relative: 1 %
HCT: 38.5 % (ref 36.0–46.0)
Hemoglobin: 13 g/dL (ref 12.0–15.0)
Immature Granulocytes: 0 %
Lymphocytes Relative: 16 %
Lymphs Abs: 1.3 K/uL (ref 0.7–4.0)
MCH: 33.8 pg (ref 26.0–34.0)
MCHC: 33.8 g/dL (ref 30.0–36.0)
MCV: 100 fL (ref 80.0–100.0)
Monocytes Absolute: 0.5 K/uL (ref 0.1–1.0)
Monocytes Relative: 7 %
Neutro Abs: 6.2 K/uL (ref 1.7–7.7)
Neutrophils Relative %: 75 %
Platelet Count: 279 K/uL (ref 150–400)
RBC: 3.85 MIL/uL — ABNORMAL LOW (ref 3.87–5.11)
RDW: 12.8 % (ref 11.5–15.5)
WBC Count: 8.2 K/uL (ref 4.0–10.5)
nRBC: 0 % (ref 0.0–0.2)

## 2023-07-10 LAB — COMPREHENSIVE METABOLIC PANEL WITH GFR
ALT: 15 U/L (ref 0–44)
AST: 20 U/L (ref 15–41)
Albumin: 4.6 g/dL (ref 3.5–5.0)
Alkaline Phosphatase: 62 U/L (ref 38–126)
Anion gap: 10 (ref 5–15)
BUN: 17 mg/dL (ref 8–23)
CO2: 27 mmol/L (ref 22–32)
Calcium: 9.7 mg/dL (ref 8.9–10.3)
Chloride: 99 mmol/L (ref 98–111)
Creatinine, Ser: 0.77 mg/dL (ref 0.44–1.00)
GFR, Estimated: >60 mL/min (ref >60)
Glucose, Bld: 104 mg/dL — ABNORMAL HIGH (ref 70–99)
Potassium: 4 mmol/L (ref 3.5–5.1)
Sodium: 136 mmol/L (ref 135–145)
Total Bilirubin: 0.3 mg/dL (ref 0.0–1.2)
Total Protein: 7 g/dL (ref 6.5–8.1)

## 2023-07-10 LAB — FERRITIN: Ferritin: 196 ng/mL (ref 11–307)

## 2023-07-10 MED ORDER — SODIUM CHLORIDE 0.9 % IV SOLN: Freq: Once | INTRAVENOUS | Status: AC

## 2023-07-10 NOTE — Progress Notes (Signed)
 Yolanda Vargas presents today for phlebotomy per MD orders. Phlebotomy procedure started at 1435 and ended at 1455. 500 grams removed. 500 mls of NS given post phlebotomy. Patient observed for 30 minutes after procedure without any incident. Patient tolerated procedure well. 18 G needle removed intact from left AC.

## 2023-07-10 NOTE — Progress Notes (Signed)
 At 1545, VSS, denies distress. Appox 500mls of NS infused. IV site D/Ced. Dischrge to home. Steady gait noted

## 2023-07-10 NOTE — Patient Instructions (Signed)

## 2023-07-11 ENCOUNTER — Ambulatory Visit: Payer: Self-pay | Admitting: Hematology & Oncology

## 2023-07-11 ENCOUNTER — Encounter: Payer: Self-pay | Admitting: *Deleted

## 2023-07-11 LAB — IRON AND IRON BINDING CAPACITY (CC-WL,HP ONLY)
Iron: 100 ug/dL (ref 28–170)
Saturation Ratios: 31 % (ref 10.4–31.8)
TIBC: 326 ug/dL (ref 250–450)
UIBC: 226 ug/dL (ref 148–442)

## 2023-07-13 ENCOUNTER — Encounter: Payer: Self-pay | Admitting: Hematology & Oncology

## 2023-07-18 ENCOUNTER — Encounter: Payer: Self-pay | Admitting: Allergy and Immunology

## 2023-07-18 ENCOUNTER — Ambulatory Visit: Admitting: Allergy and Immunology

## 2023-07-18 VITALS — BP 110/62 | HR 88 | Resp 18 | Ht 66.0 in | Wt 151.8 lb

## 2023-07-18 DIAGNOSIS — R519 Headache, unspecified: Secondary | ICD-10-CM

## 2023-07-18 DIAGNOSIS — J301 Allergic rhinitis due to pollen: Secondary | ICD-10-CM | POA: Diagnosis not present

## 2023-07-18 DIAGNOSIS — I5189 Other ill-defined heart diseases: Secondary | ICD-10-CM

## 2023-07-18 DIAGNOSIS — K219 Gastro-esophageal reflux disease without esophagitis: Secondary | ICD-10-CM

## 2023-07-18 DIAGNOSIS — R0602 Shortness of breath: Secondary | ICD-10-CM

## 2023-07-18 DIAGNOSIS — M3500 Sicca syndrome, unspecified: Secondary | ICD-10-CM | POA: Diagnosis not present

## 2023-07-18 DIAGNOSIS — J4540 Moderate persistent asthma, uncomplicated: Secondary | ICD-10-CM | POA: Diagnosis not present

## 2023-07-18 DIAGNOSIS — J3089 Other allergic rhinitis: Secondary | ICD-10-CM

## 2023-07-18 MED ORDER — FAMOTIDINE 20 MG PO TABS: ORAL_TABLET | ORAL | 5 refills | Status: DC

## 2023-07-18 MED ORDER — AIRSUPRA 90-80 MCG/ACT IN AERO: 2.0000 | INHALATION_SPRAY | Freq: Four times a day (QID) | RESPIRATORY_TRACT | 1 refills | Status: AC | PRN

## 2023-07-18 NOTE — Progress Notes (Unsigned)
 [place] - [place] - [place] - [place] - [place]   Dear Vargas,  Thank you for referring Yolanda Vargas to the [facility] [place]  on 07/18/2023.   Below is a summation of this patient's evaluation and recommendations.  Thank you for your referral. I will keep you informed about this patient's response to treatment.   If you have any questions please do not hesitate to contact me.   Sincerely,  Yolanda DOROTHA Denis, MD Allergy / Immunology [facility] [place]    ______________________________________________________________________    NEW PATIENT NOTE  Referring Provider: [name], MD Primary Provider: Ricky Alfrieda DASEN, DO Date of office visit: 07/18/2023    Subjective:   Chief Complaint:  Yolanda Vargas (DOB: 11/04/1950) is a 73 y.o. female who presents to the clinic on 07/18/2023 with a chief complaint of Allergic Rhinitis , Asthma, and Establish Care .     HPI: [name] presents to this clinic in evaluation of multiple issues.  She has seen a lot of physicians since she has had a lot of diagnostic studies done and she has been given lots of diagnoses and I tried to focus today on what actually bothers her and what she would like to have eliminated to make her life better.  She states that she would like to breathe again.  Apparently over the course of the past several years she has had problems with shortness of breath and significant dyspnea on exertion which has been a progressive issue sometimes associated with wheezing of expiratory nature and sometimes associated with a cough.  She has been consistently using Symbicort  but when reviewing her technique today it is not particularly good if she does not empty her lungs prior to using Symbicort .  She has apparently been treated with several biologic agents but apparently had side effects from the use of those agents.  She also has springtime nasal  congestion and itchy eyes.  Apparently she has been on immunotherapy for the past 2 years directed against various aeroallergens but this has really not resulted in any improvement regarding this issue.  She has tried Flonase  in the past but it makes her nose so dry and she has had some bleeding from her nose on occasion.  She also complains of having a headache upon awakening in the morning that will last an hour or so.  This is a pretty consistent issue.  She may have been checked for sleep apnea in the past but at this point in time she does not use any type of device to treat sleep apnea.  She does have a history of migraines with visual aura but those migraine headaches are very infrequent.  She does have reflux with burping.  She is apparently had a gastric ulcer and was treated with a proton pump inhibitor and has had several endoscopies and evaluation of that issue over the course of the past several years.  Currently she is not using any treatment except some occasional Pepcid .  She has very dry mouth and she has dry eye.  She also has a diagnosis of hemochromatosis made over the course of the past year and has undergone phlebotomy.  She believes that she is developing some skin problems as a function of that disease today.  She gets blue patches of skin on her face and purple spots on her skin and flushed skin involving her arms and legs and feet.  She has  also developed intermittent red palms and she believes that the veins on her lower extremities are starting to become engorged.  She has seen a local allergist in [place] who has placed her on immunotherapy for 2 years, a pulmonologist in [place] who has performed multiple diagnostic studies, and oncologist in [place] and [place] who is addressing her hemochromatosis with phlebotomy, a cardiologist, and at some point in time has seen an ENT doctor.  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   Surgery had to be postponed     Depression    Dyspepsia 03/25/1997   One of my most prominent health issues, diagnosed at this time     Excessive vitamin B12 intake 06/02/2021   Lab reports show excessive Vitamin B12     Family history of bladder cancer    Family history of breast cancer    Family history of heart disease    Family history of ovarian cancer    Fatty (change of) liver, not elsewhere classified 08/30/2015   Female bladder prolapse    Gastritis 03/22/2023   First detected, during EGD     Gastroesophageal reflux disease without esophagitis 12/04/2016   Generalized anxiety disorder 03/05/2017   GERD (gastroesophageal reflux disease)    Hemochromatosis 01/08/2023   Hereditary hemochromatosis (HCC)    Hypertension    Hypothyroidism    Insulin resistance    Kidney stone 07/20/2022   Punctuate in size, right kidney     Left groin pain    Lyme disease    around 2009   Moderate persistent asthma 08/31/2015   Francis     NAFL (nonalcoholic fatty liver)    Osteopenia    Peripheral edema    Raynaud's phenomenon 12/30/2008   Raynaud's phenomenon without gangrene    Recurrent UTI 08/17/2022   Right groin pain    Seasonal allergic rhinitis 02/24/2014   Shortness of breath    Thyroid  goiter    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     Vaginal prolapse     Past Surgical History:  Procedure Laterality Date   BREAST BIOPSY Left    2018   CERVICAL CONE BIOPSY     CHOLECYSTECTOMY     COLONOSCOPY     ESOPHAGOGASTRODUODENOSCOPY     KNEE SURGERY     Bursa removed   LAPAROSCOPIC HYSTERECTOMY  2020    Allergies as of 07/18/2023       Reactions   Diltiazem Hcl Other (See Comments)   Stoke like  symptoms/more like balance issues, lightheaded, feeling faint   Penicillins Rash   Doxycycline Rash   Hydrocodone Palpitations        Medication List    Airsupra  90-80 MCG/ACT Aero Generic drug: Albuterol -Budesonide  Inhale 2 puffs into the lungs every 6 (six) hours as needed. Started by: Julie Paolini J Kima Malenfant   albuterol  108 (90 Base) MCG/ACT inhaler Commonly known as: Ventolin  HFA Inhale 2 puffs into the lungs every 6 (six) hours as needed for wheezing or shortness of breath.   amLODipine 5 MG tablet Commonly known as: NORVASC Take 5 mg by mouth daily.   budesonide -formoterol  160-4.5 MCG/ACT  inhaler Commonly known as: SYMBICORT  Inhale 2 puffs into the lungs 2 (two) times daily.   fluticasone  50 MCG/ACT nasal spray Commonly known as: FLONASE  Place 1 spray into both nostrils daily.   levothyroxine 75 MCG tablet Commonly known as: SYNTHROID Take 75 mcg by mouth daily before breakfast.   rosuvastatin 10 MG tablet Commonly known as: CRESTOR Take 10 mg by mouth at bedtime.   sertraline 50 MG tablet Commonly known as: ZOLOFT Take 75 mg by mouth daily.    Review of systems negative except as noted in HPI / PMHx or noted below:  Review of Systems  Constitutional: Negative.   HENT: Negative.    Eyes: Negative.   Respiratory: Negative.    Cardiovascular: Negative.   Gastrointestinal: Negative.   Genitourinary: Negative.   Musculoskeletal: Negative.   Skin: Negative.   Neurological: Negative.   Endo/Heme/Allergies: Negative.   Psychiatric/Behavioral: Negative.      Family History  Problem Relation Age of Onset   Breast cancer Mother 44       bilateral   Diabetes Father    Heart attack Father    Coronary artery disease Father    Ovarian cancer Sister 26   Bladder Cancer Sister 33   Heart disease Brother    Hypertension Brother    Cancer Maternal Aunt        Eye   Esophageal cancer Maternal Aunt    Polycythemia Maternal Uncle    Lung cancer Paternal Aunt 25    Pneumonia Maternal Grandmother    Congestive Heart Failure Paternal Grandfather    Cancer Cousin 80       unknown    Social History   Socioeconomic History   Marital status: Divorced    Spouse name: Not on file   Number of children: 3   Years of education: 12+6+60 hours   Highest education level: Master's degree (e.g., MA, MS, MEng, MEd, MSW, MBA)  Occupational History    Comment: RETIRED  Tobacco Use   Smoking status: Never   Smokeless tobacco: Never  Vaping Use   Vaping status: Never Used  Substance and Sexual Activity   Alcohol use: Not Currently   Drug use: Never   Sexual activity: Not Currently    Partners: Male    Birth control/protection: Surgical    Comment: first sexual encounter at 3, less than 5, hysterectomy  Other Topics Concern   Not on file  Social History Narrative   Not on file   Social Drivers of Health   Financial Resource Strain: Not on file  Food Insecurity: No Food Insecurity (06/22/2023)   Hunger Vital Sign    Worried About Running Out of Food in the Last Year: Never true    Ran Out of Food in the Last Year: Never true  Transportation Needs: No Transportation Needs (06/22/2023)   PRAPARE - Administrator, Civil Service (Medical): No    Lack of Transportation (Non-Medical): No  Physical Activity: Not on file  Stress: Not on file  Social Connections: Unknown (05/17/2021)   [facility] from [facility]   Social Network    Social Network: Not on file  Intimate Partner Violence: Not At Risk (06/22/2023)   Humiliation, Afraid, Rape, and Kick questionnaire    Fear of Current or Ex-Partner: No    Emotionally Abused: No    Physically Abused: No    Sexually Abused: No    Environmental and Social history  Lives in a house with a dry environment, dog  looking inside the household, no carpet in the bedroom, no plastic in the bed, no plastic on the pillow, no smoking ongoing psych consult.  She is a retired Adult nurse school  principal.  Objective:   Vitals:   07/18/23 1409  BP: 110/62  Pulse: 88  Resp: 18  SpO2: 97%   Height: 5' 6 (167.6 cm) Weight: 151 lb 12.8 oz (68.9 kg)  Physical Exam Constitutional:      Appearance: She is not diaphoretic.  HENT:     Head: Normocephalic.     Right Ear: Tympanic membrane, ear canal and external ear normal.     Left Ear: Tympanic membrane, ear canal and external ear normal.     Nose: Nose normal. No mucosal edema or rhinorrhea.     Mouth/Throat:     Pharynx: Uvula midline. No oropharyngeal exudate.  Eyes:     Conjunctiva/sclera: Conjunctivae normal.  Neck:     Thyroid : No thyromegaly.     Trachea: Trachea normal. No tracheal tenderness or tracheal deviation.  Cardiovascular:     Rate and Rhythm: Normal rate and regular rhythm.     Heart sounds: Normal heart sounds, S1 normal and S2 normal. No murmur heard. Pulmonary:     Effort: No respiratory distress.     Breath sounds: Normal breath sounds. No stridor. No wheezing or rales.  Lymphadenopathy:     Head:     Right side of head: No tonsillar adenopathy.     Left side of head: No tonsillar adenopathy.     Cervical: No cervical adenopathy.  Skin:    Findings: No erythema or rash.     Nails: There is no clubbing.  Neurological:     Mental Status: She is alert.     Diagnostics: Allergy skin tests were performed.   Spirometry was performed and demonstrated an FEV1 of 1.92 @ 85 % of predicted. FEV1/FVC = 0.75  Results of blood test obtained 10 July 2023 identifies creatinine 0.77 Mg/DL, AST 79L/O, ALT 84L/O, ferritin 196 NG/mL, WBC 8.2, absolute eosinophil 100, absolute lymphocyte 1300, hemoglobin 13.0, platelet 279  Results of blood test obtained 20 December 2022 identifies negative ANA, negative RA latex turbid.  Results of pulmonary function test obtained 08 December 2019 identifies TLC 106% predicted, RV 108% predicted, DL/VA 898% predicted  Results of a cardiac CT calcium scoring scan obtained 23 February 2023 identified the following:  Cardiovascular: There are no significant extracardiac vascular findings. Mediastinum/Nodes: There are no enlarged lymph nodes within the visualized mediastinum. Lungs/Pleura: There is no pleural effusion. The visualized lungs appear clear.  Results of an echocardiogram obtained 03 July 2023 identified the following:   1. Left ventricular ejection fraction, by estimation, is 60 to 65%. The  left ventricle has normal function. The left ventricle has no regional  wall motion abnormalities. Left ventricular diastolic parameters are  consistent with Grade I diastolic  dysfunction (impaired relaxation). The average left ventricular global  longitudinal strain is -16.9 %. The global longitudinal strain is  abnormal.   2. Right ventricular systolic function is normal. The right ventricular  size is normal.   3. The mitral valve is normal in structure. No evidence of mitral valve  regurgitation. No evidence of mitral stenosis.   4. The aortic valve is normal in structure. Aortic valve regurgitation is  mild. No aortic stenosis is present.   5. The inferior vena cava is normal in size with greater than 50%  respiratory variability, suggesting right atrial pressure of  3 mmHg.   Assessment and Plan:    No diagnosis found.  Patient Instructions   1. Will review all studies and consider additional studies  2. Treat and prevent inflammation of airway:   A. Symbicort  160 - 2 inhalations 2 times per day (empty lungs)  3. Obtain nocturnal oximetry   4. Avoid all oral antihistamines secondary to dry mouth and eyes  5. For dry eye and dry mouth can use:   A. Systane eye drops  B. Biotene mouthwash  6. If needed:   A. Airsupra  - 2 inhalations every 4-6 hours  B. Nasal saline  7. Treat and prevent reflux:   A. Minimize any caffeine and chocolate and alcohol consumption  B. Famotidine  20 mg - 1 tablet 1 time per day  8. Influenza = Tamiflu.  Covid = Paxlovid   Yolanda DOROTHA Denis, MD Allergy / Immunology [facility] [place] 

## 2023-07-18 NOTE — Patient Instructions (Addendum)
  1. Will review all studies and consider additional studies  2. Treat and prevent inflammation of airway:   A. Symbicort  160 - 2 inhalations 2 times per day (empty lungs)  3. Obtain nocturnal oximetry   4. Avoid all oral antihistamines secondary to dry mouth and eyes  5. For dry eye and dry mouth can use:   A. Systane eye drops  B. Biotene mouthwash  6. If needed:   A. Airsupra  - 2 inhalations every 4-6 hours  B. Nasal saline  7. Treat and prevent reflux:   A. Minimize any caffeine and chocolate and alcohol consumption  B. Famotidine  20 mg - 1 tablet 1 time per day  8. Influenza = Tamiflu. Covid = Paxlovid

## 2023-07-19 ENCOUNTER — Encounter

## 2023-07-19 ENCOUNTER — Encounter: Payer: Self-pay | Admitting: Allergy and Immunology

## 2023-07-19 ENCOUNTER — Inpatient Hospital Stay

## 2023-07-19 ENCOUNTER — Inpatient Hospital Stay (HOSPITAL_BASED_OUTPATIENT_CLINIC_OR_DEPARTMENT_OTHER): Admitting: Medical Oncology

## 2023-07-19 VITALS — BP 125/62 | HR 77 | Temp 97.4°F | Resp 18

## 2023-07-19 DIAGNOSIS — M79604 Pain in right leg: Secondary | ICD-10-CM

## 2023-07-19 MED ORDER — SODIUM CHLORIDE 0.9 % IV SOLN: Freq: Once | INTRAVENOUS | Status: AC

## 2023-07-19 NOTE — Progress Notes (Signed)
 1 unit therapeutic phlebotomy performed over 30 minutes using a 20 g IV catheter via the left AC. Patient tolerated well Nourishment provided. IV rehydration given as ordered. Patient tolerated well.   Patient complains of pain in right lower shin. Patient states the pain is intermittent since Sunday. Patient denies swelling, redness or warmth at the site. Lauraine Dais, PA notified. Patient to see Lauraine Dais, PA today.

## 2023-07-19 NOTE — Patient Instructions (Signed)

## 2023-07-19 NOTE — Progress Notes (Signed)
 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 While in office getting a phlebotomy she mentions concerns for right shin pain. She is concerned that she could have a DVT.   Pain started Monday while at her daughter house. No known injury. Area was painful to touch but resolved within a few hours. No bruising, bleeding, edema. No calf pain, chest pain or SOB. She did not take anything for pain.   She does not take hormonal replacement She is a non-smoker She did have a colonoscopy in May but denies any other procedures.  No immobility in 3 months No history of DVT/PE.   She does not smoke.  She does not have any significant alcohol use.  Overall, I would say that her performance status is probably ECOG 1.  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   Surgery had to be postponed     Depression    Dyspepsia 03/25/1997   One of my most prominent health issues, diagnosed at this time     Excessive vitamin B12 intake 06/02/2021   Lab reports show excessive Vitamin B12     Family history of bladder cancer    Family history of breast cancer    Family history of heart disease    Family history of ovarian cancer    Fatty (change of) liver, not elsewhere classified 08/30/2015   Female bladder prolapse    Gastritis 03/22/2023   First detected, during EGD     Gastroesophageal reflux disease without esophagitis 12/04/2016   Generalized anxiety disorder  03/05/2017   GERD (gastroesophageal reflux disease)    Hemochromatosis 01/08/2023   Hereditary hemochromatosis (HCC)    Hypertension    Hypothyroidism    Insulin resistance    Kidney stone 07/20/2022   Punctuate in size, right kidney     Left groin pain    Lyme disease    around 2009   Moderate persistent asthma 08/31/2015   Francis     NAFL (nonalcoholic fatty liver)    Osteopenia    Peripheral edema    Raynaud's phenomenon 12/30/2008   Raynaud's phenomenon without gangrene    Recurrent UTI 08/17/2022   Right groin pain    Seasonal allergic rhinitis 02/24/2014   Shortness of breath    Thyroid  goiter    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     Vaginal prolapse   :   Past Surgical History:  Procedure Laterality Date   BREAST BIOPSY Left    2018   CERVICAL CONE BIOPSY     CHOLECYSTECTOMY     COLONOSCOPY     ESOPHAGOGASTRODUODENOSCOPY     KNEE SURGERY     Bursa removed   LAPAROSCOPIC HYSTERECTOMY  2020  :   Current Outpatient Medications:    AIRSUPRA  90-80 MCG/ACT AERO, Inhale 2 puffs into the lungs every 6 (six) hours as needed., Disp: 10.7 g, Rfl: 1   albuterol  (VENTOLIN   HFA) 108 (90 Base) MCG/ACT inhaler, Inhale 2 puffs into the lungs every 6 (six) hours as needed for wheezing or shortness of breath., Disp: 1 each, Rfl: 11   amLODipine (NORVASC) 5 MG tablet, Take 5 mg by mouth daily., Disp: , Rfl:    budesonide -formoterol  (SYMBICORT ) 160-4.5 MCG/ACT inhaler, Inhale 2 puffs into the lungs 2 (two) times daily., Disp: 10.2 g, Rfl: 6   famotidine  (PEPCID ) 20 MG tablet, Take 1 tablet once daily, Disp: 30 tablet, Rfl: 5   fluticasone  (FLONASE ) 50 MCG/ACT nasal spray, Place 1 spray into both nostrils daily., Disp: 16 g, Rfl: 1   levothyroxine (SYNTHROID) 75 MCG tablet, Take 75 mcg by mouth daily before breakfast., Disp: , Rfl:    rosuvastatin (CRESTOR) 10 MG tablet, Take 10 mg by mouth at bedtime., Disp: , Rfl:    sertraline (ZOLOFT) 50 MG tablet,  Take 75 mg by mouth daily., Disp: , Rfl:  No current facility-administered medications for this visit.  Facility-Administered Medications Ordered in Other Visits:    0.9 %  sodium chloride  infusion, , Intravenous, Once, Lewis, Dequincy A, MD:    Allergies  Allergen Reactions   Diltiazem Hcl Other (See Comments)    Stoke like symptoms/more like balance issues, lightheaded, feeling faint     Penicillins Rash   Doxycycline Rash   Hydrocodone Palpitations   Family History  Problem Relation Age of Onset   Breast cancer Mother 35       bilateral   Diabetes Father    Heart attack Father    Coronary artery disease Father    Ovarian cancer Sister 2   Bladder Cancer Sister 58   Heart disease Brother    Hypertension Brother    Cancer Maternal Aunt        Eye   Esophageal cancer Maternal Aunt    Polycythemia Maternal Uncle    Lung cancer Paternal Aunt 44   Pneumonia Maternal Grandmother    Congestive Heart Failure Paternal Grandfather    Cancer Cousin 37       unknown  :   Social History   Socioeconomic History   Marital status: Divorced    Spouse name: Not on file   Number of children: 3   Years of education: 12+6+60 hours   Highest education level: Master's degree (e.g., MA, MS, MEng, MEd, MSW, MBA)  Occupational History    Comment: RETIRED  Tobacco Use   Smoking status: Never   Smokeless tobacco: Never  Vaping Use   Vaping status: Never Used  Substance and Sexual Activity   Alcohol use: Not Currently   Drug use: Never   Sexual activity: Not Currently    Partners: Male    Birth control/protection: Surgical    Comment: first sexual encounter at 38, less than 5, hysterectomy  Other Topics Concern   Not on file  Social History Narrative   Not on file   Social Drivers of Health   Financial Resource Strain: Not on file  Food Insecurity: No Food Insecurity (06/22/2023)   Hunger Vital Sign    Worried About Running Out of Food in the Last Year: Never true     Ran Out of Food in the Last Year: Never true  Transportation Needs: No Transportation Needs (06/22/2023)   PRAPARE - Administrator, Civil Service (Medical): No    Lack of Transportation (Non-Medical): No  Physical Activity: Not on file  Stress: Not on file  Social Connections: Unknown (05/17/2021)   Received from  [facility]   Social Network    Social Network: Not on file  Intimate Partner Violence: Not At Risk (06/22/2023)   Humiliation, Afraid, Rape, and Kick questionnaire    Fear of Current or Ex-Partner: No    Emotionally Abused: No    Physically Abused: No    Sexually Abused: No  :  Review of Systems  Constitutional: Negative.   HENT: Negative.    Eyes: Negative.   Respiratory: Negative.    Cardiovascular: Negative.   Gastrointestinal: Negative.   Genitourinary: Negative.   Musculoskeletal: Negative.   Skin: Negative.   Neurological: Negative.   Endo/Heme/Allergies: Negative.   Psychiatric/Behavioral: Negative.      Vitals:   07/19/23 1439  BP: 125/62  Pulse: 77  Resp: 18  Temp: (!) 97.4 F (36.3 C)  SpO2: 97%     Physical Exam Vitals reviewed.  HENT:     Head: Normocephalic and atraumatic.  Eyes:     Pupils: Pupils are equal, round, and reactive to light.  Cardiovascular:     Rate and Rhythm: Normal rate and regular rhythm.     Pulses: Normal pulses.     Heart sounds: Normal heart sounds.  Pulmonary:     Effort: Pulmonary effort is normal.     Breath sounds: Normal breath sounds.  Abdominal:     General: Bowel sounds are normal.     Palpations: Abdomen is soft.  Musculoskeletal:        General: No swelling, tenderness, deformity or signs of injury. Normal range of motion.     Cervical back: Normal range of motion.     Right lower leg: No edema.     Left lower leg: No edema.     Comments: Negative Homans sign of right leg   Lymphadenopathy:     Cervical: No cervical adenopathy.  Skin:    General: Skin is warm and dry.      Findings: No bruising, erythema, lesion or rash.  Neurological:     Mental Status: She is alert and oriented to person, place, and time.  Psychiatric:        Behavior: Behavior normal.        Thought Content: Thought content normal.        Judgment: Judgment normal.     Encounter Diagnosis  Name Primary?   Pain of right lower extremity Yes    Assessment and Plan: Ms. Wilhoite is a very nice 72 year old white female. She has a fairly active form of hemochromatosis.  Today her concern if for a potential DVT.  Fortunately her symptoms have resolved and there is no evidence of DVT on exam. She does not have any evidence of PE or stroke based off of vitals, history, exam.  We discussed red flag signs and symptoms to be on alert for and if they should occur she should call us  so we can place an order for a doppler scan or CTA.   For now she will continue her regimen for her hereditary hemochromatosis.

## 2023-07-20 DIAGNOSIS — R5383 Other fatigue: Secondary | ICD-10-CM | POA: Diagnosis not present

## 2023-07-20 DIAGNOSIS — R0602 Shortness of breath: Secondary | ICD-10-CM | POA: Diagnosis not present

## 2023-07-21 LAB — BASIC METABOLIC PANEL WITH GFR
BUN/Creatinine Ratio: 23 (ref 12–28)
BUN: 16 mg/dL (ref 8–27)
CO2: 23 mmol/L (ref 20–29)
Calcium: 9.5 mg/dL (ref 8.7–10.3)
Chloride: 103 mmol/L (ref 96–106)
Creatinine, Ser: 0.7 mg/dL (ref 0.57–1.00)
Glucose: 67 mg/dL — ABNORMAL LOW (ref 70–99)
Potassium: 4.3 mmol/L (ref 3.5–5.2)
Sodium: 142 mmol/L (ref 134–144)
eGFR: 91 mL/min/1.73 (ref >59)

## 2023-07-21 LAB — PRO B NATRIURETIC PEPTIDE: NT-Pro BNP: 59 pg/mL (ref 0–301)

## 2023-07-25 ENCOUNTER — Other Ambulatory Visit: Payer: Self-pay | Admitting: *Deleted

## 2023-07-25 ENCOUNTER — Ambulatory Visit: Admitting: Physical Medicine and Rehabilitation

## 2023-07-25 ENCOUNTER — Telehealth: Payer: Self-pay

## 2023-07-25 DIAGNOSIS — M79642 Pain in left hand: Secondary | ICD-10-CM | POA: Diagnosis not present

## 2023-07-25 DIAGNOSIS — R29898 Other symptoms and signs involving the musculoskeletal system: Secondary | ICD-10-CM

## 2023-07-25 DIAGNOSIS — R202 Paresthesia of skin: Secondary | ICD-10-CM | POA: Diagnosis not present

## 2023-07-25 DIAGNOSIS — I5189 Other ill-defined heart diseases: Secondary | ICD-10-CM

## 2023-07-25 DIAGNOSIS — J3089 Other allergic rhinitis: Secondary | ICD-10-CM

## 2023-07-25 DIAGNOSIS — M3500 Sicca syndrome, unspecified: Secondary | ICD-10-CM

## 2023-07-25 DIAGNOSIS — M25532 Pain in left wrist: Secondary | ICD-10-CM

## 2023-07-25 DIAGNOSIS — M79641 Pain in right hand: Secondary | ICD-10-CM | POA: Diagnosis not present

## 2023-07-25 DIAGNOSIS — M25531 Pain in right wrist: Secondary | ICD-10-CM | POA: Diagnosis not present

## 2023-07-25 DIAGNOSIS — J4540 Moderate persistent asthma, uncomplicated: Secondary | ICD-10-CM

## 2023-07-25 DIAGNOSIS — R519 Headache, unspecified: Secondary | ICD-10-CM

## 2023-07-25 MED ORDER — RYALTRIS 665-25 MCG/ACT NA SUSP: NASAL | 5 refills | Status: DC

## 2023-07-25 NOTE — Progress Notes (Signed)
 Pain Scale   Average Pain 5 Patient advised she has bilateral hand and wrist numbness, tingling and some weakness. Patient is Right hand dominate.        +Driver, -BT, -Dye Allergies.

## 2023-07-25 NOTE — Telephone Encounter (Signed)
-----   Message from ERIC J KOZLOW sent at 07/19/2023  7:00 AM EDT ----- Please let Diane know that I was able to look through most of her studies.  We need to obtain a few other studies and have her use some additional medications.  We can have her use the Ryaltris  -2 sprays each nostril 1 or 2 times per day which has both a nasal steroid and a nasal antihistamine contained within it for her upper airway disease.  This is probably going to be more important to use during the pollination seasons of the year and she should use this medication preventatively during those times of the year.  Lets have her obtain some blood tests.AM cortisol, area 2 aeroallergen profile, TSH, FT4, thyroid  peroxidase antibody, IgA/G/M.  Have her return to see me in 4 weeks.

## 2023-07-25 NOTE — Telephone Encounter (Signed)
 Sent MyChart message to patient

## 2023-07-26 ENCOUNTER — Inpatient Hospital Stay

## 2023-07-26 ENCOUNTER — Other Ambulatory Visit: Payer: Self-pay | Admitting: Medical Oncology

## 2023-07-26 ENCOUNTER — Inpatient Hospital Stay (HOSPITAL_BASED_OUTPATIENT_CLINIC_OR_DEPARTMENT_OTHER): Admitting: Hematology & Oncology

## 2023-07-26 ENCOUNTER — Ambulatory Visit: Payer: Self-pay | Admitting: Cardiology

## 2023-07-26 ENCOUNTER — Ambulatory Visit: Payer: Self-pay | Admitting: Hematology & Oncology

## 2023-07-26 ENCOUNTER — Encounter: Payer: Self-pay | Admitting: Hematology & Oncology

## 2023-07-26 DIAGNOSIS — N135 Crossing vessel and stricture of ureter without hydronephrosis: Secondary | ICD-10-CM

## 2023-07-26 DIAGNOSIS — R230 Cyanosis: Secondary | ICD-10-CM

## 2023-07-26 LAB — CBC WITH DIFFERENTIAL (CANCER CENTER ONLY)
Abs Immature Granulocytes: 0.07 K/uL (ref 0.00–0.07)
Basophils Absolute: 0.1 K/uL (ref 0.0–0.1)
Basophils Relative: 1 %
Eosinophils Absolute: 0.2 K/uL (ref 0.0–0.5)
Eosinophils Relative: 3 %
HCT: 37 % (ref 36.0–46.0)
Hemoglobin: 12.2 g/dL (ref 12.0–15.0)
Immature Granulocytes: 1 %
Lymphocytes Relative: 23 %
Lymphs Abs: 1.4 K/uL (ref 0.7–4.0)
MCH: 33.8 pg (ref 26.0–34.0)
MCHC: 33 g/dL (ref 30.0–36.0)
MCV: 102.5 fL — ABNORMAL HIGH (ref 80.0–100.0)
Monocytes Absolute: 0.5 K/uL (ref 0.1–1.0)
Monocytes Relative: 9 %
Neutro Abs: 3.9 K/uL (ref 1.7–7.7)
Neutrophils Relative %: 63 %
Platelet Count: 233 K/uL (ref 150–400)
RBC: 3.61 MIL/uL — ABNORMAL LOW (ref 3.87–5.11)
RDW: 13.2 % (ref 11.5–15.5)
WBC Count: 6.2 K/uL (ref 4.0–10.5)
nRBC: 0 % (ref 0.0–0.2)

## 2023-07-26 LAB — COMPREHENSIVE METABOLIC PANEL WITH GFR
ALT: 19 U/L (ref 0–44)
AST: 25 U/L (ref 15–41)
Albumin: 4.4 g/dL (ref 3.5–5.0)
Alkaline Phosphatase: 69 U/L (ref 38–126)
Anion gap: 13 (ref 5–15)
BUN: 16 mg/dL (ref 8–23)
CO2: 24 mmol/L (ref 22–32)
Calcium: 9.7 mg/dL (ref 8.9–10.3)
Chloride: 104 mmol/L (ref 98–111)
Creatinine, Ser: 0.74 mg/dL (ref 0.44–1.00)
GFR, Estimated: >60 mL/min (ref >60)
Glucose, Bld: 94 mg/dL (ref 70–99)
Potassium: 4.2 mmol/L (ref 3.5–5.1)
Sodium: 140 mmol/L (ref 135–145)
Total Bilirubin: 0.3 mg/dL (ref 0.0–1.2)
Total Protein: 6.9 g/dL (ref 6.5–8.1)

## 2023-07-26 LAB — IRON AND IRON BINDING CAPACITY (CC-WL,HP ONLY)
Iron: 73 ug/dL (ref 28–170)
Saturation Ratios: 20 % (ref 10.4–31.8)
TIBC: 361 ug/dL (ref 250–450)
UIBC: 288 ug/dL

## 2023-07-26 LAB — FERRITIN: Ferritin: 108 ng/mL (ref 11–307)

## 2023-07-26 MED ORDER — SODIUM CHLORIDE 0.9 % IV SOLN: INTRAVENOUS | Status: DC

## 2023-07-26 NOTE — Patient Instructions (Signed)

## 2023-07-26 NOTE — Progress Notes (Signed)
 Yolanda Vargas presents today for phlebotomy per MD orders. Phlebotomy procedure started at 1012 and ended at 1028. 510 grams removed via 18 gauge needle to right AC. Patient observed for 30 minutes after procedure without any incident while receiving IVF. Patient tolerated procedure well. IV needle removed intact.

## 2023-07-26 NOTE — Progress Notes (Signed)
 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 Underdown is a 73 year old white female who we follow for Hemochromatosis. While in office getting a phlebotomy she mentions concerns for right shin pain. She is concerned that she could have a DVT.   Pain started Monday while at her daughter house. No known injury. Area was painful to touch but resolved within a few hours. No bruising, bleeding, edema. No calf pain, chest pain or SOB. She did not take anything for pain.   She does not take hormonal replacement She is a non-smoker She did have a colonoscopy in May but denies any other procedures.  No immobility in 3 months No history of DVT/PE.   She does not smoke.  She does not have any significant alcohol use.  Overall, I would say that her performance status is probably ECOG 1.  Past Medical History:  Diagnosis Date  . Abnormal mammogram 04/12/2015   Removal Reason: resolved    . Abnormal Pap smear of cervix   . Adenomatous polyps 03/22/2023   In colon    . Anxiety   . Arthropathy 09/29/2014  . Asthma   . Benign hypertension 04/26/2016  . Bluish skin   . 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   Surgery had to be postponed    . Depression   . Dyspepsia 03/25/1997   One of my most prominent health issues, diagnosed at this time    . Excessive vitamin B12 intake 06/02/2021   Lab reports show excessive Vitamin B12    . Family history of bladder cancer   . Family history of breast cancer   . Family history of heart disease   . Family history of ovarian cancer   . Fatty (change of) liver, not elsewhere classified 08/30/2015  . Female bladder prolapse   . Gastritis 03/22/2023   First detected, during EGD    . Gastroesophageal reflux disease without esophagitis 12/04/2016  . Generalized  anxiety disorder 03/05/2017  . GERD (gastroesophageal reflux disease)   . Hemochromatosis 01/08/2023  . Hereditary hemochromatosis (HCC)   . Hypertension   . Hypothyroidism   . Insulin resistance   . Kidney stone 07/20/2022   Punctuate in size, right kidney    . Left groin pain   . Lyme disease    around 2009  . Moderate persistent asthma 08/31/2015   Francis    . NAFL (nonalcoholic fatty liver)   . Osteopenia   . Peripheral edema   . Raynaud's phenomenon 12/30/2008  . Raynaud's phenomenon without gangrene   . Recurrent UTI 08/17/2022  . Right groin pain   . Seasonal allergic rhinitis 02/24/2014  . Shortness of breath   . Thyroid  goiter   . 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    . Vaginal prolapse   :   Past Surgical History:  Procedure Laterality Date  . BREAST BIOPSY Left    2018  . CERVICAL CONE BIOPSY    . CHOLECYSTECTOMY    . COLONOSCOPY    . ESOPHAGOGASTRODUODENOSCOPY    . KNEE SURGERY     Bursa removed  . LAPAROSCOPIC HYSTERECTOMY  2020  :   Current Outpatient Medications:  .  AIRSUPRA  90-80 MCG/ACT AERO, Inhale 2 puffs into the lungs every 6 (six) hours as needed., Disp: 10.7 g, Rfl: 1 .  amLODipine (NORVASC)  5 MG tablet, Take 5 mg by mouth daily., Disp: , Rfl:  .  budesonide -formoterol  (SYMBICORT ) 160-4.5 MCG/ACT inhaler, Inhale 2 puffs into the lungs 2 (two) times daily., Disp: 10.2 g, Rfl: 6 .  famotidine  (PEPCID ) 20 MG tablet, Take 1 tablet once daily, Disp: 30 tablet, Rfl: 5 .  fluticasone  (FLONASE ) 50 MCG/ACT nasal spray, Place 1 spray into both nostrils daily., Disp: 16 g, Rfl: 1 .  levothyroxine (SYNTHROID) 75 MCG tablet, Take 75 mcg by mouth daily before breakfast., Disp: , Rfl:  .  rosuvastatin (CRESTOR) 10 MG tablet, Take 10 mg by mouth at bedtime., Disp: , Rfl:  .  RYALTRIS  665-25 MCG/ACT SUSP, Use two sprays in each nostril one to two times daily as directed., Disp: 29 g, Rfl: 5 .  sertraline (ZOLOFT) 50 MG tablet, Take  75 mg by mouth daily., Disp: , Rfl:  No current facility-administered medications for this visit.  Facility-Administered Medications Ordered in Other Visits:  .  0.9 %  sodium chloride  infusion, , Intravenous, Once, Lewis, Dequincy A, MD:    Allergies  Allergen Reactions  . Diltiazem Hcl Other (See Comments)    Stoke like symptoms/more like balance issues, lightheaded, feeling faint    . Hydrocodone Palpitations  . Penicillins Rash  . Doxycycline Rash   Family History  Problem Relation Age of Onset  . Breast cancer Mother 63       bilateral  . Diabetes Father   . Heart attack Father   . Coronary artery disease Father   . Ovarian cancer Sister 2  . Bladder Cancer Sister 37  . Heart disease Brother   . Hypertension Brother   . Cancer Maternal Aunt        Eye  . Esophageal cancer Maternal Aunt   . Polycythemia Maternal Uncle   . Lung cancer Paternal Aunt 31  . Pneumonia Maternal Grandmother   . Congestive Heart Failure Paternal Grandfather   . Cancer Cousin 59       unknown  :   Social History   Socioeconomic History  . Marital status: Divorced    Spouse name: Not on file  . Number of children: 3  . Years of education: 12+6+60 hours  . Highest education level: Master's degree (e.g., MA, MS, MEng, MEd, MSW, MBA)  Occupational History    Comment: RETIRED  Tobacco Use  . Smoking status: Never  . Smokeless tobacco: Never  Vaping Use  . Vaping status: Never Used  Substance and Sexual Activity  . Alcohol use: Not Currently  . Drug use: Never  . Sexual activity: Not Currently    Partners: Male    Birth control/protection: Surgical    Comment: first sexual encounter at 2, less than 5, hysterectomy  Other Topics Concern  . Not on file  Social History Narrative  . Not on file   Social Drivers of Health   Financial Resource Strain: Not on file  Food Insecurity: No Food Insecurity (06/22/2023)   Hunger Vital Sign   . Worried About Programme researcher, broadcasting/film/video in the  Last Year: Never true   . Ran Out of Food in the Last Year: Never true  Transportation Needs: No Transportation Needs (06/22/2023)   PRAPARE - Transportation   . Lack of Transportation (Medical): No   . Lack of Transportation (Non-Medical): No  Physical Activity: Not on file  Stress: Not on file  Social Connections: Unknown (05/17/2021)   Received from [facility]   Social Network   .  Social Network: Not on file  Intimate Partner Violence: Not At Risk (06/22/2023)   Humiliation, Afraid, Rape, and Kick questionnaire   . Fear of Current or Ex-Partner: No   . Emotionally Abused: No   . Physically Abused: No   . Sexually Abused: No  :  Review of Systems  Constitutional: Negative.   HENT: Negative.    Eyes: Negative.   Respiratory: Negative.    Cardiovascular: Negative.   Gastrointestinal: Negative.   Genitourinary: Negative.   Musculoskeletal: Negative.   Skin: Negative.   Neurological: Negative.   Endo/Heme/Allergies: Negative.   Psychiatric/Behavioral: Negative.      Vitals:   07/26/23 0911  BP: 127/63  Pulse: 76  Resp: 18  Temp: 98.4 F (36.9 C)  SpO2: 98%     Physical Exam Vitals reviewed.  HENT:     Head: Normocephalic and atraumatic.  Eyes:     Pupils: Pupils are equal, round, and reactive to light.  Cardiovascular:     Rate and Rhythm: Normal rate and regular rhythm.     Pulses: Normal pulses.     Heart sounds: Normal heart sounds.  Pulmonary:     Effort: Pulmonary effort is normal.     Breath sounds: Normal breath sounds.  Abdominal:     General: Bowel sounds are normal.     Palpations: Abdomen is soft.  Musculoskeletal:        General: No swelling, tenderness, deformity or signs of injury. Normal range of motion.     Cervical back: Normal range of motion.     Right lower leg: No edema.     Left lower leg: No edema.     Comments: Negative Homans sign of right leg   Lymphadenopathy:     Cervical: No cervical adenopathy.  Skin:    General: Skin  is warm and dry.     Findings: No bruising, erythema, lesion or rash.  Neurological:     Mental Status: She is alert and oriented to person, place, and time.  Psychiatric:        Behavior: Behavior normal.        Thought Content: Thought content normal.        Judgment: Judgment normal.     No diagnosis found.   Assessment and Plan: Ms. Sonntag is a very nice 73 year old white female. She has a fairly active form of hemochromatosis.  Today her concern if for a potential DVT.  Fortunately her symptoms have resolved and there is no evidence of DVT on exam. She does not have any evidence of PE or stroke based off of vitals, history, exam.  We discussed red flag signs and symptoms to be on alert for and if they should occur she should call us  so we can place an order for a doppler scan or CTA.   For now she will continue her regimen for her hereditary hemochromatosis.

## 2023-07-26 NOTE — Progress Notes (Signed)
 Hematology and Oncology Follow Up Visit  Yolanda Vargas [id number] 1950-05-26 73 y.o. 07/26/2023   Principle Diagnosis:  Hereditary hemochromatosis (C282Y homozygous)  Current Therapy:   Phlebotomy to keep ferritin less than 50 and iron saturation less than 30%     Interim History:  Yolanda Vargas is back for follow-up.  Unfortunately, she has a litany of issues.  I cannot imagine that any of these are really related to her having hemochromatosis.  The hemochromatosis seems to be under fairly decent control.  Again she has a lot of joint issues.  She has neurological issues.  She has skin issues.  She has possibly bleeding issues.  Think she has had a carpal tunnel.  I think she had nerve conduction studies.  Are not sure exactly what to make of all of the problems.  Today, her iron saturation is only 20%.  Her ferritin is not back yet.  She has had no problems with nausea or vomiting.  She has had no cough or shortness of breath.  She has had no obvious rashes.  Patient has had some nail changes with some subungual hemorrhaging.  I think probably would be reasonable to check her for amyloidosis.  I will give her a 24-hour urine jug to collect.  Another possibility might be porphyria.  I know that there is an association with hemochromatosis and PCT.  She has had no issues with fever.  She has had no obvious change in bowel or bladder habits.  There has been some fatigue.  Overall, I would have to say that her performance status is probably ECOG 0.  Medications:  Current Outpatient Medications:    AIRSUPRA  90-80 MCG/ACT AERO, Inhale 2 puffs into the lungs every 6 (six) hours as needed., Disp: 10.7 g, Rfl: 1   amLODipine (NORVASC) 5 MG tablet, Take 5 mg by mouth daily., Disp: , Rfl:    budesonide -formoterol  (SYMBICORT ) 160-4.5 MCG/ACT inhaler, Inhale 2 puffs into the lungs 2 (two) times daily., Disp: 10.2 g, Rfl: 6   famotidine  (PEPCID ) 20 MG tablet, Take 1 tablet once daily, Disp:  30 tablet, Rfl: 5   fluticasone  (FLONASE ) 50 MCG/ACT nasal spray, Place 1 spray into both nostrils daily., Disp: 16 g, Rfl: 1   levothyroxine (SYNTHROID) 75 MCG tablet, Take 75 mcg by mouth daily before breakfast., Disp: , Rfl:    rosuvastatin (CRESTOR) 10 MG tablet, Take 10 mg by mouth at bedtime., Disp: , Rfl:    RYALTRIS  665-25 MCG/ACT SUSP, Use two sprays in each nostril one to two times daily as directed., Disp: 29 g, Rfl: 5   sertraline (ZOLOFT) 50 MG tablet, Take 75 mg by mouth daily., Disp: , Rfl:  No current facility-administered medications for this visit.  Facility-Administered Medications Ordered in Other Visits:    0.9 %  sodium chloride  infusion, , Intravenous, Once, Lewis, Dequincy A, MD   0.9 %  sodium chloride  infusion, , Intravenous, Continuous, Yanelie Abraha, Maude SAUNDERS, MD, Stopped at 07/26/23 1110  Allergies:  Allergies  Allergen Reactions   Diltiazem Hcl Other (See Comments)    Stoke like symptoms/more like balance issues, lightheaded, feeling faint     Hydrocodone Palpitations   Penicillins Rash   Doxycycline Rash    Past Medical History, Surgical history, Social history, and Family History were reviewed and updated.  Review of Systems: Review of Systems  Constitutional: Negative.   HENT:  Negative.    Eyes: Negative.   Respiratory: Negative.    Cardiovascular:  Positive for palpitations.  Gastrointestinal: Negative.   Endocrine: Negative.   Genitourinary: Negative.    Musculoskeletal:  Positive for arthralgias and myalgias.  Skin:  Positive for itching.  Neurological: Negative.   Hematological:  Bruises/bleeds easily.  Psychiatric/Behavioral: Negative.      Physical Exam:  height is 5' 6 (1.676 m) and weight is 147 lb 12.8 oz (67 kg). Her oral temperature is 98.4 F (36.9 C). Her blood pressure is 127/63 and her pulse is 76. Her respiration is 18 and oxygen saturation is 98%.   Wt Readings from Last 3 Encounters:  07/26/23 147 lb 12.8 oz (67 kg)   07/18/23 151 lb 12.8 oz (68.9 kg)  06/01/23 154 lb (69.9 kg)    Physical Exam Vitals reviewed.  HENT:     Head: Normocephalic and atraumatic.  Eyes:     Pupils: Pupils are equal, round, and reactive to light.  Cardiovascular:     Rate and Rhythm: Normal rate and regular rhythm.     Heart sounds: Normal heart sounds.  Pulmonary:     Effort: Pulmonary effort is normal.     Breath sounds: Normal breath sounds.  Abdominal:     General: Bowel sounds are normal.     Palpations: Abdomen is soft.  Musculoskeletal:        General: No tenderness or deformity. Normal range of motion.     Cervical back: Normal range of motion.  Lymphadenopathy:     Cervical: No cervical adenopathy.  Skin:    General: Skin is warm and dry.     Findings: No erythema or rash.  Neurological:     Mental Status: She is alert and oriented to person, place, and time.  Psychiatric:        Behavior: Behavior normal.        Thought Content: Thought content normal.        Judgment: Judgment normal.      Lab Results  Component Value Date   WBC 6.2 07/26/2023   HGB 12.2 07/26/2023   HCT 37.0 07/26/2023   MCV 102.5 (H) 07/26/2023   PLT 233 07/26/2023     Chemistry      Component Value Date/Time   NA 140 07/26/2023 0847   NA 142 07/20/2023 1102   K 4.2 07/26/2023 0847   CL 104 07/26/2023 0847   CO2 24 07/26/2023 0847   BUN 16 07/26/2023 0847   BUN 16 07/20/2023 1102   CREATININE 0.74 07/26/2023 0847   CREATININE 0.84 06/22/2023 1406      Component Value Date/Time   CALCIUM 9.7 07/26/2023 0847   ALKPHOS 69 07/26/2023 0847   AST 25 07/26/2023 0847   AST 23 06/22/2023 1406   ALT 19 07/26/2023 0847   ALT 24 06/22/2023 1406   BILITOT 0.3 07/26/2023 0847   BILITOT 0.4 06/22/2023 1406      Impression and Plan: Yolanda Vargas is a very charming 73 year old white female.  She has homozygous hemochromatosis.  We have been phlebotomizing her.  I had believe that the hemochromatosis is under good  control.  Again I am not sure what is going on with all his other symptoms.  Again I would have a hard time believing this anything related to hemochromatosis.  Again she may have some notes going on.  I will have her do a couple 24-hour urine specimens.  1 is for light chains and 1 is for porphyria.  For right now, I probably will plan to get her back to see her in about  3 weeks.   Maude JONELLE Crease, MD 7/24/20251:59 PM

## 2023-07-30 ENCOUNTER — Inpatient Hospital Stay

## 2023-07-30 ENCOUNTER — Ambulatory Visit: Admitting: Orthopedic Surgery

## 2023-07-30 ENCOUNTER — Ambulatory Visit (INDEPENDENT_AMBULATORY_CARE_PROVIDER_SITE_OTHER): Payer: Self-pay

## 2023-07-30 DIAGNOSIS — M25532 Pain in left wrist: Secondary | ICD-10-CM

## 2023-07-30 DIAGNOSIS — N135 Crossing vessel and stricture of ureter without hydronephrosis: Secondary | ICD-10-CM

## 2023-07-30 DIAGNOSIS — R230 Cyanosis: Secondary | ICD-10-CM

## 2023-07-30 NOTE — Procedures (Unsigned)
 EMG & NCV Findings: Evaluation of the left median motor nerve showed prolonged distal onset latency (4.3 ms), reduced amplitude (4.7 mV), and decreased conduction velocity (Elbow-Wrist, 43 m/s).  The right median motor nerve showed prolonged distal onset latency (4.5 ms) and reduced amplitude (4.1 mV).  The right ulnar motor nerve showed prolonged distal onset latency (4.6 ms).  The left median (across palm) sensory and the right median (across palm) sensory nerves showed prolonged distal peak latency (Wrist, L3.9, R4.3 ms) and prolonged distal peak latency (Palm, L2.2, R2.4 ms).  The left ulnar sensory and the right ulnar sensory nerves showed prolonged distal peak latency (L4.0, R4.6 ms) and decreased conduction velocity (Wrist-5th Digit, L35, R30 m/s).  All remaining nerves (as indicated in the following tables) were within normal limits.  Left vs. Right side comparison data for the ulnar motor nerve indicates abnormal L-R latency difference (1.0 ms).  The ulnar sensory nerve indicates abnormal L-R latency difference (0.6 ms).  All remaining left vs. right side differences were within normal limits.    All examined muscles (as indicated in the following table) showed no evidence of electrical instability.    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 There is no significant electrodiagnostic evidence of any other focal nerve entrapment, brachial plexopathy or  cervical radiculopathy  Recommendations: 1.  Follow-up with referring physician. Clinical correlation is paramount. Continue Bracing and surgical evaluation. 2.  Continue current management of symptoms.  ___________________________ Prentice Masters FAAPMR Board Certified, American Board of Physical Medicine and Rehabilitation    Nerve Conduction Studies Anti Sensory Summary Table    Stim Site NR Peak (ms) Norm Peak (ms) P-T Amp (V) Norm P-T Amp Site1 Site2 Delta-P (ms) Dist (cm) Vel (m/s) Norm Vel (m/s)  Left Median Acr Palm Anti Sensory (2nd Digit)  30.5C  Wrist    *3.9 <3.6 46.0 >10 Wrist Palm 1.7 0.0    Palm    *2.2 <2.0 57.7         Right Median Acr Palm Anti Sensory (2nd Digit)  29.2C  Wrist    *4.3 <3.6 50.8 >10 Wrist Palm 1.9 0.0    Palm    *2.4 <2.0 41.5         Left Radial Anti Sensory (Base 1st Digit)  30.1C  Wrist    2.9 <3.1 30.5  Wrist Base 1st Digit 2.9 0.0    Right Radial Anti Sensory (Base 1st Digit)  28.3C  Wrist    2.8 <3.1 21.6  Wrist Base 1st Digit 2.8 0.0    Left Ulnar Anti Sensory (5th Digit)  30.7C  Wrist    *4.0 <3.7 32.3 >15.0 Wrist 5th Digit 4.0 14.0 *35 >38  Right Ulnar Anti Sensory (5th Digit)  28.7C  Wrist    *4.6 <3.7 40.5 >15.0 Wrist 5th Digit 4.6 14.0 *30 >38   Motor Summary Table   Stim Site NR Onset (ms) Norm Onset (ms) O-P Amp (mV) Norm O-P Amp Site1 Site2 Delta-0 (ms) Dist (cm) Vel (m/s) Norm Vel (m/s)  Left Median Motor (Abd Poll Brev)  30.2C  Wrist    *4.3 <4.2 *4.7 >5 Elbow Wrist 4.4 19.0 *43 >50  Elbow    8.7  3.9         Right Median Motor (Abd Poll Brev)  28.1C  Wrist    *4.5 <4.2 *4.1 >5 Elbow Wrist 4.0 20.0  50 >50  Elbow    8.5  3.6         Left Ulnar Motor (Abd Dig Min)  30C  Wrist    3.6 <4.2 9.2 >3 B Elbow Wrist 3.4 18.0 53 >53  B Elbow    7.0  4.5  A Elbow B Elbow 1.4 10.0 71 >53  A Elbow    8.4  4.5         Right Ulnar Motor (Abd Dig Min)  28C  Wrist    *4.6 <4.2 10.4 >3 B Elbow Wrist 3.8 20.0 53 >53  B Elbow    8.4  8.9  A Elbow B Elbow 1.4 10.0 71 >53  A Elbow    9.8  8.6          EMG   Side Muscle Nerve Root Ins Act Fibs Psw Amp Dur Poly Recrt Int Bruna Comment  Left Abd Poll Brev Median C8-T1 Nml Nml Nml Nml Nml 0 Nml Nml   Left 1stDorInt Ulnar C8-T1 Nml Nml Nml Nml Nml 0 Nml Nml   Left PronatorTeres Median C6-7 Nml Nml Nml Nml Nml 0 Nml Nml   Left Biceps Musculocut C5-6 Nml Nml Nml Nml Nml 0  Nml Nml   Left Deltoid Axillary C5-6 Nml Nml Nml Nml Nml 0 Nml Nml     Nerve Conduction Studies Anti Sensory Left/Right Comparison   Stim Site L Lat (ms) R Lat (ms) L-R Lat (ms) L Amp (V) R Amp (V) L-R Amp (%) Site1 Site2 L Vel (m/s) R Vel (m/s) L-R Vel (m/s)  Median Acr Palm Anti Sensory (2nd Digit)  30.5C  Wrist *3.9 *4.3 0.4 46.0 50.8 9.4 Wrist Palm     Palm *2.2 *2.4 0.2 57.7 41.5 28.1       Radial Anti Sensory (Base 1st Digit)  30.1C  Wrist 2.9 2.8 0.1 30.5 21.6 29.2 Wrist Base 1st Digit     Ulnar Anti Sensory (5th Digit)  30.7C  Wrist *4.0 *4.6 *0.6 32.3 40.5 20.2 Wrist 5th Digit *35 *30 5   Motor Left/Right Comparison   Stim Site L Lat (ms) R Lat (ms) L-R Lat (ms) L Amp (mV) R Amp (mV) L-R Amp (%) Site1 Site2 L Vel (m/s) R Vel (m/s) L-R Vel (m/s)  Median Motor (Abd Poll Brev)  30.2C  Wrist *4.3 *4.5 0.2 *4.7 *4.1 12.8 Elbow Wrist *43 50 7  Elbow 8.7 8.5 0.2 3.9 3.6 7.7       Ulnar Motor (Abd Dig Min)  30C  Wrist 3.6 *4.6 *1.0 9.2 10.4 11.5 B Elbow Wrist 53 53 0  B Elbow 7.0 8.4 1.4 4.5 8.9 49.4 A Elbow B Elbow 71 71 0  A Elbow 8.4 9.8 1.4 4.5 8.6 47.7          Waveforms:

## 2023-07-30 NOTE — Progress Notes (Signed)
 Yolanda Vargas - 73 y.o. female MRN [id number]  Date of birth: 1950/03/03  Office Visit Note: Visit Date: 07/30/2023 PCP: Ricky Alfrieda DASEN, DO Referred by: Sharps Pellet, MD  Subjective: No chief complaint on file.  HPI: Yolanda Vargas is a pleasant 73 y.o. female who returns today for evaluation of ongoing bilateral hand numbness and tingling that is been present now for multiple years, worsening in nature.  She states that the numbness and tingling affects the radial sided digits primarily, is associated with nocturnal symptoms as well.  She has noted increasing muscle loss in the hand as well with associated weakness.  She underwent recent electrodiagnostic testing of the bilateral upper extremities which did confirm moderate carpal tunnel syndrome bilaterally.  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  There is concern for potential amyloid deposits as well.  Pertinent ROS were reviewed with the patient and found to be negative unless otherwise specified above in HPI.     Assessment & Plan: Visit Diagnoses:  1. Pain in left wrist     Plan: Extensive discussion was had with the patient today about her ongoing bilateral carpal tunnel syndrome that is refractory to conservative care.  Patient has both clinical and electrodiagnostic evidence to confirm this diagnosis.  Her symptoms are more pronounced on the left side currently and she would like to focus on the left at this time.    Given the concern for potential amyloid deposit, at this juncture, she is indicated for left open carpal tunnel release with local tissue biopsy.  This would help with diagnosis of amyloid deposit if we take a sample of the transverse carpal ligament at this time.  Understanding all risks and benefits, patient would like to have surgery done in the form of left open carpal tunnel release under local anesthesia in order to minimize cardiac  risk.  Risks include but not limited to infection, bleeding, scarring, stiffness, nerve injury or vascular, tendon injury, risk of recurrence and need for subsequent operation were all discussed in detail.  Patient consented understanding the above.  Will move forward surgical scheduling.    Follow-up: No follow-ups on file.   Meds & Orders: No orders of the defined types were placed in this encounter.   Orders Placed This Encounter  Procedures   XR Wrist Complete Left     Procedures: No procedures performed      Clinical History: No specialty comments available.  She reports that she has never smoked. She has never used smokeless tobacco. No results for input(s): HGBA1C, LABURIC in the last 8760 hours.  Objective:   Vital Signs: There were no vitals taken for this visit.  Physical Exam  Gen: Well-appearing, in no acute distress; non-toxic CV: Regular Rate. Well-perfused. Warm.  Resp: Breathing unlabored on room air; no wheezing. Psych: Fluid speech in conversation; appropriate affect; normal thought process  Ortho Exam PHYSICAL EXAM:  General: Patient is well appearing and in no distress.   Skin and Muscle: No significant skin changes are apparent to upper extremities.   Range of Motion and Palpation Tests: Mobility is full about the elbows with flexion and extension. Forearm supination and pronation are 85/85 bilaterally.  Wrist flexion/extension is 75/65 bilaterally.  Digital flexion and extension are full.  Thumb opposition is full to the base of the small fingers bilaterally.    Neurologic, Vascular, Motor: Sensation is diminished to light touch in the bilateral  median nerve distribution.  Tinel's testing positive bilateral carpal tunnel Phalen's bilateral right, Derkan's compression positive bilateral Thenar atrophy: Moderate bilaterally APB: 4/5 bilateral  Fingers pink and well perfused.  Capillary refill is brisk.     No results found for:  HGBA1C   Imaging: XR Wrist Complete Left Result Date: 07/30/2023 X-rays of the left wrist demonstrate notable degenerative change at the thumb [facility] interval with joint space narrowing, osteophyte formation and subchondral sclerosis.   Past Medical/Family/Surgical/Social History: Medications & Allergies reviewed per EMR, new medications updated. Patient Active Problem List   Diagnosis Date Noted   Genetic testing 05/29/2023   Abnormal Pap smear of cervix    Anxiety    Asthma    Bluish skin    Depression    Family history of heart disease    Female bladder prolapse    GERD (gastroesophageal reflux disease)    Hereditary hemochromatosis (HCC)    Hypertension    Insulin resistance    Left groin pain    Lyme disease    NAFL (nonalcoholic fatty liver)    Peripheral edema    Raynaud's phenomenon without gangrene    Right groin pain    Dyspnea on exertion    Thyroid  goiter    Vaginal prolapse    Family history of breast cancer    Family history of ovarian cancer    Family history of bladder cancer    Adenomatous polyps 03/22/2023   Gastritis 03/22/2023   Hemochromatosis 01/08/2023   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   Recurrent UTI 08/17/2022   Kidney stone 07/20/2022   Excessive vitamin B12 intake 06/02/2021   Osteopenia 09/11/2017   Generalized anxiety disorder 03/05/2017   Gastroesophageal reflux disease without esophagitis 12/04/2016   Benign hypertension 04/26/2016   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   Moderate persistent asthma 08/31/2015   Fatty (change of) liver, not elsewhere classified 08/30/2015   Abnormal mammogram 04/12/2015   Arthropathy 09/29/2014   Hypothyroidism 02/24/2014   Seasonal allergic rhinitis 02/24/2014   Raynaud's phenomenon 12/30/2008   Dyspepsia 03/25/1997   Past Medical History:  Diagnosis Date   Abnormal mammogram 04/12/2015   Removal Reason: resolved     Abnormal Pap smear of cervix    Adenomatous  polyps 03/22/2023   In colon     Anxiety    Arthropathy 09/29/2014   Asthma    Benign hypertension 04/26/2016   Bluish skin    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   Surgery had to be postponed     Depression    Dyspepsia 03/25/1997   One of my most prominent health issues, diagnosed at this time     Excessive vitamin B12 intake 06/02/2021   Lab reports show excessive Vitamin B12     Family history of bladder cancer    Family history of breast cancer    Family history of heart disease    Family history of ovarian cancer    Fatty (change of) liver, not elsewhere classified 08/30/2015   Female bladder prolapse    Gastritis 03/22/2023   First detected, during EGD     Gastroesophageal reflux disease without esophagitis 12/04/2016   Generalized anxiety disorder 03/05/2017   GERD (gastroesophageal reflux disease)    Hemochromatosis 01/08/2023   Hereditary hemochromatosis (HCC)    Hypertension    Hypothyroidism    Insulin resistance    Kidney stone 07/20/2022   Punctuate in size, right kidney     Left groin pain  Lyme disease    around 2009   Moderate persistent asthma 08/31/2015   Francis     NAFL (nonalcoholic fatty liver)    Osteopenia    Peripheral edema    Raynaud's phenomenon 12/30/2008   Raynaud's phenomenon without gangrene    Recurrent UTI 08/17/2022   Right groin pain    Seasonal allergic rhinitis 02/24/2014   Shortness of breath    Thyroid  goiter    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     Vaginal prolapse    Family History  Problem Relation Age of Onset   Breast cancer Mother 42       bilateral   Diabetes Father    Heart attack Father    Coronary artery disease Father    Ovarian cancer Sister 20   Bladder Cancer Sister 68   Heart disease Brother    Hypertension Brother    Cancer Maternal Aunt        Eye   Esophageal cancer Maternal Aunt    Polycythemia Maternal Uncle    Lung cancer Paternal Aunt 7   Pneumonia  Maternal Grandmother    Congestive Heart Failure Paternal Grandfather    Cancer Cousin 82       unknown   Past Surgical History:  Procedure Laterality Date   BREAST BIOPSY Left    2018   CERVICAL CONE BIOPSY     CHOLECYSTECTOMY     COLONOSCOPY     ESOPHAGOGASTRODUODENOSCOPY     KNEE SURGERY     Bursa removed   LAPAROSCOPIC HYSTERECTOMY  2020   Social History   Occupational History    Comment: RETIRED  Tobacco Use   Smoking status: Never   Smokeless tobacco: Never  Vaping Use   Vaping status: Never Used  Substance and Sexual Activity   Alcohol use: Not Currently   Drug use: Never   Sexual activity: Not Currently    Partners: Male    Birth control/protection: Surgical    Comment: first sexual encounter at 8, less than 5, hysterectomy    Roslyn Else Estela) Yuridiana Formanek, M.D. [facility] 6:58 PM

## 2023-07-31 ENCOUNTER — Encounter: Payer: Self-pay | Admitting: Neurology

## 2023-07-31 ENCOUNTER — Encounter: Payer: Self-pay | Admitting: Physical Medicine and Rehabilitation

## 2023-07-31 DIAGNOSIS — M3500 Sicca syndrome, unspecified: Secondary | ICD-10-CM | POA: Diagnosis not present

## 2023-07-31 DIAGNOSIS — J3089 Other allergic rhinitis: Secondary | ICD-10-CM | POA: Diagnosis not present

## 2023-07-31 DIAGNOSIS — J4540 Moderate persistent asthma, uncomplicated: Secondary | ICD-10-CM | POA: Diagnosis not present

## 2023-07-31 DIAGNOSIS — R519 Headache, unspecified: Secondary | ICD-10-CM | POA: Diagnosis not present

## 2023-07-31 DIAGNOSIS — F325 Major depressive disorder, single episode, in full remission: Secondary | ICD-10-CM

## 2023-07-31 HISTORY — DX: Major depressive disorder, single episode, in full remission: F32.5

## 2023-07-31 NOTE — Progress Notes (Signed)
 Yolanda Vargas - 73 y.o. female MRN [id number]  Date of birth: [birth year]  Office Visit Note: Visit Date: 07/25/2023 PCP: Ricky Alfrieda DASEN, DO Referred by: Arlinda Buster, MD  Subjective: Chief Complaint  Patient presents with   Right Hand - Numbness, Weakness, Pain   Left Hand - Pain, Numbness, Weakness   HPI: Yolanda Vargas is a 73 y.o. female who comes in today at the request of Dr. Anshul Agarwala for evaluation and management of chronic, worsening and severe pain, numbness and tingling in the Bilateral upper extremities.  Patient is Right hand dominant.  She was originally seen by her hand surgeon back in December and electrodiagnostic study evaluation was requested at that time.  She reports having a lot of issues right after that have been canceled.  She has been diagnosed with hemochromatosis and has been undergoing frequent phlebotomy for this.  She continues with ongoing workup for that.  She endorses today multiple issues with her hands bilaterally including weakness in both hands as well as progressive numbness and tingling somewhat global but more radial 3 digits.  She gets a lot of pain in the thumbs.  She gets a lot of somatic type complaints of issues with her arms and tendons etc.  She has tried medication and time without much relief.  No prior electrodiagnostic study.  No history of diabetes but does have history of thyroid  disease.  She has history of chronic musculoskeletal pain complaints along with Raynaud's syndrome.   I spent more than 30 minutes speaking face-to-face with the patient with 50% of the time in counseling and discussing coordination of care.      Review of Systems  Musculoskeletal:  Negative for joint pain.  Neurological:  Positive for tingling and focal weakness.  All other systems reviewed and are negative.  Otherwise per HPI.  Assessment & Plan: Visit Diagnoses:    ICD-10-CM   1. Paresthesia of skin  R20.2 NCV with EMG (electromyography)    2.  Pain in left hand  M79.642     3. Pain in right hand  M79.641     4. Weakness of both hands  R29.898     5. Pain in left wrist  M25.532     6. Pain in right wrist  M25.531        Plan: Impression: Multiple somatic complaints in both hands with likely clinical scenario of osteoarthritic changes and underlying carpal tunnel syndrome.  Electrodiagnostic study performed today.  The above electrodiagnostic study is ABNORMAL and reveals evidence of a moderate bilateral median nerve entrapment at the wrist (carpal tunnel syndrome) affecting sensory and motor components. There is no significant electrodiagnostic evidence of any other focal nerve entrapment, brachial plexopathy or  cervical radiculopathy  Recommendations: 1.  Follow-up with referring physician. Clinical correlation is paramount. Continue Bracing and surgical evaluation. 2.  Continue current management of symptoms.  Meds & Orders: No orders of the defined types were placed in this encounter.   Orders Placed This Encounter  Procedures   NCV with EMG (electromyography)    Follow-up: Return for Anshul Agarwala, MD.   Procedures: No procedures performed  EMG & NCV Findings: Evaluation of the left median motor nerve showed prolonged distal onset latency (4.3 ms), reduced amplitude (4.7 mV), and decreased conduction velocity (Elbow-Wrist, 43 m/s).  The right median motor nerve showed prolonged distal onset latency (4.5 ms) and reduced amplitude (4.1 mV).  The right ulnar motor nerve showed prolonged distal onset latency (4.6  ms).  The left median (across palm) sensory and the right median (across palm) sensory nerves showed prolonged distal peak latency (Wrist, L3.9, R4.3 ms) and prolonged distal peak latency (Palm, L2.2, R2.4 ms).  The left ulnar sensory and the right ulnar sensory nerves showed prolonged distal peak latency (L4.0, R4.6 ms) and decreased conduction velocity (Wrist-5th Digit, L35, R30 m/s).  All remaining nerves (as  indicated in the following tables) were within normal limits.  Left vs. Right side comparison data for the ulnar motor nerve indicates abnormal L-R latency difference (1.0 ms).  The ulnar sensory nerve indicates abnormal L-R latency difference (0.6 ms).  All remaining left vs. right side differences were within normal limits.    All examined muscles (as indicated in the following table) showed no evidence of electrical instability.    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 There is no significant electrodiagnostic evidence of any other focal nerve entrapment, brachial plexopathy or  cervical radiculopathy  Recommendations: 1.  Follow-up with referring physician. Clinical correlation is paramount. Continue Bracing and surgical evaluation. 2.  Continue current management of symptoms.  ___________________________ Prentice Masters FAAPMR Board Certified, American Board of Physical Medicine and Rehabilitation    Nerve Conduction Studies Anti Sensory Summary Table   Stim Site NR Peak (ms) Norm Peak (ms) P-T Amp (V) Norm P-T Amp Site1 Site2 Delta-P (ms) Dist (cm) Vel (m/s) Norm Vel (m/s)  Left Median Acr Palm Anti Sensory (2nd Digit)  30.5C  Wrist    *3.9 <3.6 46.0 >10 Wrist Palm 1.7 0.0    Palm    *2.2 <2.0 57.7         Right Median Acr Palm Anti Sensory (2nd Digit)  29.2C  Wrist    *4.3 <3.6 50.8 >10 Wrist Palm 1.9 0.0    Palm    *2.4 <2.0 41.5         Left Radial Anti Sensory (Base 1st Digit)  30.1C  Wrist    2.9 <3.1 30.5  Wrist Base 1st Digit 2.9 0.0    Right Radial Anti Sensory (Base 1st Digit)  28.3C  Wrist    2.8 <3.1 21.6  Wrist Base 1st Digit 2.8 0.0    Left Ulnar Anti Sensory (5th Digit)  30.7C  Wrist    *4.0 <3.7 32.3 >15.0 Wrist 5th Digit 4.0 14.0 *35 >38  Right Ulnar Anti Sensory (5th Digit)  28.7C  Wrist     *4.6 <3.7 40.5 >15.0 Wrist 5th Digit 4.6 14.0 *30 >38   Motor Summary Table   Stim Site NR Onset (ms) Norm Onset (ms) O-P Amp (mV) Norm O-P Amp Site1 Site2 Delta-0 (ms) Dist (cm) Vel (m/s) Norm Vel (m/s)  Left Median Motor (Abd Poll Brev)  30.2C  Wrist    *4.3 <4.2 *4.7 >5 Elbow Wrist 4.4 19.0 *43 >50  Elbow    8.7  3.9         Right Median Motor (Abd Poll Brev)  28.1C  Wrist    *4.5 <4.2 *4.1 >5 Elbow Wrist 4.0 20.0 50 >50  Elbow    8.5  3.6         Left Ulnar Motor (Abd Dig Min)  30C  Wrist    3.6 <4.2 9.2 >3 B Elbow Wrist 3.4 18.0 53 >53  B Elbow    7.0  4.5  A Elbow B Elbow 1.4 10.0  71 >53  A Elbow    8.4  4.5         Right Ulnar Motor (Abd Dig Min)  28C  Wrist    *4.6 <4.2 10.4 >3 B Elbow Wrist 3.8 20.0 53 >53  B Elbow    8.4  8.9  A Elbow B Elbow 1.4 10.0 71 >53  A Elbow    9.8  8.6          EMG   Side Muscle Nerve Root Ins Act Fibs Psw Amp Dur Poly Recrt Int Bruna Comment  Left Abd Poll Brev Median C8-T1 Nml Nml Nml Nml Nml 0 Nml Nml   Left 1stDorInt Ulnar C8-T1 Nml Nml Nml Nml Nml 0 Nml Nml   Left PronatorTeres Median C6-7 Nml Nml Nml Nml Nml 0 Nml Nml   Left Biceps Musculocut C5-6 Nml Nml Nml Nml Nml 0 Nml Nml   Left Deltoid Axillary C5-6 Nml Nml Nml Nml Nml 0 Nml Nml     Nerve Conduction Studies Anti Sensory Left/Right Comparison   Stim Site L Lat (ms) R Lat (ms) L-R Lat (ms) L Amp (V) R Amp (V) L-R Amp (%) Site1 Site2 L Vel (m/s) R Vel (m/s) L-R Vel (m/s)  Median Acr Palm Anti Sensory (2nd Digit)  30.5C  Wrist *3.9 *4.3 0.4 46.0 50.8 9.4 Wrist Palm     Palm *2.2 *2.4 0.2 57.7 41.5 28.1       Radial Anti Sensory (Base 1st Digit)  30.1C  Wrist 2.9 2.8 0.1 30.5 21.6 29.2 Wrist Base 1st Digit     Ulnar Anti Sensory (5th Digit)  30.7C  Wrist *4.0 *4.6 *0.6 32.3 40.5 20.2 Wrist 5th Digit *35 *30 5   Motor Left/Right Comparison   Stim Site L Lat (ms) R Lat (ms) L-R Lat (ms) L Amp (mV) R Amp (mV) L-R Amp (%) Site1 Site2 L Vel (m/s) R Vel (m/s) L-R Vel (m/s)   Median Motor (Abd Poll Brev)  30.2C  Wrist *4.3 *4.5 0.2 *4.7 *4.1 12.8 Elbow Wrist *43 50 7  Elbow 8.7 8.5 0.2 3.9 3.6 7.7       Ulnar Motor (Abd Dig Min)  30C  Wrist 3.6 *4.6 *1.0 9.2 10.4 11.5 B Elbow Wrist 53 53 0  B Elbow 7.0 8.4 1.4 4.5 8.9 49.4 A Elbow B Elbow 71 71 0  A Elbow 8.4 9.8 1.4 4.5 8.6 47.7          Waveforms:                     Clinical History: No specialty comments available.   She reports that she has never smoked. She has never used smokeless tobacco. No results for input(s): HGBA1C, LABURIC in the last 8760 hours.  Objective:  VS:  HT:    WT:   BMI:     BP:   HR: bpm  TEMP: ( )  RESP:  Physical Exam Vitals and nursing note reviewed.  Constitutional:      General: She is not in acute distress.    Appearance: Normal appearance. She is well-developed. She is not ill-appearing.  HENT:     Head: Normocephalic and atraumatic.  Eyes:     Conjunctiva/sclera: Conjunctivae normal.     Pupils: Pupils are equal, round, and reactive to light.  Cardiovascular:     Rate and Rhythm: Normal rate.     Pulses: Normal pulses.  Pulmonary:     Effort: Pulmonary effort is normal.  Musculoskeletal:        General: Tenderness present. No swelling or deformity.     Right lower leg: No edema.     Left lower leg: No edema.     Comments: Inspection reveals no atrophy of the bilateral APB or FDI or hand intrinsics. There is no swelling, color changes, allodynia or dystrophic changes. There is 5 out of 5 strength in the bilateral wrist extension, finger abduction and long finger flexion. There is intact sensation to light touch in all dermatomal and peripheral nerve distributions. There is a negative Froment's test bilaterally. There is a negative Tinel's test at the bilateral wrist and elbow. There is a equivocally positive Phalen's test bilaterally. There is a negative Hoffmann's test bilaterally.  Skin:    General: Skin is warm and dry.     Findings: No  erythema or rash.  Neurological:     General: No focal deficit present.     Mental Status: She is alert and oriented to person, place, and time.     Cranial Nerves: No cranial nerve deficit.     Sensory: No sensory deficit.     Motor: No weakness or abnormal muscle tone.     Coordination: Coordination normal.     Gait: Gait normal.  Psychiatric:        Mood and Affect: Mood normal.        Behavior: Behavior normal.     Ortho Exam  Imaging: XR Wrist Complete Left Result Date: 07/30/2023 X-rays of the left wrist demonstrate notable degenerative change at the thumb [facility] interval with joint space narrowing, osteophyte formation and subchondral sclerosis.   Past Medical/Family/Surgical/Social History: Medications & Allergies reviewed per EMR, new medications updated. Patient Active Problem List   Diagnosis Date Noted   Major depressive disorder, single episode, in full remission (HCC) 07/31/2023   Genetic testing 05/29/2023   Abnormal Pap smear of cervix    Anxiety    Asthma    Bluish skin    Depression    Family history of heart disease    Female bladder prolapse    GERD (gastroesophageal reflux disease)    Hereditary hemochromatosis (HCC)    Hypertension    Insulin resistance    Left groin pain    Lyme disease    NAFL (nonalcoholic fatty liver)    Peripheral edema    Raynaud's phenomenon without gangrene    Right groin pain    Dyspnea on exertion    Thyroid  goiter    Vaginal prolapse    Family history of breast cancer    Family history of ovarian cancer    Family history of bladder cancer    Adenomatous polyps 03/22/2023   Gastritis 03/22/2023   Hemochromatosis 01/08/2023   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   Recurrent UTI 08/17/2022   Kidney stone 07/20/2022   Excessive vitamin B12 intake 06/02/2021   Osteopenia 09/11/2017   Generalized anxiety disorder 03/05/2017   Gastroesophageal reflux disease without esophagitis 12/04/2016   Benign  hypertension 04/26/2016   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   Moderate persistent asthma 08/31/2015   Fatty (change of) liver, not elsewhere classified 08/30/2015   Abnormal mammogram 04/12/2015   Arthropathy 09/29/2014   Hypothyroidism 02/24/2014   Seasonal allergic rhinitis 02/24/2014   Raynaud's phenomenon 12/30/2008   Dyspepsia 03/25/1997   Past Medical History:  Diagnosis Date   Abnormal mammogram 04/12/2015   Removal Reason: resolved     Abnormal Pap smear of cervix  Adenomatous polyps 03/22/2023   In colon     Anxiety    Arthropathy 09/29/2014   Asthma    Benign hypertension 04/26/2016   Bluish skin    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   Surgery had to be postponed     Depression    Dyspepsia 03/25/1997   One of my most prominent health issues, diagnosed at this time     Excessive vitamin B12 intake 06/02/2021   Lab reports show excessive Vitamin B12     Family history of bladder cancer    Family history of breast cancer    Family history of heart disease    Family history of ovarian cancer    Fatty (change of) liver, not elsewhere classified 08/30/2015   Female bladder prolapse    Gastritis 03/22/2023   First detected, during EGD     Gastroesophageal reflux disease without esophagitis 12/04/2016   Generalized anxiety disorder 03/05/2017   GERD (gastroesophageal reflux disease)    Hemochromatosis 01/08/2023   Hereditary hemochromatosis (HCC)    Hypertension    Hypothyroidism    Insulin resistance    Kidney stone 07/20/2022   Punctuate in size, right kidney     Left groin pain    Lyme disease    around 2009   Moderate persistent asthma 08/31/2015   Francis     NAFL (nonalcoholic fatty liver)    Osteopenia    Peripheral edema    Raynaud's phenomenon 12/30/2008   Raynaud's phenomenon without gangrene    Recurrent UTI 08/17/2022   Right groin pain    Seasonal allergic rhinitis 02/24/2014   Shortness of breath    Thyroid   goiter    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     Vaginal prolapse    Family History  Problem Relation Age of Onset   Breast cancer Mother 52       bilateral   Diabetes Father    Heart attack Father    Coronary artery disease Father    Ovarian cancer Sister 28   Bladder Cancer Sister 18   Heart disease Brother    Hypertension Brother    Cancer Maternal Aunt        Eye   Esophageal cancer Maternal Aunt    Polycythemia Maternal Uncle    Lung cancer Paternal Aunt 70   Pneumonia Maternal Grandmother    Congestive Heart Failure Paternal Grandfather    Cancer Cousin 73       unknown   Past Surgical History:  Procedure Laterality Date   BREAST BIOPSY Left    2018   CERVICAL CONE BIOPSY     CHOLECYSTECTOMY     COLONOSCOPY     ESOPHAGOGASTRODUODENOSCOPY     KNEE SURGERY     Bursa removed   LAPAROSCOPIC HYSTERECTOMY  2020   Social History   Occupational History    Comment: RETIRED  Tobacco Use   Smoking status: Never   Smokeless tobacco: Never  Vaping Use   Vaping status: Never Used  Substance and Sexual Activity   Alcohol use: Not Currently   Drug use: Never   Sexual activity: Not Currently    Partners: Male    Birth control/protection: Surgical    Comment: first sexual encounter at 69, less than 5, hysterectomy

## 2023-08-01 ENCOUNTER — Other Ambulatory Visit

## 2023-08-01 ENCOUNTER — Ambulatory Visit: Admitting: Neurology

## 2023-08-01 ENCOUNTER — Telehealth: Payer: Self-pay

## 2023-08-01 LAB — PORPHYRINS, FRACTIONATED URINE (TIMED COLLECTION)
Copropor(CP)III,24hr: 13 ug/(24.h) (ref 0–74)
Coproporph(CP)I,24hr: 23 ug/(24.h) (ref 0–24)
Coproporphyrin I: 9 ug/L
Coproporphyrin III: 5 ug/L
Heptacarboxyl (7-CP): <1 ug/L
Heptacarboxyl Porphyrin, 24H Ur: <3 ug/(24.h) (ref 0–4)
Hexacarboxyl (6-CP): <1 ug/L
Hexacarboxyporphyrin: <3 ug/(24.h) — ABNORMAL HIGH (ref 0–1)
Pentacarboxyl (5-CP): <1 ug/L
Pentacarboxyporphyrin: <3 ug/(24.h) (ref 0–4)
Total Volume: 2600
Uroporphyrin, Urine: 52 ug/(24.h) — ABNORMAL HIGH (ref 0–24)
Uroporphyrins (UP): 20 ug/L

## 2023-08-01 NOTE — Telephone Encounter (Signed)
 Left message on My Chart per Dr. Karry note regarding lab results. Routed to PCP.

## 2023-08-01 NOTE — Progress Notes (Unsigned)
 Initial neurology clinic note  Reason for Evaluation: Consultation requested by Yolanda Pellet, MD for an opinion regarding numbness and tingling in her feet and arms. My final recommendations will be communicated back to the requesting physician by way of shared medical record or letter to requesting physician via [place]  mail.  HPI: This is [name], a 73 y.o. right-handed female with a medical history of HTN, hypothyroidism, HLD, hereditary hemochromatosis, asthma, osteopenia, OA, nephrolithiasis, anxiety who presents to neurology clinic with the chief complaint of numbness and tingling in her feet and arms. The patient is alone today.  Patient thinks she had Lyme disease in 1999. She was bitten by a tick and had an old rash on her left hand. She was treated with doxycycline. Over the next few weeks, she gradually started having SOB then started feeling like her entire body was on fire. She went to urgent care and ESR was normal and no other issues found. She was very fatigued and nauseated. With all the testing over 3 years, there was concern about her gallbladder so that was taken out. She would have flares over the years.  About 3 years ago, she started having brain fog. She has a heaviness in her head like it is stuffed with cotton. She has episodes of confusion.   She was diagnosed with hereditary hemochromatosis by genetic panel in 12/2022. The concern for this came from a cousin with this and abnormal CBC. She had asked for this testing and was sent to hematology. She gives blood for this weekly. She was having lightheadedness around this time. She stopped giving blood for 3 weeks, but symptoms persisted. She was having more SOB, and while she has asthma, this was different.   She first noticed paresthesia in her feet about 1-1.5 years ago, particularly in the left big toe. This was thought to be due to bunions. She now has some tingling in both feet. She has cramps in her calves  and occasionally in her arms. She notices tingling and shocking in her arms since ~04/2023. She will have twitching of her fingers occasionally. She mentions being told she had CTS and was talking to ortho about release. 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  She has SOB on exertion and had a mildly abnormal echo recently (grade I diastolic dysfunction). She will have lightheadedness when standing.  She endorses GI symptoms. She has a history of GERD. She will have some bloating after eating. She was put on pepcid  for this. She endorses constipation. She urinates a lot but also endorses drinking a lot of water.  She has morning headaches that are currently being worked up as possible sleep apnea.  She does not report any constitutional symptoms like fever, night sweats, anorexia or unintentional weight loss. She has lost 15 lbs since 04/2023, but that is likely due to diet changes per patient. She is not sure if this is normal or not.  EtOH use: None  Family history of neuropathy/myopathy/neurologic disease? Brother has neuropathy (?alcohol related), son has neuropathy (3-4 back surgeries)  Patient is concerned about amyloidosis. She reads a lot about genetics.   MEDICATIONS:  Outpatient Encounter Medications as of 08/03/2023  Medication Sig   AIRSUPRA  90-80 MCG/ACT AERO Inhale 2 puffs into the lungs every 6 (six) hours as needed.   amLODipine (NORVASC) 5 MG tablet Take 5 mg by mouth daily.  budesonide -formoterol  (SYMBICORT ) 160-4.5 MCG/ACT inhaler Inhale 2 puffs into the lungs 2 (two) times daily.   famotidine  (PEPCID ) 20 MG tablet Take 1 tablet once daily   fluticasone  (FLONASE ) 50 MCG/ACT nasal spray Place 1 spray into both nostrils daily. (Patient taking differently: Place 1 spray into both nostrils as needed.)   levothyroxine (SYNTHROID) 75 MCG tablet Take 75 mcg by mouth daily before breakfast.    rosuvastatin (CRESTOR) 10 MG tablet Take 10 mg by mouth at bedtime.   RYALTRIS  665-25 MCG/ACT SUSP Use two sprays in each nostril one to two times daily as directed.   sertraline (ZOLOFT) 50 MG tablet Take 75 mg by mouth daily.   Facility-Administered Encounter Medications as of 08/03/2023  Medication   0.9 %  sodium chloride  infusion    PAST MEDICAL HISTORY: 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   Surgery had to be postponed     Depression    Dyspepsia 03/25/1997   One of my most prominent health issues, diagnosed at this time     Excessive vitamin B12 intake 06/02/2021   Lab reports show excessive Vitamin B12     Family history of bladder cancer    Family history of breast cancer    Family history of heart disease    Family history of ovarian cancer    Fatty (change of) liver, not elsewhere classified 08/30/2015   Female bladder prolapse    Gastritis 03/22/2023   First detected, during EGD     Gastroesophageal reflux disease without esophagitis 12/04/2016   Generalized anxiety disorder 03/05/2017   GERD (gastroesophageal reflux disease)    Hemochromatosis 01/08/2023   Hereditary hemochromatosis (HCC)    Hypertension    Hypothyroidism    Insulin resistance    Kidney stone 07/20/2022   Punctuate in size, right kidney     Left groin pain    Lyme disease    around 2009   Moderate persistent asthma 08/31/2015   Francis     NAFL (nonalcoholic fatty liver)    Osteopenia    Peripheral edema    Raynaud's phenomenon 12/30/2008   Raynaud's phenomenon without gangrene    Recurrent UTI 08/17/2022   Right groin pain    Seasonal allergic rhinitis 02/24/2014   Shortness of breath    Thyroid   goiter    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     Vaginal prolapse     PAST SURGICAL HISTORY: Past Surgical History:  Procedure Laterality Date   BREAST BIOPSY Left    2018   CERVICAL CONE BIOPSY     CHOLECYSTECTOMY     COLONOSCOPY     ESOPHAGOGASTRODUODENOSCOPY     KNEE SURGERY     Bursa removed   LAPAROSCOPIC HYSTERECTOMY  2020    ALLERGIES: Allergies  Allergen Reactions   Diltiazem Hcl Other (See Comments)    Stoke like symptoms/more like balance issues, lightheaded, feeling faint     Hydrocodone Palpitations   Penicillins Rash   Doxycycline Rash    FAMILY HISTORY: Family History  Problem Relation Age of Onset   Breast cancer Mother 31  bilateral   Diabetes Father    Heart attack Father    Coronary artery disease Father    Ovarian cancer Sister 55   Bladder Cancer Sister 41   Heart disease Brother    Hypertension Brother    Cancer Maternal Aunt        Eye   Esophageal cancer Maternal Aunt    Polycythemia Maternal Uncle    Lung cancer Paternal Aunt 59   Pneumonia Maternal Grandmother    Congestive Heart Failure Paternal Grandfather    Cancer Cousin 59       unknown    SOCIAL HISTORY: Social History   Tobacco Use   Smoking status: Never   Smokeless tobacco: Never  Vaping Use   Vaping status: Never Used  Substance Use Topics   Alcohol use: Not Currently   Drug use: Never   Social History   Social History Narrative   Are you right handed or left handed? right   Are you currently employed ?    What is your current occupation? retire   Do you live at home alone?   Who lives with you? Mother in law sweet    What type of home do you live in: 1 story or 2 story? one    Caffiene rarely     OBJECTIVE: PHYSICAL EXAM: BP 130/73   Pulse 85   Ht 5' 6 (1.676 m)   Wt 146 lb (66.2 kg)   SpO2 98%   BMI 23.57 kg/m   General: General appearance: Awake and alert. No distress. Cooperative with exam.  Skin: No  obvious rash or jaundice. HEENT: Atraumatic. Anicteric. Lungs: Non-labored breathing on room air  Heart: Regular Extremities: No edema. Psych: Affect appropriate.  Neurological: Mental Status: Alert. Speech fluent. No pseudobulbar affect Cranial Nerves: CNII: No RAPD. Visual fields grossly intact. CNIII, IV, VI: PERRL. No nystagmus. EOMI. CN V: Facial sensation intact bilaterally to fine touch. CN VII: Facial muscles symmetric and strong. No ptosis at rest. CN VIII: Hearing grossly intact bilaterally. CN IX: No hypophonia. CN X: Palate elevates symmetrically. CN XI: Full strength shoulder shrug bilaterally. CN XII: Tongue protrusion full and midline. No atrophy or fasciculations. No significant dysarthria Motor: Tone is normal. No fasciculations in extremities. Bilateral APB atrophy.  Individual muscle group testing (MRC grade out of 5):  Movement     Neck flexion 5    Neck extension 5     Right Left   Shoulder abduction 5 5   Elbow flexion 5 5   Elbow extension 5 5   Finger abduction - FDI 5 5   Finger abduction - ADM 5 5   Finger extension 5 5   Finger distal flexion - 2/3 5 5    Finger distal flexion - 4/5 5 5    Thumb flexion - FPL 5 5   Thumb abduction - APB 5- 4+    Hip flexion 5 5   Hip extension 5 5   Hip adduction 5 5   Hip abduction 5 5   Knee extension 5 5   Knee flexion 5 5   Dorsiflexion 5 5   Plantarflexion 5 5    Reflexes:  Right Left   Bicep 2+ 2+   Tricep 2+ 2+   BrRad 2+ 2+   Knee 2+ 2+   Ankle 1+ 1+    Pathological Reflexes: Babinski: flexor response bilaterally Hoffman: absent bilaterally Troemner: absent bilaterally Sensation: Pinprick: Intact in all extremities Vibration: Diminished in bilateral great toes  otherwise intact Proprioception: Intact in bilateral great toes Coordination: Intact finger-to- nose-finger bilaterally. Romberg negative. Gait: Able to rise from chair with arms crossed unassisted. Normal, narrow-based gait.  Able to walk on toes and heels.  Lab and Test Review: Internal labs: 07/31/23: TSH low at 0.427  07/30/23: UPEP: kappa/lambda ratio elevated to > 1.36; no M protein Urine porphyrins: Component     Latest Ref Rng 07/30/2023  Total Volume 2,600   Uroporphyrins (UP)     Undefined ug/L 20   Uroporphyrin, Urine     0 - 24 ug/24 hr 52 (H)   Heptacarboxyl (7-CP)     Undefined ug/L <1   Heptacarboxyl Porphyrin, 24H Ur     0 - 4 ug/24 hr <3   Hexacarboxyl (6-CP)     Undefined ug/L <1   Hexacarboxyporphyrin     0 - 1 ug/24 hr <3 (H)   Pentacarboxyl (5-CP)     Undefined ug/L <1   Pentacarboxyporphyrin     0 - 4 ug/24 hr <3   Coproporphyrin I     Undefined ug/L 9   Coproporph(CP)I,24hr     0 - 24 ug/24 hr 23   Coproporphyrin III     Undefined ug/L 5   Copropor(CP)III,24hr     0 - 74 ug/24 hr 13     Legend: (H) High  07/26/23: Ferritin 108 CBC w/ diff significant for MCV 102.5 CMP unremarkable  12/20/22: Copper  wnl Vit D wnl B12: 871 MMA wnl  External labs: 06/27/23: ESR 7 CRP 0.56 RF < 10 ANA negative  Imaging/Procedures: Echocardiogram (07/03/23):  1. Left ventricular ejection fraction, by estimation, is 60 to 65%. The  left ventricle has normal function. The left ventricle has no regional  wall motion abnormalities. Left ventricular diastolic parameters are  consistent with Grade I diastolic  dysfunction (impaired relaxation). The average left ventricular global  longitudinal strain is -16.9 %. The global longitudinal strain is  abnormal.   2. Right ventricular systolic function is normal. The right ventricular  size is normal.   3. The mitral valve is normal in structure. No evidence of mitral valve  regurgitation. No evidence of mitral stenosis.   4. The aortic valve is normal in structure. Aortic valve regurgitation is  mild. No aortic stenosis is present.   5. The inferior vena cava is normal in size with greater than 50%  respiratory variability,  suggesting right atrial pressure of 3 mmHg.    EMG (07/25/23 by Dr. Eldonna): EMG & NCV Findings: Evaluation of the left median motor nerve showed prolonged distal onset latency (4.3 ms), reduced amplitude (4.7 mV), and decreased conduction velocity (Elbow-Wrist, 43 m/s).  The right median motor nerve showed prolonged distal onset latency (4.5 ms) and reduced amplitude (4.1 mV).  The right ulnar motor nerve showed prolonged distal onset latency (4.6 ms).  The left median (across palm) sensory and the right median (across palm) sensory nerves showed prolonged distal peak latency (Wrist, L3.9, R4.3 ms) and prolonged distal peak latency (Palm, L2.2, R2.4 ms).  The left ulnar sensory and the right ulnar sensory nerves showed prolonged distal peak latency (L4.0, R4.6 ms) and decreased conduction velocity (Wrist-5th Digit, L35, R30 m/s).  All remaining nerves (as indicated in the following tables) were within normal limits.  Left vs. Right side comparison data for the ulnar motor nerve indicates abnormal L-R latency difference (1.0 ms).  The ulnar sensory nerve indicates abnormal L-R latency difference (0.6 ms).  All remaining left vs. right  side differences were within normal limits.     All examined muscles (as indicated in the following table) showed no evidence of electrical instability.     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 There is no significant electrodiagnostic evidence of any other focal nerve entrapment, brachial plexopathy or  cervical radiculopathy  ASSESSMENT: SARAHJANE MATHERLY is a 73 y.o. female who presents for evaluation of numbness and tingling in feet and arms with associated possible autonomic symptoms. She has a relevant medical history of HTN, hypothyroidism, HLD, hereditary hemochromatosis, asthma, osteopenia, OA,  nephrolithiasis, anxiety. Her neurological examination is pertinent for diminished vibratory sensation in feet and weakness of bilateral APB in hands. Available diagnostic data is significant for recent EMG that was interpreted as bilateral carpal tunnel syndrome. The etiology of patient's symptoms in legs are currently unclear. There may be some peripheral neuropathy, but exam is not definitive. Her previous EMG supports my exam of bilateral CTS, however, the data tables also suggest slowing of the ulnar responses that have no comment. The accuracy of this is unclear to me. Patient is very in tune with genetics and wonders about amyloidosis, which is not clear, but reasonable to consider. I will evaluate further as below.  PLAN: -Blood work: folate, SPEP, IFE, kappa/lambda light chains -EMG: PN (L > R); check again for bilateral CTS -Will consider genetic testing for neuropathy pending the results of EMG  -Return to clinic to be determined  The impression above as well as the plan as outlined below were extensively discussed with the patient who voiced understanding. All questions were answered to their satisfaction.  The patient was counseled on pertinent fall precautions per the printed material provided today, and as noted under the Patient Instructions section below.  When available, results of the above investigations and possible further recommendations will be communicated to the patient via telephone/MyChart. Patient to call office if not contacted after expected testing turnaround time.   Total time spent reviewing records, interview, history/exam, documentation, and coordination of care on day of encounter:  75 min   Thank you for allowing me to participate in patient's care.  If I can answer any additional questions, I would be pleased to do so.  Venetia Potters, MD   CC: Ricky Alfrieda DASEN, DO [address] [place] [address]  CC: Referring provider: Sharps Pellet, MD [phone] [address] [place],  [place] [address]

## 2023-08-02 ENCOUNTER — Telehealth: Payer: Self-pay

## 2023-08-02 ENCOUNTER — Encounter: Payer: Self-pay | Admitting: Allergy and Immunology

## 2023-08-02 ENCOUNTER — Encounter

## 2023-08-02 ENCOUNTER — Ambulatory Visit: Admitting: Oncology

## 2023-08-02 ENCOUNTER — Telehealth: Payer: Self-pay | Admitting: *Deleted

## 2023-08-02 LAB — UPEP/UIFE/LIGHT CHAINS/TP, 24-HR UR
% BETA, Urine: 0 %
ALPHA 1 URINE: 0 %
Albumin, U: 100 %
Alpha 2, Urine: 0 %
Free Kappa Lt Chains,Ur: 0.94 mg/L — ABNORMAL LOW (ref 1.17–86.46)
Free Kappa/Lambda Ratio: >1.36 — ABNORMAL LOW (ref 1.83–14.26)
Free Lambda Lt Chains,Ur: <0.69 mg/L (ref 0.27–15.21)
GAMMA GLOBULIN URINE: 0 %
Total Protein, Urine-Ur/day: <120 mg/(24.h) (ref 30–150)
Total Protein, Urine: <4 mg/dL
Total Volume: 3000

## 2023-08-02 NOTE — Telephone Encounter (Signed)
 Pt viewed lab results on My Chart per Dr. Vanetta Shawl note. Routed to PCP.

## 2023-08-02 NOTE — Telephone Encounter (Signed)
 Per Dr Timmy request, called patient to let her know that the MRI down at [facility] did not show iron deposition in the spleen, pancreas or liver.  Patient appreciative of call

## 2023-08-02 NOTE — Telephone Encounter (Signed)
 Dr. Maurilio received patient's nocturnal oximetry and per Dr. Maurilio: Low O2 at night. She probably has sleep apnea. Need a sleep study (Split) at [facility] and visit to sleep pulmonologist.  Called and informed patient of Dr. Rowan message.  She is actually a current patient of Dr. Annella at [facility], so I will reach out to them to schedule appointment.  Will start working on PA for split night study as well.

## 2023-08-03 ENCOUNTER — Encounter: Payer: Self-pay | Admitting: Neurology

## 2023-08-03 ENCOUNTER — Ambulatory Visit: Admitting: Neurology

## 2023-08-03 ENCOUNTER — Other Ambulatory Visit

## 2023-08-03 VITALS — BP 130/73 | HR 85 | Ht 66.0 in | Wt 146.0 lb

## 2023-08-03 DIAGNOSIS — R202 Paresthesia of skin: Secondary | ICD-10-CM | POA: Diagnosis not present

## 2023-08-03 DIAGNOSIS — G5603 Carpal tunnel syndrome, bilateral upper limbs: Secondary | ICD-10-CM

## 2023-08-03 DIAGNOSIS — G909 Disorder of the autonomic nervous system, unspecified: Secondary | ICD-10-CM

## 2023-08-03 LAB — TSH+FREE T4
Free T4: 1.34 ng/dL (ref 0.82–1.77)
TSH: 0.427 u[IU]/mL — ABNORMAL LOW (ref 0.450–4.500)

## 2023-08-03 LAB — ALLERGENS W/TOTAL IGE AREA 2
Alternaria Alternata IgE: <0.1 kU/L
Aspergillus Fumigatus IgE: <0.1 kU/L
Bermuda Grass IgE: <0.1 kU/L
Cat Dander IgE: 0.69 kU/L — AB
Cedar, Mountain IgE: <0.1 kU/L
Cladosporium Herbarum IgE: <0.1 kU/L
Cockroach, German IgE: <0.1 kU/L
Common Silver Birch IgE: <0.1 kU/L
Cottonwood IgE: <0.1 kU/L
D Farinae IgE: <0.1 kU/L
D Pteronyssinus IgE: <0.1 kU/L
Dog Dander IgE: <0.1 kU/L
Elm, American IgE: <0.1 kU/L
IgE (Immunoglobulin E), Serum: 32 [IU]/mL (ref 6–495)
Johnson Grass IgE: <0.1 kU/L
Maple/Box Elder IgE: <0.1 kU/L
Mouse Urine IgE: <0.1 kU/L
Oak, White IgE: <0.1 kU/L
Pecan, Hickory IgE: <0.1 kU/L
Penicillium Chrysogen IgE: <0.1 kU/L
Pigweed, Rough IgE: <0.1 kU/L
Ragweed, Short IgE: 0.14 kU/L — AB
Sheep Sorrel IgE Qn: <0.1 kU/L
Timothy Grass IgE: <0.1 kU/L
White Mulberry IgE: <0.1 kU/L

## 2023-08-03 LAB — CORTISOL-AM, BLOOD: Cortisol - AM: 7.6 ug/dL (ref 6.2–19.4)

## 2023-08-03 LAB — IGG, IGA, IGM
IgA/Immunoglobulin A, Serum: 190 mg/dL (ref 64–422)
IgG (Immunoglobin G), Serum: 593 mg/dL (ref 586–1602)
IgM (Immunoglobulin M), Srm: 57 mg/dL (ref 26–217)

## 2023-08-03 LAB — THYROID PEROXIDASE ANTIBODY: Thyroperoxidase Ab SerPl-aCnc: 15 [IU]/mL (ref 0–34)

## 2023-08-03 NOTE — Patient Instructions (Signed)
 I saw you today for numbness/tingling in feet and hands. You have been previously told you have bilateral carpal tunnel syndrome, which I think seems accurate. There is a question about neuropathy and amyloidosis as well.  I would like to evaluate further with the following: -Blood work today -Nerve testing (EMG) to look for neuropathy and confirm the findings in your arms.  We will discuss next steps after your testing, which could include genetic testing.  Please let me know if you have any questions or concerns in the meantime.  The physicians and staff at [facility] are committed to providing excellent care. You may receive a survey requesting feedback about your experience at our office. We strive to receive very good responses to the survey questions. If you feel that your experience would prevent you from giving the office a very good  response, please contact our office to try to remedy the situation. We may be reached at [phone]. Thank you for taking the time out of your busy day to complete the survey.  Venetia Potters, MD  Neurology  Preventing Falls at [facility] are common, often dreaded events in the lives of older people. Aside from the obvious injuries and even death that may result, fall can cause wide-ranging consequences including loss of independence, mental decline, decreased activity and mobility. Younger people are also at risk of falling, especially those with chronic illnesses and fatigue.  Ways to reduce risk for falling Examine diet and medications. Warm foods and alcohol dilate blood vessels, which can lead to dizziness when standing. Sleep aids, antidepressants and pain medications can also increase the likelihood of a fall.  Get a vision exam. Poor vision, cataracts and glaucoma increase the chances of falling.  Check foot gear. Shoes should fit snugly and have a sturdy, nonskid sole and a broad, low heel  Participate in a physician-approved  exercise program to build and maintain muscle strength and improve balance and coordination. Programs that use ankle weights or stretch bands are excellent for muscle-strengthening. Water aerobics programs and low-impact Tai Chi programs have also been shown to improve balance and coordination.  Increase vitamin D  intake. Vitamin D  improves muscle strength and increases the amount of calcium the body is able to absorb and deposit in bones.  How to prevent falls from common hazards Floors - Remove all loose wires, cords, and throw rugs. Minimize clutter. Make sure rugs are anchored and smooth. Keep furniture in its usual place.  Chairs -- Use chairs with straight backs, armrests and firm seats. Add firm cushions to existing pieces to add height.  Bathroom - Install grab bars and non-skid tape in the tub or shower. Use a bathtub transfer bench or a shower chair with a back support Use an elevated toilet seat and/or safety rails to assist standing from a low surface. Do not use towel racks or bathroom tissue holders to help you stand.  Lighting - Make sure halls, stairways, and entrances are well-lit. Install a night light in your bathroom or hallway. Make sure there is a light switch at the top and bottom of the staircase. Turn lights on if you get up in the middle of the night. Make sure lamps or light switches are within reach of the bed if you have to get up during the night.  Kitchen - Install non-skid rubber mats near the sink and stove. Clean spills immediately. Store frequently used utensils, pots, pans between waist and eye level. This helps prevent reaching and  bending. Sit when getting things out of lower cupboards.  Living room/ Bedrooms - Place furniture with wide spaces in between, giving enough room to move around. Establish a route through the living room that gives you something to hold onto as you walk.  Stairs - Make sure treads, rails, and rugs are secure. Install a rail on both  sides of the stairs. If stairs are a threat, it might be helpful to arrange most of your activities on the lower level to reduce the number of times you must climb the stairs.  Entrances and doorways - Install metal handles on the walls adjacent to the doorknobs of all doors to make it more secure as you travel through the doorway.  Tips for maintaining balance Keep at least one hand free at all times. Try using a backpack or fanny pack to hold things rather than carrying them in your hands. Never carry objects in both hands when walking as this interferes with keeping your balance.  Attempt to swing both arms from front to back while walking. This might require a conscious effort if Parkinson's disease has diminished your movement. It will, however, help you to maintain balance and posture, and reduce fatigue.  Consciously lift your feet off of the ground when walking. Shuffling and dragging of the feet is a common culprit in losing your balance.  When trying to navigate turns, use a U technique of facing forward and making a wide turn, rather than pivoting sharply.  Try to stand with your feet shoulder-length apart. When your feet are close together for any length of time, you increase your risk of losing your balance and falling.  Do one thing at a time. Don't try to walk and accomplish another task, such as reading or looking around. The decrease in your automatic reflexes complicates motor function, so the less distraction, the better.  Do not wear rubber or gripping soled shoes, they might catch on the floor and cause tripping.  Move slowly when changing positions. Use deliberate, concentrated movements and, if needed, use a grab bar or walking aid. Count 15 seconds between each movement. For example, when rising from a seated position, wait 15 seconds after standing to begin walking.  If balance is a continuous problem, you might want to consider a walking aid such as a cane, walking  stick, or walker. Once you've mastered walking with help, you might be ready to try it on your own again.

## 2023-08-07 ENCOUNTER — Ambulatory Visit: Payer: Self-pay | Admitting: Neurology

## 2023-08-07 LAB — PROTEIN ELECTROPHORESIS, SERUM
Albumin ELP: 4.5 g/dL (ref 3.8–4.8)
Alpha 1: 0.3 g/dL (ref 0.2–0.3)
Alpha 2: 0.7 g/dL (ref 0.5–0.9)
Beta 2: 0.4 g/dL (ref 0.2–0.5)
Beta Globulin: 0.5 g/dL (ref 0.4–0.6)
Gamma Globulin: 0.6 g/dL — ABNORMAL LOW (ref 0.8–1.7)
Total Protein: 7.1 g/dL (ref 6.1–8.1)

## 2023-08-07 LAB — KAPPA/LAMBDA LIGHT CHAINS
Kappa free light chain: 13.7 mg/L (ref 3.3–19.4)
Kappa:Lambda Ratio: 1.38 (ref 0.26–1.65)
Lambda Free Lght Chn: 9.9 mg/L (ref 5.7–26.3)

## 2023-08-07 LAB — IMMUNOFIXATION ELECTROPHORESIS
IgM, Serum: 63 mg/dL (ref 50–300)
IgM, Serum: 665 mg/dL (ref 600–300)
Immunoglobulin A: 216 mg/dL (ref 70–320)
Immunoglobulin A: 665 mg/dL (ref 70–320)

## 2023-08-07 LAB — FOLATE: Folate: >24 ng/mL

## 2023-08-10 DIAGNOSIS — R0602 Shortness of breath: Secondary | ICD-10-CM | POA: Insufficient documentation

## 2023-08-13 ENCOUNTER — Encounter: Payer: Self-pay | Admitting: Cardiology

## 2023-08-13 ENCOUNTER — Ambulatory Visit: Attending: Cardiology | Admitting: Cardiology

## 2023-08-13 VITALS — BP 116/68 | HR 79 | Ht 66.0 in | Wt 146.8 lb

## 2023-08-13 DIAGNOSIS — I7300 Raynaud's syndrome without gangrene: Secondary | ICD-10-CM

## 2023-08-13 DIAGNOSIS — J4540 Moderate persistent asthma, uncomplicated: Secondary | ICD-10-CM

## 2023-08-13 DIAGNOSIS — I10 Essential (primary) hypertension: Secondary | ICD-10-CM | POA: Diagnosis not present

## 2023-08-13 NOTE — Patient Instructions (Signed)
 Medication Instructions:  Your physician recommends that you continue on your current medications as directed. Please refer to the Current Medication list given to you today.  *If you need a refill on your cardiac medications before your next appointment, please call your pharmacy*   Lab Work: None ordered If you have labs (blood work) drawn today and your tests are completely normal, you will receive your results only by: MyChart Message (if you have MyChart) OR A paper copy in the mail If you have any lab test that is abnormal or we need to change your treatment, we will call you to review the results.   Testing/Procedures:   [facility] [address] [place], [place] [address] Please take advantage of the free valet parking available at the [facility] and [employer] (Entrance C).  Proceed to the [facility] Radiology Department (First Floor) for check-in.   Magnetic resonance imaging (MRI) is a painless test that produces images of the inside of the body without using Xrays.  During an MRI, strong magnets and radio waves work together in a Data processing manager to form detailed images.   MRI images may provide more details about a medical condition than X-rays, CT scans, and ultrasounds can provide.  You may be given earphones to listen for instructions.  You may eat a light breakfast and take medications as ordered with the exception of furosemide, hydrochlorothiazide, chlorthalidone or spironolactone (or any other fluid pill). If you are undergoing a stress MRI, please avoid stimulants for 12 hr prior to test. (I.e. Caffeine, nicotine, chocolate, or antihistamine medications)  If your provider has ordered anti-anxiety medications for this test, then you will need a driver.  An IV will be inserted into one of your veins. Contrast material will be injected into your IV. It will leave your body through your urine within a day. You may be told to drink plenty of fluids to  help flush the contrast material out of your system.  You will be asked to remove all metal, including: Watch, jewelry, and other metal objects including hearing aids, hair pieces and dentures. Also wearable glucose monitoring systems (ie. Freestyle Libre and Omnipods) (Braces and fillings normally are not a problem.)   TEST WILL TAKE APPROXIMATELY 1 HOUR  PLEASE NOTIFY SCHEDULING AT LEAST 24 HOURS IN ADVANCE IF YOU ARE UNABLE TO KEEP YOUR APPOINTMENT. [phone]  For more information and frequently asked questions, please visit our website : [email or web address]  Please call the Cardiac Imaging Nurse Navigators with any questions/concerns. [phone] Office     Follow-Up: At [facility], you and your health needs are our priority.  As part of our continuing mission to provide you with exceptional heart care, we have created designated Provider Care Teams.  These Care Teams include your primary Cardiologist (physician) and Advanced Practice Providers (APPs -  Physician Assistants and Nurse Practitioners) who all work together to provide you with the care you need, when you need it.  We recommend signing up for the patient portal called MyChart.  Sign up information is provided on this After Visit Summary.  MyChart is used to connect with patients for Virtual Visits (Telemedicine).  Patients are able to view lab/test results, encounter notes, upcoming appointments, etc.  Non-urgent messages can be sent to your provider as well.   To learn more about what you can do with MyChart, go to [email or web address].    Your next appointment:   6 month(s)  The format for your next appointment:  In Person  Provider:   Jennifer Crape, MD    Other Instructions none  Important Information About Sugar

## 2023-08-13 NOTE — Progress Notes (Signed)
 Cardiology Office Note:    Date:  08/13/2023   ID:  Yolanda Vargas, DOB 1950/03/31, MRN [id number]  PCP:  Ricky Alfrieda DASEN, DO  Cardiologist:  Lamar Fitch, MD    Referring MD: Sharps Pellet, MD   No chief complaint on file.   History of Present Illness:    Yolanda Vargas is a 73 y.o. female presents to my office for follow-up she does have hemochromatosis, dyslipidemia, essential hypertension.  Concern is her shortness of breath so far workup negative echocardiogram showed preserved left ventricle ejection fraction, atrial size is normal.  She did wear a monitor which showed some supraventricular tachycardia as well as first-degree AV block.  Complains still having some shortness of breath described to have 1 episodes where it was during the day when she became dizzy swimmy headed sweaty look like vagal effect.  She ended up sitting in the car drinking something when home check blood pressure was low.  I asked her to reduce dose of amlodipine to 2.5 mg daily.  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   Surgery had to be postponed     Depression    Dyspepsia 03/25/1997   One of my most prominent health issues, diagnosed at this time     Dyspnea on exertion    Excessive vitamin B12 intake 06/02/2021   Lab reports show excessive Vitamin B12     Family history of bladder cancer    Family history of breast cancer    Family history of heart disease    Family history of ovarian cancer    Fatty (change of) liver, not elsewhere classified 08/30/2015   Female bladder prolapse    Gastritis 03/22/2023   First detected, during EGD     Gastroesophageal reflux disease without esophagitis 12/04/2016    Generalized anxiety disorder 03/05/2017   Genetic testing 05/29/2023   Negative genetic testing on the CancerNext-Expanded+RNAinsight panel.  The report date is May 25, 2023.     The CancerNext-Expanded gene panel offered by [facility] and includes sequencing, rearrangement, and RNA analysis for the following 77 genes: AIP, ALK, APC, ATM, BAP1, BARD1, BMPR1A, BRCA1, BRCA2, BRIP1, CDC73, CDH1, CDK4, CDKN1B, CDKN2A, CEBPA, CHEK2, CTNNA1, DDX41, DICER1, ETV6, FH, FL   GERD (gastroesophageal reflux disease)    Hemochromatosis 01/08/2023   Hereditary hemochromatosis (HCC)    Hypertension    Hypothyroidism    Insulin resistance    Kidney stone 07/20/2022   Punctuate in size, right kidney     Left groin pain    Lyme disease    around 2009   Major depressive disorder, single episode, in full remission (HCC) 07/31/2023   Moderate persistent asthma 08/31/2015   Francis     NAFL (nonalcoholic fatty liver)    Osteopenia    Peripheral edema    Raynaud's phenomenon 12/30/2008   Raynaud's phenomenon without gangrene    Recurrent UTI 08/17/2022   Right groin pain    Seasonal allergic rhinitis 02/24/2014   Shortness of breath    Thyroid  goiter    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   amLODipine (NORVASC) 5 MG tablet Take 5 mg by mouth daily.   budesonide -formoterol  (SYMBICORT ) 160-4.5 MCG/ACT inhaler Inhale 2 puffs into the lungs 2 (two) times daily.   famotidine  (PEPCID ) 20 MG tablet Take 1 tablet once daily   fluticasone  (FLONASE ) 50 MCG/ACT nasal spray  Place 1 spray into both nostrils daily. (Patient taking differently: Place 1 spray into both nostrils as needed.)   levothyroxine (SYNTHROID) 75 MCG tablet Take 75 mcg by mouth daily before breakfast.   rosuvastatin (CRESTOR) 10 MG tablet Take 10 mg by mouth at bedtime.   RYALTRIS  334-74 MCG/ACT SUSP Use two sprays in each nostril one to two times daily as directed.   sertraline (ZOLOFT) 50 MG tablet Take 75 mg by mouth daily.     Allergies:   Diltiazem hcl, Hydrocodone, Penicillins, and Doxycycline   Social History   Socioeconomic History   Marital status: Divorced    Spouse name: Not on file   Number of children: 3   Years of education: 12+6+60 hours   Highest education level: Master's degree (e.g., MA, MS, MEng, MEd, MSW, MBA)  Occupational History    Comment: RETIRED  Tobacco Use   Smoking status: Never   Smokeless tobacco: Never  Vaping Use   Vaping status: Never Used  Substance and Sexual Activity   Alcohol use: Not Currently   Drug use: Never   Sexual activity: Not Currently    Partners: Male    Birth control/protection: Surgical    Comment: first sexual encounter at 52, less than 5, hysterectomy  Other Topics Concern   Not on file  Social History Narrative   Are you right handed or left handed? right   Are you currently employed ?    What is your current occupation? retire   Do you live at home alone?   Who lives with you? Mother in law sweet    What type of home do you live in: 1 story or 2 story? one    Caffiene rarely   Social Drivers of Corporate investment banker Strain: Not on file  Food Insecurity: No Food Insecurity (06/22/2023)   Hunger Vital Sign    Worried About Running Out of Food in the Last Year: Never true    Ran Out of Food in the Last Year: Never true  Transportation Needs: No Transportation Needs (06/22/2023)   PRAPARE - Administrator, Civil Service (Medical): No    Lack of Transportation (Non-Medical): No  Physical Activity:  Not on file  Stress: Not on file  Social Connections: Unknown (05/17/2021)   Received from [facility]   Social Network    Social Network: Not on file     Family History: The patient's family history includes Bladder Cancer (age of onset: 40) in her sister; Breast cancer (age of onset: 60) in her mother; Cancer in her maternal aunt; Cancer (age of onset: 27) in her cousin; Congestive Heart Failure in her paternal grandfather; Coronary artery disease in her father;  Diabetes in her father; Esophageal cancer in her maternal aunt; Heart attack in her father; Heart disease in her brother; Hypertension in her brother; Lung cancer (age of onset: 69) in her paternal aunt; Ovarian cancer (age of onset: 58) in her sister; Pneumonia in her maternal grandmother; Polycythemia in her maternal uncle. ROS:   Please see the history of present illness.    All 14 point review of systems negative except as described per history of present illness  EKGs/Labs/Other Studies Reviewed:         Recent Labs: 07/20/2023: NT-Pro BNP 59 07/26/2023: ALT 19; BUN 16; Creatinine, Ser 0.74; Hemoglobin 12.2; Platelet Count 233; Potassium 4.2; Sodium 140 07/31/2023: TSH 0.427  Recent Lipid Panel No results found for: CHOL, TRIG, HDL, CHOLHDL, VLDL, LDLCALC, LDLDIRECT  Physical Exam:    VS:  BP 116/68   Pulse 79   Ht 5' 6 (1.676 m)   Wt 146 lb 12.8 oz (66.6 kg)   SpO2 95%   BMI 23.69 kg/m     Wt Readings from Last 3 Encounters:  08/13/23 146 lb 12.8 oz (66.6 kg)  08/03/23 146 lb (66.2 kg)  07/26/23 147 lb 12.8 oz (67 kg)     GEN:  Well nourished, well developed in no acute distress HEENT: Normal NECK: No JVD; No carotid bruits LYMPHATICS: No lymphadenopathy CARDIAC: RRR, no murmurs, no rubs, no gallops RESPIRATORY:  Clear to auscultation without rales, wheezing or rhonchi  ABDOMEN: Soft, non-tender, non-distended MUSCULOSKELETAL:  No edema; No deformity  SKIN: Warm and dry LOWER  EXTREMITIES: no swelling NEUROLOGIC:  Alert and oriented x 3 PSYCHIATRIC:  Normal affect   ASSESSMENT:    1. Benign hypertension   2. Hereditary hemochromatosis (HCC)   3. Raynaud's phenomenon without gangrene   4. Moderate persistent asthma without complication    PLAN:    In order of problems listed above:  Hemochromatosis question about cardiac involvement.  She does have first-degree AV block, also history of Lyme disease, I will do MRI of the heart to make sure that she does not have significant reports of thyroid  within her heart. Raynaud phenomenon.  Noted but lately is not troubled much by that. Benign essential hypertension actually blood pressure still getting [place] on the lower side will reduce amlodipine to only 2.5 daily.  She does have a question about potential amlodipine, she did have elected to remain a full resistant done which did not show much spiking from what she is telling me.  They will be followed by oncology.  Will get MRI   Medication Adjustments/Labs and Tests Ordered: Current medicines are reviewed at length with the patient today.  Concerns regarding medicines are outlined above.  No orders of the defined types were placed in this encounter.  Medication changes: No orders of the defined types were placed in this encounter.   Signed, Lamar DOROTHA Fitch, MD, [facility] 08/13/2023 1:46 PM    [facility]

## 2023-08-14 LAB — HEMOGLOBIN AND HEMATOCRIT, BLOOD
Hematocrit: 39.8 % (ref 34.0–46.6)
Hemoglobin: 13.2 g/dL (ref 11.1–15.9)

## 2023-08-14 NOTE — Telephone Encounter (Signed)
 [facility] called stating the PA that was sent for the split night polysomnography is missing information. They need documentation that provides name and DOB. This can be re-faxed to [phone]

## 2023-08-15 ENCOUNTER — Ambulatory Visit (INDEPENDENT_AMBULATORY_CARE_PROVIDER_SITE_OTHER)

## 2023-08-15 ENCOUNTER — Encounter (HOSPITAL_BASED_OUTPATIENT_CLINIC_OR_DEPARTMENT_OTHER): Payer: Self-pay

## 2023-08-15 VITALS — BP 137/75 | HR 71 | Ht 66.0 in | Wt 147.0 lb

## 2023-08-15 DIAGNOSIS — J4540 Moderate persistent asthma, uncomplicated: Secondary | ICD-10-CM | POA: Diagnosis not present

## 2023-08-15 DIAGNOSIS — F419 Anxiety disorder, unspecified: Secondary | ICD-10-CM | POA: Diagnosis not present

## 2023-08-15 DIAGNOSIS — G4719 Other hypersomnia: Secondary | ICD-10-CM | POA: Diagnosis not present

## 2023-08-15 DIAGNOSIS — R7303 Prediabetes: Secondary | ICD-10-CM | POA: Diagnosis not present

## 2023-08-15 DIAGNOSIS — Z5181 Encounter for therapeutic drug level monitoring: Secondary | ICD-10-CM | POA: Diagnosis not present

## 2023-08-15 DIAGNOSIS — Z0000 Encounter for general adult medical examination without abnormal findings: Secondary | ICD-10-CM | POA: Diagnosis not present

## 2023-08-15 DIAGNOSIS — E7800 Pure hypercholesterolemia, unspecified: Secondary | ICD-10-CM | POA: Diagnosis not present

## 2023-08-15 DIAGNOSIS — R0602 Shortness of breath: Secondary | ICD-10-CM

## 2023-08-15 DIAGNOSIS — E039 Hypothyroidism, unspecified: Secondary | ICD-10-CM | POA: Diagnosis not present

## 2023-08-15 DIAGNOSIS — N644 Mastodynia: Secondary | ICD-10-CM | POA: Diagnosis not present

## 2023-08-15 DIAGNOSIS — I10 Essential (primary) hypertension: Secondary | ICD-10-CM | POA: Diagnosis not present

## 2023-08-15 DIAGNOSIS — E538 Deficiency of other specified B group vitamins: Secondary | ICD-10-CM | POA: Diagnosis not present

## 2023-08-15 NOTE — Assessment & Plan Note (Signed)
-   This is likely multifactorial in the setting of asthma, possible sleep apnea, and bradycardia - Agree with cardiology follow-up - Notably chest CT and pulmonary function tests are within normal limits - Consider reimaging versus step up in asthma therapy pending results of workup.

## 2023-08-15 NOTE — Progress Notes (Signed)
 @Patient  ID: Yolanda Vargas, female    DOB: [birth year], 73 y.o.   MRN: [id number]  Chief Complaint  Patient presents with   Establish Care    New sleep     Referring provider: Ricky Alfrieda DASEN, DO  HPI: Yolanda Vargas is a 73 year old female with past medical history of moderate persistent asthma, seasonal allergic rhinitis, hypertension, GERD who presents today for a new consultation for evaluation of sleep.  She follows with [facility] for asthma on a regular basis and was last seen in the office in December 2024.  She reports that she has difficulty sleeping at night.  She does report that she goes to bed between 10 and 11 PM but watches TV while lying in bed.  She states that it only takes her about 10 minutes to fall asleep and she stays in bed for about 8 to 9 hours at night.  She wakes up feeling fatigued and unrested.  She also complains of morning headaches every day.  She has also been told that she snores.  She denies waking up gasping for breath.  She reports that she rarely has caffeine intake especially after 4 PM.  She is currently undergoing workup with cardiology as well.  She did have an ONO completed which demonstrated an O2 sat nadir of 82% for about 13 minutes.  This also demonstrated bradycardic events which prompted the cardiology workup.  Based on her low oxygen levels overnight she was referred here for sleep evaluation.  She denies chest pain, palpitations, fever, chills, night sweats, weight loss, cough.  She also denies dyspnea on exertion.  She does complain of daily fatigue.  TEST/EVENTS : CT for cardiac scoring completed on 02/23/2023:IMPRESSION: No significant extracardiac findings within the visualized chest.  PFTs completed on 07/18/2023: These were normal with FEV1 of 86.7% and FVC of 85.3%     Allergies  Allergen Reactions   Diltiazem Hcl Other (See Comments)    Stoke like symptoms/more like balance issues, lightheaded, feeling faint     Hydrocodone Palpitations    Penicillins Rash   Doxycycline Rash    Immunization History  Administered Date(s) Administered   Fluzone Influenza virus vaccine,trivalent (IIV3), split virus 12/15/2019, 12/11/2020, 10/16/2021, 10/24/2022   Influenza, High Dose Seasonal PF 12/04/2016   Influenza,inj,Quad PF,6+ Mos 09/16/2018   Influenza,inj,quad, With Preservative 09/02/2017   Influenza-Unspecified 10/16/2021   PFIZER(Purple Top)SARS-COV-2 Vaccination 02/08/2019, 03/01/2019, 10/02/2019   Pfizer(Comirnaty)Fall Seasonal Vaccine 12 years and older 10/02/2019, 09/13/2020, 11/14/2021   Pneumococcal Conjugate-13 05/13/2015   Pneumococcal Polysaccharide-23 05/04/2014, 09/13/2020, 10/12/2022   Respiratory Syncytial Virus Vaccine,Recomb Aduvanted(Arexvy) 12/10/2021   Td 06/30/2022   Td,absorbed, Preservative Free, Adult Use, Lf Unspecified 06/30/2022   Tdap 08/09/2009, 06/20/2019   Zoster Recombinant(Shingrix) 12/24/2017, 02/21/2018   Zoster, Live 08/13/2017, 12/24/2017   Zoster, Unspecified 08/13/2017    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   Surgery had to be postponed     Depression    Dyspepsia 03/25/1997   One of my most prominent health issues, diagnosed at this time     Dyspnea on exertion    Excessive vitamin B12 intake 06/02/2021  Lab reports show excessive Vitamin B12     Family history of bladder cancer    Family history of breast cancer    Family history of heart disease    Family history of ovarian cancer    Fatty (change of) liver, not elsewhere classified 08/30/2015   Female bladder prolapse    Gastritis 03/22/2023   First detected, during EGD     Gastroesophageal reflux disease without esophagitis 12/04/2016    Generalized anxiety disorder 03/05/2017   Genetic testing 05/29/2023   Negative genetic testing on the CancerNext-Expanded+RNAinsight panel.  The report date is May 25, 2023.     The CancerNext-Expanded gene panel offered by [facility] and includes sequencing, rearrangement, and RNA analysis for the following 77 genes: AIP, ALK, APC, ATM, BAP1, BARD1, BMPR1A, BRCA1, BRCA2, BRIP1, CDC73, CDH1, CDK4, CDKN1B, CDKN2A, CEBPA, CHEK2, CTNNA1, DDX41, DICER1, ETV6, FH, FL   GERD (gastroesophageal reflux disease)    Hemochromatosis 01/08/2023   Hereditary hemochromatosis (HCC)    Hypertension    Hypothyroidism    Insulin resistance    Kidney stone 07/20/2022   Punctuate in size, right kidney     Left groin pain    Lyme disease    around 2009   Major depressive disorder, single episode, in full remission (HCC) 07/31/2023   Moderate persistent asthma 08/31/2015   Francis     NAFL (nonalcoholic fatty liver)    Osteopenia    Peripheral edema    Raynaud's phenomenon 12/30/2008   Raynaud's phenomenon without gangrene    Recurrent UTI 08/17/2022   Right groin pain    Seasonal allergic rhinitis 02/24/2014   Shortness of breath    Thyroid  goiter    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 10.7 g 1   amLODipine (NORVASC) 5 MG tablet Take 5 mg by mouth daily.     budesonide -formoterol  (SYMBICORT ) 160-4.5 MCG/ACT inhaler Inhale 2 puffs into the lungs 2 (two) times daily. 10.2 g 6   famotidine  (PEPCID ) 20 MG tablet Take 1 tablet once daily 30 tablet 5   fluticasone  (FLONASE ) 50 MCG/ACT nasal spray Place 1 spray into both nostrils daily. (Patient taking  differently: Place 1 spray into both nostrils as needed.) 16 g 1   levothyroxine (SYNTHROID) 75 MCG tablet Take 75 mcg by mouth daily before breakfast.     rosuvastatin (CRESTOR) 10 MG tablet Take 10 mg by mouth at bedtime.     RYALTRIS  [id number] MCG/ACT SUSP Use two sprays in each nostril one to two times daily as directed. 29 g 5   sertraline (ZOLOFT) 50 MG tablet Take 75 mg by mouth daily.     Facility-Administered Medications Prior to Visit  Medication Dose Route Frequency Provider Last Rate Last Admin   0.9 %  sodium chloride  infusion   Intravenous Once Lewis, Dequincy A, MD         Review of Systems:   Constitutional:   No  weight loss, night sweats,  Fevers, chills, or  lassitude.  Positive for fatigue, snoring, headaches.  HEENT:   Positive for headaches,  No Difficulty swallowing,  Tooth/dental problems, or  Sore throat,  No sneezing, itching, ear ache, nasal congestion, post nasal drip,   CV:  No chest pain,  Orthopnea, PND, swelling in lower extremities, anasarca, dizziness, palpitations, syncope.   GI  No heartburn, indigestion, abdominal pain, nausea, vomiting, diarrhea, change in bowel habits, loss of appetite, bloody stools.   Resp: No shortness of breath with exertion or at rest.  No excess mucus, no productive cough,  No non-productive cough,  No coughing up of blood.  No change in color of mucus.  No wheezing.  No chest wall deformity  Skin: no rash or lesions.  GU: no dysuria, change in color of urine, no urgency or frequency.  No flank pain, no hematuria   MS:  No joint pain or swelling.  No decreased range of motion.  No back pain.    Physical Exam  BP 137/75 (BP Location: Left Arm)   Pulse 71   Ht 5' 6 (1.676 m)   Wt 147 lb (66.7 kg)   SpO2 100%   BMI 23.73 kg/m   GEN: A/Ox3; pleasant , NAD, well nourished    HEENT:  [place]/AT,  EACs-clear, TMs-wnl, NOSE-clear, THROAT-clear, no lesions, no postnasal drip or exudate noted.  Mallampati  2  NECK:  Supple w/ fair ROM; no JVD; normal carotid impulses w/o bruits; no thyromegaly or nodules palpated; no lymphadenopathy.    RESP  Clear  P & A; w/o, wheezes/ rales/ or rhonchi. no accessory muscle use, no dullness to percussion  CARD:  RRR, no m/r/g, no peripheral edema, pulses intact, no cyanosis or clubbing.  GI:   Soft & nt; nml bowel sounds; no organomegaly or masses detected.   Musco: Warm bil, no deformities or joint swelling noted.   Neuro: alert, no focal deficits noted.    Skin: Warm, no lesions or rashes    Lab Results:  CBC    Component Value Date/Time   WBC 6.2 07/26/2023 0847   WBC 5.8 12/06/2022 1144   RBC 3.61 (L) 07/26/2023 0847   HGB 13.2 08/13/2023 1405   HCT 39.8 08/13/2023 1405   PLT 233 07/26/2023 0847   PLT 227 12/19/2021 1142   MCV 102.5 (H) 07/26/2023 0847   MCV 98 (H) 12/19/2021 1142   MCH 33.8 07/26/2023 0847   MCHC 33.0 07/26/2023 0847   RDW 13.2 07/26/2023 0847   RDW 11.3 (L) 12/19/2021 1142   LYMPHSABS 1.4 07/26/2023 0847   LYMPHSABS 2.0 12/19/2021 1142   MONOABS 0.5 07/26/2023 0847   EOSABS 0.2 07/26/2023 0847   EOSABS 0.6 (H) 12/19/2021 1142   BASOSABS 0.1 07/26/2023 0847   BASOSABS 0.1 12/19/2021 1142    BMET    Component Value Date/Time   NA 140 07/26/2023 0847   NA 142 07/20/2023 1102   K 4.2 07/26/2023 0847   CL 104 07/26/2023 0847   CO2 24 07/26/2023 0847   GLUCOSE 94 07/26/2023 0847   BUN 16 07/26/2023 0847   BUN 16 07/20/2023 1102   CREATININE 0.74 07/26/2023 0847   CREATININE 0.84 06/22/2023 1406   CALCIUM 9.7 07/26/2023 0847   GFRNONAA >60 07/26/2023 0847   GFRNONAA >60 06/22/2023 1406    BNP No results found for: BNP  ProBNP    Component Value Date/Time   PROBNP 59 07/20/2023 1102    Imaging: XR Wrist Complete Left Result Date: 07/30/2023 X-rays of the left wrist demonstrate notable degenerative change at the thumb [facility] interval with joint space narrowing, osteophyte formation and subchondral  sclerosis.  NCV with EMG (electromyography) Result  Date: 07/25/2023 Eldonna Novel, MD     07/31/2023  6:39 AM EMG & NCV Findings: Evaluation of the left median motor nerve showed prolonged distal onset latency (4.3 ms), reduced amplitude (4.7 mV), and decreased conduction velocity (Elbow-Wrist, 43 m/s).  The right median motor nerve showed prolonged distal onset latency (4.5 ms) and reduced amplitude (4.1 mV).  The right ulnar motor nerve showed prolonged distal onset latency (4.6 ms).  The left median (across palm) sensory and the right median (across palm) sensory nerves showed prolonged distal peak latency (Wrist, L3.9, R4.3 ms) and prolonged distal peak latency (Palm, L2.2, R2.4 ms).  The left ulnar sensory and the right ulnar sensory nerves showed prolonged distal peak latency (L4.0, R4.6 ms) and decreased conduction velocity (Wrist-5th Digit, L35, R30 m/s).  All remaining nerves (as indicated in the following tables) were within normal limits.  Left vs. Right side comparison data for the ulnar motor nerve indicates abnormal L-R latency difference (1.0 ms).  The ulnar sensory nerve indicates abnormal L-R latency difference (0.6 ms).  All remaining left vs. right side differences were within normal limits.  All examined muscles (as indicated in the following table) showed no evidence of electrical instability.  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 There is no significant electrodiagnostic evidence of any other focal nerve entrapment, brachial plexopathy or  cervical radiculopathy Recommendations: 1.  Follow-up with referring physician. Clinical correlation is paramount. Continue Bracing and surgical evaluation. 2.  Continue current management of symptoms. ___________________________ Prentice Eldonna FAAPMR Board Certified, American Board of  Physical Medicine and Rehabilitation Nerve Conduction Studies Anti Sensory Summary Table  Stim Site NR Peak (ms) Norm Peak (ms) P-T Amp (V) Norm P-T Amp Site1 Site2 Delta-P (ms) Dist (cm) Vel (m/s) Norm Vel (m/s) Left Median Acr Palm Anti Sensory (2nd Digit)  30.5C Wrist    *3.9 <3.6 46.0 >10 Wrist Palm 1.7 0.0   Palm    *2.2 <2.0 57.7        Right Median Acr Palm Anti Sensory (2nd Digit)  29.2C Wrist    *4.3 <3.6 50.8 >10 Wrist Palm 1.9 0.0   Palm    *2.4 <2.0 41.5        Left Radial Anti Sensory (Base 1st Digit)  30.1C Wrist    2.9 <3.1 30.5  Wrist Base 1st Digit 2.9 0.0   Right Radial Anti Sensory (Base 1st Digit)  28.3C Wrist    2.8 <3.1 21.6  Wrist Base 1st Digit 2.8 0.0   Left Ulnar Anti Sensory (5th Digit)  30.7C Wrist    *4.0 <3.7 32.3 >15.0 Wrist 5th Digit 4.0 14.0 *35 >38 Right Ulnar Anti Sensory (5th Digit)  28.7C Wrist    *4.6 <3.7 40.5 >15.0 Wrist 5th Digit 4.6 14.0 *30 >38 Motor Summary Table  Stim Site NR Onset (ms) Norm Onset (ms) O-P Amp (mV) Norm O-P Amp Site1 Site2 Delta-0 (ms) Dist (cm) Vel (m/s) Norm Vel (m/s) Left Median Motor (Abd Poll Brev)  30.2C Wrist    *4.3 <4.2 *4.7 >5 Elbow Wrist 4.4 19.0 *43 >50 Elbow    8.7  3.9        Right Median Motor (Abd Poll Brev)  28.1C Wrist    *4.5 <4.2 *4.1 >5 Elbow Wrist 4.0 20.0 50 >50 Elbow    8.5  3.6        Left Ulnar Motor (  Abd Dig Min)  30C Wrist    3.6 <4.2 9.2 >3 B Elbow Wrist 3.4 18.0 53 >53 B Elbow    7.0  4.5  A Elbow B Elbow 1.4 10.0 71 >53 A Elbow    8.4  4.5        Right Ulnar Motor (Abd Dig Min)  28C Wrist    *4.6 <4.2 10.4 >3 B Elbow Wrist 3.8 20.0 53 >53 B Elbow    8.4  8.9  A Elbow B Elbow 1.4 10.0 71 >53 A Elbow    9.8  8.6        EMG  Side Muscle Nerve Root Ins Act Fibs Psw Amp Dur Poly Recrt Int Bruna Comment Left Abd Poll Brev Median C8-T1 Nml Nml Nml Nml Nml 0 Nml Nml  Left 1stDorInt Ulnar C8-T1 Nml Nml Nml Nml Nml 0 Nml Nml  Left PronatorTeres Median C6-7 Nml Nml Nml Nml Nml 0 Nml Nml  Left Biceps Musculocut C5-6 Nml Nml Nml  Nml Nml 0 Nml Nml  Left Deltoid Axillary C5-6 Nml Nml Nml Nml Nml 0 Nml Nml  Nerve Conduction Studies Anti Sensory Left/Right Comparison  Stim Site L Lat (ms) R Lat (ms) L-R Lat (ms) L Amp (V) R Amp (V) L-R Amp (%) Site1 Site2 L Vel (m/s) R Vel (m/s) L-R Vel (m/s) Median Acr Palm Anti Sensory (2nd Digit)  30.5C Wrist *3.9 *4.3 0.4 46.0 50.8 9.4 Wrist Palm    Palm *2.2 *2.4 0.2 57.7 41.5 28.1      Radial Anti Sensory (Base 1st Digit)  30.1C Wrist 2.9 2.8 0.1 30.5 21.6 29.2 Wrist Base 1st Digit    Ulnar Anti Sensory (5th Digit)  30.7C Wrist *4.0 *4.6 *0.6 32.3 40.5 20.2 Wrist 5th Digit *35 *30 5 Motor Left/Right Comparison  Stim Site L Lat (ms) R Lat (ms) L-R Lat (ms) L Amp (mV) R Amp (mV) L-R Amp (%) Site1 Site2 L Vel (m/s) R Vel (m/s) L-R Vel (m/s) Median Motor (Abd Poll Brev)  30.2C Wrist *4.3 *4.5 0.2 *4.7 *4.1 12.8 Elbow Wrist *43 50 7 Elbow 8.7 8.5 0.2 3.9 3.6 7.7      Ulnar Motor (Abd Dig Min)  30C Wrist 3.6 *4.6 *1.0 9.2 10.4 11.5 B Elbow Wrist 53 53 0 B Elbow 7.0 8.4 1.4 4.5 8.9 49.4 A Elbow B Elbow 71 71 0 A Elbow 8.4 9.8 1.4 4.5 8.6 47.7      Waveforms:              0.9 %  sodium chloride  infusion     Date Action Dose Route User   06/25/2023 1530 New Bag/Given (none) Intravenous Spangler, Courtney E, RN      0.9 %  sodium chloride  infusion     Date Action Dose Route User   07/10/2023 1503 New Bag/Given (none) Intravenous Gary, Harlene I, RN      0.9 %  sodium chloride  infusion     Date Action Dose Route User   07/19/2023 1340 New Bag/Given (none) Intravenous Cleotilde Dorothyann ORN      0.9 %  sodium chloride  infusion     Date Action Dose Route User   07/26/2023 1110 Rate/Dose Change (none) Intravenous Kemp Chiquita BRAVO, RN   07/26/2023 1030 New Bag/Given (none) Intravenous Kemp Chiquita BRAVO, RN          Latest Ref Rng & Units 12/08/2019    9:02 AM 09/06/2016    9:39 AM  PFT Results  FVC-Pre L  3.40    FVC-Predicted Pre % 98    FVC-Post L 3.48    FVC-Predicted Post %  100    Pre FEV1/FVC % % 75    Post FEV1/FCV % % 77    FEV1-Pre L 2.56    FEV1-Predicted Pre % 97    FEV1-Post L 2.66    DLCO uncorrected ml/min/mmHg 22.75    DLCO UNC% % 103    DLCO corrected ml/min/mmHg 22.75    DLCO COR %Predicted % 103    DLVA Predicted % 101    TLC L 5.95  1.74      TLC % Predicted % 106    RV % Predicted % 108       This result is from an external source.    No results found for: NITRICOXIDE   Assessment & Plan:   Assessment & Plan Excessive daytime sleepiness - Plan for home sleep test to rule out sleep apnea; HST ordered today. Moderate persistent asthma without complication - Doing well and with normal PFTs on Symbicort  - Continue Symbicort  twice a day - Continue albuterol  as needed Shortness of breath - This is likely multifactorial in the setting of asthma, possible sleep apnea, and bradycardia - Agree with cardiology follow-up - Notably chest CT and pulmonary function tests are within normal limits - Consider reimaging versus step up in asthma therapy pending results of workup.   Return in about 7 weeks (around 10/03/2023) for sleep study review.  Candis Dandy, PA-C 08/15/2023

## 2023-08-15 NOTE — Progress Notes (Signed)
 Epworth Sleepiness Scale  Use the following scale to choose the most appropriate number for each situation. 0 Would never nod off 1  Slight  chance of nodding off 2 Moderate chance of nodding off 3 High chance of nodding off  Sitting and reading: 0 Watching TV: 2 Sitting, inactive, in a public place (e.g., in a meeting, theater, or dinner event): 0 As a passenger in a car for an hour or more without stopping for a break: 0 Lying down to rest when circumstances permit:0 Sitting and talking to someone: 0 Sitting quietly after a meal without alcohol: 0 In a car, while stopped for a few  minutes in traffic or at a light: 0  TOTOAL: 2

## 2023-08-15 NOTE — Assessment & Plan Note (Signed)
-   Doing well and with normal PFTs on Symbicort  - Continue Symbicort  twice a day - Continue albuterol  as needed

## 2023-08-15 NOTE — Patient Instructions (Addendum)
 Home sleep study ordered today; please complete.  Follow up in office after study completed.  6-8 weeks from now.  Continue Symbicort  and Albuterol  as needed.  Return to clinic or call if new or worsening symptoms.

## 2023-08-15 NOTE — Telephone Encounter (Signed)
 Faxed information to number provided. Awaiting decision.

## 2023-08-16 ENCOUNTER — Inpatient Hospital Stay

## 2023-08-16 ENCOUNTER — Other Ambulatory Visit (HOSPITAL_BASED_OUTPATIENT_CLINIC_OR_DEPARTMENT_OTHER): Payer: Self-pay | Admitting: Family Medicine

## 2023-08-16 ENCOUNTER — Inpatient Hospital Stay: Attending: Oncology

## 2023-08-16 ENCOUNTER — Inpatient Hospital Stay: Admitting: Hematology & Oncology

## 2023-08-16 ENCOUNTER — Ambulatory Visit: Payer: Self-pay | Admitting: Hematology & Oncology

## 2023-08-16 DIAGNOSIS — N644 Mastodynia: Secondary | ICD-10-CM

## 2023-08-16 DIAGNOSIS — R230 Cyanosis: Secondary | ICD-10-CM

## 2023-08-16 DIAGNOSIS — Z79899 Other long term (current) drug therapy: Secondary | ICD-10-CM | POA: Insufficient documentation

## 2023-08-16 LAB — CBC WITH DIFFERENTIAL (CANCER CENTER ONLY)
Abs Immature Granulocytes: 0.03 K/uL (ref 0.00–0.07)
Basophils Absolute: 0.1 K/uL (ref 0.0–0.1)
Basophils Relative: 1 %
Eosinophils Absolute: 0.4 K/uL (ref 0.0–0.5)
Eosinophils Relative: 5 %
HCT: 41.4 % (ref 36.0–46.0)
Hemoglobin: 13.7 g/dL (ref 12.0–15.0)
Immature Granulocytes: 0 %
Lymphocytes Relative: 19 %
Lymphs Abs: 1.5 K/uL (ref 0.7–4.0)
MCH: 33.6 pg (ref 26.0–34.0)
MCHC: 33.1 g/dL (ref 30.0–36.0)
MCV: 101.5 fL — ABNORMAL HIGH (ref 80.0–100.0)
Monocytes Absolute: 0.6 K/uL (ref 0.1–1.0)
Monocytes Relative: 7 %
Neutro Abs: 5.4 K/uL (ref 1.7–7.7)
Neutrophils Relative %: 68 %
Platelet Count: 244 K/uL (ref 150–400)
RBC: 4.08 MIL/uL (ref 3.87–5.11)
RDW: 12.1 % (ref 11.5–15.5)
WBC Count: 7.9 K/uL (ref 4.0–10.5)
nRBC: 0 % (ref 0.0–0.2)

## 2023-08-16 LAB — CMP (CANCER CENTER ONLY)
ALT: 20 U/L (ref 0–44)
AST: 27 U/L (ref 15–41)
Albumin: 4.8 g/dL (ref 3.5–5.0)
Alkaline Phosphatase: 80 U/L (ref 38–126)
Anion gap: 14 (ref 5–15)
BUN: 18 mg/dL (ref 8–23)
CO2: 25 mmol/L (ref 22–32)
Calcium: 10.1 mg/dL (ref 8.9–10.3)
Chloride: 102 mmol/L (ref 98–111)
Creatinine: 0.73 mg/dL (ref 0.44–1.00)
GFR, Estimated: >60 mL/min (ref >60)
Glucose, Bld: 100 mg/dL — ABNORMAL HIGH (ref 70–99)
Potassium: 4.5 mmol/L (ref 3.5–5.1)
Sodium: 141 mmol/L (ref 135–145)
Total Bilirubin: 0.4 mg/dL (ref 0.0–1.2)
Total Protein: 7.6 g/dL (ref 6.5–8.1)

## 2023-08-16 LAB — IRON AND IRON BINDING CAPACITY (CC-WL,HP ONLY)
Iron: 84 ug/dL (ref 28–170)
Saturation Ratios: 21 % (ref 10.4–31.8)
TIBC: 405 ug/dL (ref 250–450)
UIBC: 321 ug/dL

## 2023-08-16 LAB — FERRITIN: Ferritin: 70 ng/mL (ref 11–307)

## 2023-08-16 NOTE — Progress Notes (Signed)
 Hematology and Oncology Follow Up Visit  Yolanda Vargas [id number] 07-20-1950 73 y.o. 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  She is feeling a little bit better.  She did have an issue where she had some lightheadedness.  She just felt quite fatigued.  This seemed to be after she was exposed to some heat.  She is going to have a cardiac MRI.  She also going to have a sleep study done.  We did do a 24-hour urine on her.  There is no abnormal protein in her urine.  We also did a 24-hour urine for porphyrins.  She had a slightly elevated uroporphyrin.  However.  I do not see any signs or symptoms that she has of  PCT.  When we last did her iron studies, everything seemed to be coming down nicely.  Her iron saturation was only 20%.  Her ferritin was 108.  She has had no issues with nausea or vomiting.  She has had no change in bowel or bladder habits.  She has had no rashes.  She says that the bleeding under the fingernails has improved.  There has been no fever.  She has had no mouth sores.  She has had no visual changes.  Overall, I would say that her performance status is probably ECOG 1.    Medications:  Current Outpatient Medications:    AIRSUPRA  90-80 MCG/ACT AERO, Inhale 2 puffs into the lungs every 6 (six) hours as needed., Disp: 10.7 g, Rfl: 1   amLODipine (NORVASC) 5 MG tablet, Take 5 mg by mouth daily., Disp: , Rfl:    budesonide -formoterol  (SYMBICORT ) 160-4.5 MCG/ACT inhaler, Inhale 2 puffs into the lungs 2 (two) times daily., Disp: 10.2 g, Rfl: 6   famotidine  (PEPCID ) 20 MG tablet, Take 1 tablet once daily, Disp: 30 tablet, Rfl: 5   fluticasone  (FLONASE ) 50 MCG/ACT nasal spray, Place 1 spray into both nostrils daily. (Patient taking differently: Place 1 spray into both nostrils as  needed.), Disp: 16 g, Rfl: 1   levothyroxine (SYNTHROID) 75 MCG tablet, Take 75 mcg by mouth daily before breakfast., Disp: , Rfl:    rosuvastatin (CRESTOR) 10 MG tablet, Take 10 mg by mouth at bedtime., Disp: , Rfl:    RYALTRIS  665-25 MCG/ACT SUSP, Use two sprays in each nostril one to two times daily as directed., Disp: 29 g, Rfl: 5   sertraline (ZOLOFT) 50 MG tablet, Take 75 mg by mouth daily., Disp: , Rfl:  No current facility-administered medications for this visit.  Facility-Administered Medications Ordered in Other Visits:    0.9 %  sodium chloride  infusion, , Intravenous, Once, Lewis, Dequincy A, MD  Allergies:  Allergies  Allergen Reactions   Diltiazem Hcl Other (See Comments)    Stoke like symptoms/more like balance issues, lightheaded, feeling faint     Hydrocodone Palpitations   Penicillins Rash   Doxycycline Rash    Past Medical History, Surgical history, Social history, and Family History were reviewed and updated.  Review of Systems: Review of Systems  Constitutional: Negative.   HENT:  Negative.    Eyes: Negative.   Respiratory: Negative.    Cardiovascular:  Positive for palpitations.  Gastrointestinal: Negative.   Endocrine: Negative.   Genitourinary: Negative.    Musculoskeletal:  Positive for arthralgias and myalgias.  Skin:  Positive for itching.  Neurological: Negative.   Hematological:  Bruises/bleeds easily.  Psychiatric/Behavioral: Negative.      Physical Exam:  height is 5' 6 (1.676 m) and weight is 146 lb 1.3 oz (66.3 kg). Her oral temperature is 98 F (36.7 C). Her blood pressure is 142/78 (abnormal) and her pulse is 84. Her respiration is 17 and oxygen saturation is 100%.   Wt Readings from Last 3 Encounters:  08/16/23 146 lb 1.3 oz (66.3 kg)  08/15/23 147 lb (66.7 kg)  08/13/23 146 lb 12.8 oz (66.6 kg)    Physical Exam Vitals reviewed.  HENT:     Head: Normocephalic and atraumatic.  Eyes:     Pupils: Pupils are equal, round, and  reactive to light.  Cardiovascular:     Rate and Rhythm: Normal rate and regular rhythm.     Heart sounds: Normal heart sounds.  Pulmonary:     Effort: Pulmonary effort is normal.     Breath sounds: Normal breath sounds.  Abdominal:     General: Bowel sounds are normal.     Palpations: Abdomen is soft.  Musculoskeletal:        General: No tenderness or deformity. Normal range of motion.     Cervical back: Normal range of motion.  Lymphadenopathy:     Cervical: No cervical adenopathy.  Skin:    General: Skin is warm and dry.     Findings: No erythema or rash.  Neurological:     Mental Status: She is alert and oriented to person, place, and time.  Psychiatric:        Behavior: Behavior normal.        Thought Content: Thought content normal.        Judgment: Judgment normal.      Lab Results  Component Value Date   WBC 7.9 08/16/2023   HGB 13.7 08/16/2023   HCT 41.4 08/16/2023   MCV 101.5 (H) 08/16/2023   PLT 244 08/16/2023     Chemistry      Component Value Date/Time   NA 141 08/16/2023 0949   NA 142 07/20/2023 1102   K 4.5 08/16/2023 0949   CL 102 08/16/2023 0949   CO2 25 08/16/2023 0949   BUN 18 08/16/2023 0949   BUN 16 07/20/2023 1102   CREATININE 0.73 08/16/2023 0949      Component Value Date/Time   CALCIUM 10.1 08/16/2023 0949   ALKPHOS 80 08/16/2023 0949   AST 27 08/16/2023 0949   ALT 20 08/16/2023 0949   BILITOT 0.4 08/16/2023 0949      Impression and Plan: Yolanda Vargas is a very charming 73 year old white female.  She has homozygous hemochromatosis.  We have been phlebotomizing her.  I had believe that the hemochromatosis is under good control.  I think we can probably hold on a phlebotomy.  I will have the iron studies back later on today.  He will be interesting to see what the cardiac MRI has to show.  He will be interested to see what the sleep study has to show.  Hopefully can we start moving her appointments out a little bit longer.  I would  like to probably see her back in about a month or so.    Maude JONELLE Crease, MD 8/14/202511:04 AM

## 2023-08-20 ENCOUNTER — Encounter (HOSPITAL_BASED_OUTPATIENT_CLINIC_OR_DEPARTMENT_OTHER): Payer: Self-pay

## 2023-08-20 ENCOUNTER — Ambulatory Visit: Admitting: Allergy and Immunology

## 2023-08-20 ENCOUNTER — Encounter: Payer: Self-pay | Admitting: Allergy and Immunology

## 2023-08-20 VITALS — BP 118/72 | HR 76 | Resp 16

## 2023-08-20 DIAGNOSIS — G43909 Migraine, unspecified, not intractable, without status migrainosus: Secondary | ICD-10-CM | POA: Diagnosis not present

## 2023-08-20 DIAGNOSIS — J4540 Moderate persistent asthma, uncomplicated: Secondary | ICD-10-CM

## 2023-08-20 DIAGNOSIS — K219 Gastro-esophageal reflux disease without esophagitis: Secondary | ICD-10-CM | POA: Diagnosis not present

## 2023-08-20 DIAGNOSIS — R519 Headache, unspecified: Secondary | ICD-10-CM

## 2023-08-20 DIAGNOSIS — G4734 Idiopathic sleep related nonobstructive alveolar hypoventilation: Secondary | ICD-10-CM | POA: Diagnosis not present

## 2023-08-20 DIAGNOSIS — I5189 Other ill-defined heart diseases: Secondary | ICD-10-CM

## 2023-08-20 DIAGNOSIS — J301 Allergic rhinitis due to pollen: Secondary | ICD-10-CM

## 2023-08-20 DIAGNOSIS — M3500 Sicca syndrome, unspecified: Secondary | ICD-10-CM | POA: Diagnosis not present

## 2023-08-20 DIAGNOSIS — J3089 Other allergic rhinitis: Secondary | ICD-10-CM

## 2023-08-20 NOTE — Patient Instructions (Addendum)
  1. Treat and prevent inflammation of airway:   A. Symbicort  160 - 2 inhalations 1-2 times per day (empty lungs)  B. Flonase  - 2 sprays each nostril 1 time per day  2. Avoid all oral antihistamines secondary to dry mouth and eyes  3. For dry eye and dry mouth can use:   A. Systane eye drops  B. Biotene mouthwash  C. Can revisit with ophthalmologist about eyedrops for dry eye  4. If needed:   A. Airsupra  - 2 inhalations every 4-6 hours  B. Nasal saline  5. Treat and prevent reflux:   A. Minimize any caffeine and chocolate and alcohol consumption  B. Famotidine  20 mg - 1 tablet 1 time per day  6. Obtain sleep study with Dr. Annella.   7. Further evaluation of headache after correction of oxygen???  8. Influenza = Tamiflu. Covid = Paxlovid  9. Return to clinic in 12 weeks or earlier if problem

## 2023-08-20 NOTE — Progress Notes (Unsigned)
 [facility] [place] [facility] [place]   Follow-up Note  Referring Provider: Ricky Alfrieda DASEN, DO Primary Provider: Ricky Alfrieda DASEN, DO Date of Office Visit: 08/20/2023  Subjective:   Yolanda Vargas (DOB: April 07, 1950) is a 73 y.o. female who [facility] in re-evaluation of the following:  HPI: Diane returns to this clinic in evaluation of asthma, allergic rhinitis, reflux, sicca syndrome, morning headache, diastolic dysfunction, brain fog.  I last saw her in this clinic 18 July 2023.  She believes that her breathing is better.  She does not use any short acting bronchodilator while she continues on Symbicort  consistently.  She believes that her nose is better.  She has been consistently using Flonase  at this point in time.  She still has very disrupted sleep and she still has bad headaches especially upon awakening in the morning.  Her reflux is much better at this point in time while utilizing her current plan.  Her mouth is not that dry since she stopped her oral antihistamine.  She still has lots of drying and burning with her eyes.  She still has really bad brain fog.  Allergies as of 08/20/2023       Reactions   Diltiazem Hcl Other (See Comments)   Stoke like symptoms/more like balance issues, lightheaded, feeling faint   Hydrocodone Palpitations   Penicillins Rash   Doxycycline Rash        Medication List    Airsupra  90-80 MCG/ACT Aero Generic drug: Albuterol -Budesonide  Inhale 2 puffs into the lungs every 6 (six) hours as needed.   amLODipine 5 MG tablet Commonly known as: NORVASC Take 5 mg by mouth daily.   budesonide -formoterol  160-4.5 MCG/ACT inhaler Commonly known as: SYMBICORT  Inhale 2 puffs into the lungs 2 (two) times daily.   famotidine  20 MG tablet Commonly known as: PEPCID  Take 1 tablet once daily   fluticasone  50 MCG/ACT nasal spray Commonly known as: FLONASE  Place 1 spray into both  nostrils daily.   levothyroxine 75 MCG tablet Commonly known as: SYNTHROID Take 75 mcg by mouth daily before breakfast.   rosuvastatin 10 MG tablet Commonly known as: CRESTOR Take 10 mg by mouth at bedtime.   Ryaltris  [id number] MCG/ACT Susp Generic drug: Olopatadine-Mometasone Use two sprays in each nostril one to two times daily as directed.   sertraline 50 MG tablet Commonly known as: ZOLOFT Take 75 mg by mouth daily.    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   Surgery had to be postponed     Depression    Dyspepsia 03/25/1997   One of my most prominent health issues, diagnosed at this time     Dyspnea on exertion    Excessive vitamin B12 intake 06/02/2021   Lab reports show excessive Vitamin B12     Family history of bladder cancer    Family history of breast cancer    Family history of heart disease    Family history of ovarian cancer    Fatty (change of) liver, not elsewhere classified 08/30/2015   Female bladder prolapse    Gastritis 03/22/2023  First detected, during EGD     Gastroesophageal reflux disease without esophagitis 12/04/2016   Generalized anxiety disorder 03/05/2017   Genetic testing 05/29/2023   Negative genetic testing on the CancerNext-Expanded+RNAinsight panel.  The report date is May 25, 2023.     The CancerNext-Expanded gene panel offered by [facility] and includes sequencing, rearrangement, and RNA analysis for the following 77 genes: AIP, ALK, APC, ATM, BAP1, BARD1, BMPR1A, BRCA1, BRCA2, BRIP1, CDC73, CDH1, CDK4, CDKN1B, CDKN2A, CEBPA, CHEK2, CTNNA1, DDX41, DICER1, ETV6, FH, FL   GERD (gastroesophageal reflux disease)    Hemochromatosis 01/08/2023    Hereditary hemochromatosis (HCC)    Hypertension    Hypothyroidism    Insulin resistance    Kidney stone 07/20/2022   Punctuate in size, right kidney     Left groin pain    Lyme disease    around 2009   Major depressive disorder, single episode, in full remission (HCC) 07/31/2023   Moderate persistent asthma 08/31/2015   Francis     NAFL (nonalcoholic fatty liver)    Osteopenia    Peripheral edema    Raynaud's phenomenon 12/30/2008   Raynaud's phenomenon without gangrene    Recurrent UTI 08/17/2022   Right groin pain    Seasonal allergic rhinitis 02/24/2014   Shortness of breath    Thyroid  goiter    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     Vaginal prolapse     Past Surgical History:  Procedure Laterality Date   BREAST BIOPSY Left    2018   CERVICAL CONE BIOPSY     CHOLECYSTECTOMY     COLONOSCOPY     ESOPHAGOGASTRODUODENOSCOPY     KNEE SURGERY     Bursa removed   LAPAROSCOPIC HYSTERECTOMY  2020    Review of systems negative except as noted in HPI / PMHx or noted below:  Review of Systems  Constitutional: Negative.   HENT: Negative.    Eyes: Negative.   Respiratory: Negative.    Cardiovascular: Negative.   Gastrointestinal: Negative.   Genitourinary: Negative.   Musculoskeletal: Negative.   Skin: Negative.   Neurological: Negative.   Endo/Heme/Allergies: Negative.   Psychiatric/Behavioral: Negative.       Objective:   Vitals:   08/20/23 1346  BP: 118/72  Pulse: 76  Resp: 16  SpO2: 96%          Physical Exam Constitutional:      Appearance: She is not diaphoretic.  HENT:     Head: Normocephalic.     Right Ear: Tympanic membrane, ear canal and external ear normal.     Left Ear: Tympanic membrane, ear canal and external ear normal.     Nose: Nose normal. No mucosal edema or rhinorrhea.     Mouth/Throat:     Pharynx: Uvula midline. No oropharyngeal exudate.  Eyes:     Conjunctiva/sclera: Conjunctivae normal.  Neck:      Thyroid : No thyromegaly.     Trachea: Trachea normal. No tracheal tenderness or tracheal deviation.  Cardiovascular:     Rate and Rhythm: Normal rate and regular rhythm.     Heart sounds: Normal heart sounds, S1 normal and S2 normal. No murmur heard. Pulmonary:     Effort: No respiratory distress.     Breath sounds: Normal breath sounds. No stridor. No wheezing or rales.  Lymphadenopathy:     Head:     Right side of head: No tonsillar adenopathy.     Left side of head: No tonsillar adenopathy.  Cervical: No cervical adenopathy.  Skin:    Findings: No erythema or rash.     Nails: There is no clubbing.  Neurological:     Mental Status: She is alert.     Diagnostics:    Spirometry was performed and demonstrated an FEV1 of 1.90 at 84 % of predicted.  Results of a sleep study obtained 20 July 2023 identified close to 14 minutes of sleep time with an oxygen saturation below 88% and a oxygen saturation nadir of 82% with a total duration of sleep time being 3 hours and 32 minutes.  Assessment and Plan:   1. Asthma, moderate persistent, well-controlled   2. Perennial allergic rhinitis   3. Seasonal allergic rhinitis due to pollen   4. Sicca syndrome (HCC)   5. Morning headache   6. Diastolic dysfunction   7. Gastroesophageal reflux disease, unspecified whether esophagitis present   8. Nocturnal hypoxemia   9. Migraine syndrome     1. Treat and prevent inflammation of airway:   A. Symbicort  160 - 2 inhalations 1-2 times per day (empty lungs)  B. Flonase  - 2 sprays each nostril 1 time per day  2. Avoid all oral antihistamines secondary to dry mouth and eyes  3. For dry eye and dry mouth can use:   A. Systane eye drops  B. Biotene mouthwash  C. Can revisit with ophthalmologist about eyedrops for dry eye  4. If needed:   A. Airsupra  - 2 inhalations every 4-6 hours  B. Nasal saline  5. Treat and prevent reflux:   A. Minimize any caffeine and chocolate and alcohol  consumption  B. Famotidine  20 mg - 1 tablet 1 time per day  6. Obtain sleep study with Dr. Annella.   7. Further evaluation of headache after correction of oxygen???  8. Influenza = Tamiflu. Covid = Paxlovid  9. Return to clinic in 12 weeks or earlier if problem  I think Diane's biggest issue is her nocturnal hypoxemia which is probably responsible for her brain fog, morning headaches / migraine activity, diastolic dysfunction and possibly some of her breathing issues.  She needs to get this corrected and I suspect her upcoming sleep study with Dr. Annella is going to identify obstructive sleep apnea or possibly central sleep apnea.  Either she will require CPAP, supplemental oxygen, inspire device to get this nocturnal hypoxemia corrected.  She will remain on anti-inflammatory agents for her airway and therapy directed against reflux and treatment of her sicca syndrome as noted above.  I did make the recommendation that she follow-up with her ophthalmologist about getting some eyedrops for her dry eye syndrome above and beyond her wetting solutions.  Camellia Denis, MD Allergy / Immunology [facility]

## 2023-08-21 ENCOUNTER — Encounter: Payer: Self-pay | Admitting: Allergy and Immunology

## 2023-08-21 NOTE — Telephone Encounter (Signed)
Information

## 2023-08-22 ENCOUNTER — Ambulatory Visit
Admission: RE | Admit: 2023-08-22 | Discharge: 2023-08-22 | Disposition: A | Discharge status: Home / Self Care | Source: Ambulatory Visit | Attending: Family Medicine | Admitting: Family Medicine

## 2023-08-22 ENCOUNTER — Inpatient Hospital Stay
Admission: RE | Admit: 2023-08-22 | Discharge: 2023-08-22 | Discharge status: Home / Self Care | Source: Ambulatory Visit | Attending: Family Medicine

## 2023-08-22 DIAGNOSIS — N644 Mastodynia: Secondary | ICD-10-CM

## 2023-08-22 DIAGNOSIS — R928 Other abnormal and inconclusive findings on diagnostic imaging of breast: Secondary | ICD-10-CM | POA: Diagnosis not present

## 2023-08-23 NOTE — Telephone Encounter (Signed)
 Received notification from [employer] website that CPT [id number] is approved from 08/24/2023 to 10/24/2023 ( Authorization #[id number]  Tracking #[id number]), however, patient has seen sleep pulmonology in the meantime and they are going to set her up for home sleep study.  Will hold off on sleep clinic sleep study at this time, per Dr. Kozlow.

## 2023-09-06 ENCOUNTER — Telehealth (HOSPITAL_BASED_OUTPATIENT_CLINIC_OR_DEPARTMENT_OTHER): Payer: Self-pay

## 2023-09-06 NOTE — Telephone Encounter (Unsigned)
 Copied from CRM [id number]. Topic: General - Other >> Sep 06, 2023 11:07 AM Whitney O wrote: Reason for CRM: patient is saying the sleep study device is not working . Patient is saying can she go to the facility and do a sleep study cause the device she has is not working and customer service is not able to help her either  [phone] please give patient a call concerning sleep study test

## 2023-09-07 ENCOUNTER — Ambulatory Visit: Admitting: Neurology

## 2023-09-12 ENCOUNTER — Ambulatory Visit: Admitting: Obstetrics and Gynecology

## 2023-09-13 ENCOUNTER — Encounter: Payer: Self-pay | Admitting: Hematology & Oncology

## 2023-09-13 ENCOUNTER — Ambulatory Visit: Payer: Self-pay | Admitting: Hematology & Oncology

## 2023-09-13 ENCOUNTER — Inpatient Hospital Stay

## 2023-09-13 ENCOUNTER — Inpatient Hospital Stay: Attending: Oncology

## 2023-09-13 ENCOUNTER — Inpatient Hospital Stay (HOSPITAL_BASED_OUTPATIENT_CLINIC_OR_DEPARTMENT_OTHER): Admitting: Hematology & Oncology

## 2023-09-13 DIAGNOSIS — Z79899 Other long term (current) drug therapy: Secondary | ICD-10-CM | POA: Diagnosis not present

## 2023-09-13 LAB — CMP (CANCER CENTER ONLY)
ALT: 22 U/L (ref 0–44)
AST: 28 U/L (ref 15–41)
Albumin: 4.4 g/dL (ref 3.5–5.0)
Alkaline Phosphatase: 76 U/L (ref 38–126)
Anion gap: 12 (ref 5–15)
BUN: 14 mg/dL (ref 8–23)
CO2: 23 mmol/L (ref 22–32)
Calcium: 9.6 mg/dL (ref 8.9–10.3)
Chloride: 105 mmol/L (ref 98–111)
Creatinine: 0.68 mg/dL (ref 0.44–1.00)
GFR, Estimated: >60 mL/min (ref >60)
Glucose, Bld: 89 mg/dL (ref 70–99)
Potassium: 4.6 mmol/L (ref 3.5–5.1)
Sodium: 140 mmol/L (ref 135–145)
Total Bilirubin: 0.3 mg/dL (ref 0.0–1.2)
Total Protein: 7 g/dL (ref 6.5–8.1)

## 2023-09-13 LAB — FERRITIN: Ferritin: 51 ng/mL (ref 11–307)

## 2023-09-13 LAB — CBC WITH DIFFERENTIAL (CANCER CENTER ONLY)
Abs Immature Granulocytes: 0.01 K/uL (ref 0.00–0.07)
Basophils Absolute: 0.1 K/uL (ref 0.0–0.1)
Basophils Relative: 1 %
Eosinophils Absolute: 0.3 K/uL (ref 0.0–0.5)
Eosinophils Relative: 7 %
HCT: 42.9 % (ref 36.0–46.0)
Hemoglobin: 14.2 g/dL (ref 12.0–15.0)
Immature Granulocytes: 0 %
Lymphocytes Relative: 33 %
Lymphs Abs: 1.6 K/uL (ref 0.7–4.0)
MCH: 33 pg (ref 26.0–34.0)
MCHC: 33.1 g/dL (ref 30.0–36.0)
MCV: 99.8 fL (ref 80.0–100.0)
Monocytes Absolute: 0.5 K/uL (ref 0.1–1.0)
Monocytes Relative: 10 %
Neutro Abs: 2.4 K/uL (ref 1.7–7.7)
Neutrophils Relative %: 49 %
Platelet Count: 217 K/uL (ref 150–400)
RBC: 4.3 MIL/uL (ref 3.87–5.11)
RDW: 12.1 % (ref 11.5–15.5)
WBC Count: 4.9 K/uL (ref 4.0–10.5)
nRBC: 0 % (ref 0.0–0.2)

## 2023-09-13 LAB — IRON AND IRON BINDING CAPACITY (CC-WL,HP ONLY)
Iron: 119 ug/dL (ref 28–170)
Saturation Ratios: 35 % — ABNORMAL HIGH (ref 10.4–31.8)
TIBC: 337 ug/dL (ref 250–450)
UIBC: 218 ug/dL

## 2023-09-13 MED ORDER — SODIUM CHLORIDE 0.9 % IV SOLN: INTRAVENOUS | Status: DC

## 2023-09-13 NOTE — Progress Notes (Signed)
 Yolanda Vargas presents today for phlebotomy per MD orders. Phlebotomy procedure started at 0955 and ended at 1025 via 18 gauge angio-cath to left ac and 500 ml.'s  NS given over 30 minutes per MD order. 550 grams removed without difficulty.  Patient observed for 30 minutes after procedure without any incident. Patient tolerated procedure well. IV needle removed intact.

## 2023-09-13 NOTE — Progress Notes (Signed)
 Hematology and Oncology Follow Up Visit  Yolanda Vargas [id number] 1950/12/13 73 y.o. 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  She is feeling a little bit better.  She just got back from [facility].  She is they are visiting her daughter and her friend.  She does like to travel.  She unfortunately cannot have the sleep study done at home.  As such, I think there is can be a more formal sleep study done to evaluate for the possibility of sleep apnea.  Her last iron studies that were done in August showed a ferritin of 70 with iron saturation of 21%.  This is come down quite nicely with phlebotomies.  She did have an MRI of the liver.  This was to assess for iron deposition.  The results are not back yet.  She has had a good appetite.  She has had no problems with nausea or vomiting.  She has had no issues with cough or shortness of breath.  She has had no problems with fever.  She has had no bleeding.  There is been no change in bowel or bladder habits.  She has had no leg swelling.  Overall, I would say that her performance status is probably ECOG 0.   Medications:  Current Outpatient Medications:    AIRSUPRA  90-80 MCG/ACT AERO, Inhale 2 puffs into the lungs every 6 (six) hours as needed., Disp: 10.7 g, Rfl: 1   budesonide -formoterol  (SYMBICORT ) 160-4.5 MCG/ACT inhaler, Inhale 2 puffs into the lungs 2 (two) times daily., Disp: 10.2 g, Rfl: 6   famotidine  (PEPCID ) 20 MG tablet, Take 1 tablet once daily, Disp: 30 tablet, Rfl: 5   fluticasone  (FLONASE ) 50 MCG/ACT nasal spray, Place 1 spray into both nostrils daily. (Patient taking differently: Place 1 spray into both nostrils daily as needed.), Disp: 16 g, Rfl: 1   levothyroxine (SYNTHROID) 75 MCG tablet, Take 75 mcg by mouth daily before  breakfast., Disp: , Rfl:    losartan (COZAAR) 25 MG tablet, Take 25 mg by mouth daily., Disp: , Rfl:    rosuvastatin (CRESTOR) 10 MG tablet, Take 10 mg by mouth at bedtime., Disp: , Rfl:    sertraline (ZOLOFT) 50 MG tablet, Take 75 mg by mouth daily. (Patient taking differently: Take 50 mg by mouth daily.), Disp: , Rfl:   Allergies:  Allergies  Allergen Reactions   Diltiazem Hcl Other (See Comments)    Stoke like symptoms/more like balance issues, lightheaded, feeling faint     Hydrocodone Palpitations   Penicillins Rash   Doxycycline Rash    Past Medical History, Surgical history, Social history, and Family History were reviewed and updated.  Review of Systems: Review of Systems  Constitutional: Negative.   HENT:  Negative.    Eyes: Negative.   Respiratory: Negative.    Cardiovascular:  Positive for palpitations.  Gastrointestinal: Negative.   Endocrine: Negative.   Genitourinary: Negative.    Musculoskeletal:  Positive for arthralgias and myalgias.  Skin:  Positive for itching.  Neurological: Negative.   Hematological:  Bruises/bleeds easily.  Psychiatric/Behavioral: Negative.      Physical Exam:  Vital signs show temperature 97.7.  Pulse 74.  Blood pressure 131/74.  Weight is 148 pounds.  Wt Readings from Last 3 Encounters:  08/16/23 146 lb 1.3 oz (66.3 kg)  08/15/23 147 lb (66.7 kg)  08/13/23 146 lb 12.8 oz (66.6 kg)    Physical Exam Vitals reviewed.  HENT:     Head: Normocephalic and atraumatic.  Eyes:     Pupils: Pupils are equal, round, and reactive to light.  Cardiovascular:     Rate and Rhythm: Normal rate and regular rhythm.     Heart sounds: Normal heart sounds.  Pulmonary:     Effort: Pulmonary effort is normal.     Breath sounds: Normal breath sounds.  Abdominal:     General: Bowel sounds are normal.     Palpations: Abdomen is soft.  Musculoskeletal:        General: No tenderness or deformity. Normal range of motion.     Cervical back:  Normal range of motion.  Lymphadenopathy:     Cervical: No cervical adenopathy.  Skin:    General: Skin is warm and dry.     Findings: No erythema or rash.  Neurological:     Mental Status: She is alert and oriented to person, place, and time.  Psychiatric:        Behavior: Behavior normal.        Thought Content: Thought content normal.        Judgment: Judgment normal.      Lab Results  Component Value Date   WBC 4.9 09/13/2023   HGB 14.2 09/13/2023   HCT 42.9 09/13/2023   MCV 99.8 09/13/2023   PLT 217 09/13/2023     Chemistry      Component Value Date/Time   NA 141 08/16/2023 0949   NA 142 07/20/2023 1102   K 4.5 08/16/2023 0949   CL 102 08/16/2023 0949   CO2 25 08/16/2023 0949   BUN 18 08/16/2023 0949   BUN 16 07/20/2023 1102   CREATININE 0.73 08/16/2023 0949      Component Value Date/Time   CALCIUM 10.1 08/16/2023 0949   ALKPHOS 80 08/16/2023 0949   AST 27 08/16/2023 0949   ALT 20 08/16/2023 0949   BILITOT 0.4 08/16/2023 0949      Impression and Plan: Yolanda Vargas is a very charming 73 year old white female.  She has homozygous hemochromatosis.  We have been phlebotomizing her.  I had believe that the hemochromatosis is under good control.  We will see what her iron levels are today.  She would like to have a phlebotomy.  We certainly could do 1 given that her hemoglobin is doing so well right now.  We will plan to get her back I think in about 6 weeks.  Hopefully, we will work her schedule so that we will get her back before the Holiday season.  We still have to see what the iron studies are for the liver.  We will have to monitor this.     Maude JONELLE Crease, MD 9/11/20259:13 AM

## 2023-09-14 ENCOUNTER — Encounter: Payer: Self-pay | Admitting: Oncology

## 2023-09-14 NOTE — Telephone Encounter (Signed)
-----   Message from Maude JONELLE Crease sent at 09/13/2023  4:52 PM EDT ----- Please call and let him know that the ferritin is 51 with an iron saturation of 35%.  These are both quite good.  Jeralyn ----- Message ----- From: Rebecka, [facility] [place] Sent: 09/13/2023   9:04 AM EDT To: Maude JONELLE Crease, MD

## 2023-09-14 NOTE — Telephone Encounter (Signed)
 Advised via MyChart.

## 2023-09-15 ENCOUNTER — Encounter

## 2023-09-15 DIAGNOSIS — G4719 Other hypersomnia: Secondary | ICD-10-CM

## 2023-09-15 DIAGNOSIS — G4730 Sleep apnea, unspecified: Secondary | ICD-10-CM | POA: Diagnosis not present

## 2023-09-21 ENCOUNTER — Encounter: Payer: Self-pay | Admitting: *Deleted

## 2023-09-21 DIAGNOSIS — E7800 Pure hypercholesterolemia, unspecified: Secondary | ICD-10-CM | POA: Diagnosis not present

## 2023-09-21 DIAGNOSIS — I10 Essential (primary) hypertension: Secondary | ICD-10-CM | POA: Diagnosis not present

## 2023-09-21 DIAGNOSIS — E559 Vitamin D deficiency, unspecified: Secondary | ICD-10-CM | POA: Diagnosis not present

## 2023-09-21 DIAGNOSIS — E039 Hypothyroidism, unspecified: Secondary | ICD-10-CM | POA: Diagnosis not present

## 2023-09-21 DIAGNOSIS — R7303 Prediabetes: Secondary | ICD-10-CM | POA: Diagnosis not present

## 2023-09-21 DIAGNOSIS — R748 Abnormal levels of other serum enzymes: Secondary | ICD-10-CM | POA: Diagnosis not present

## 2023-09-21 DIAGNOSIS — K760 Fatty (change of) liver, not elsewhere classified: Secondary | ICD-10-CM | POA: Diagnosis not present

## 2023-09-21 NOTE — Progress Notes (Signed)
 Request from Dr Timmy to obtain report from July 2025 [place]. This task was extremely time consuming and involved speaking to multiple people over 4 days.   Results obtained this morning. Report given to Dr Timmy and copy placed in scan bin.   Oncology Nurse Navigator Documentation     09/21/2023   10:00 AM  Oncology Nurse Navigator Flowsheets  Navigator Location [facility]  Navigator Encounter Type Scan Review  Time Spent with Patient > 120

## 2023-09-24 ENCOUNTER — Ambulatory Visit (INDEPENDENT_AMBULATORY_CARE_PROVIDER_SITE_OTHER): Admitting: Neurology

## 2023-09-24 ENCOUNTER — Encounter: Payer: Self-pay | Admitting: Neurology

## 2023-09-24 DIAGNOSIS — R202 Paresthesia of skin: Secondary | ICD-10-CM

## 2023-09-24 DIAGNOSIS — R748 Abnormal levels of other serum enzymes: Secondary | ICD-10-CM | POA: Diagnosis not present

## 2023-09-24 DIAGNOSIS — E162 Hypoglycemia, unspecified: Secondary | ICD-10-CM | POA: Diagnosis not present

## 2023-09-24 NOTE — Procedures (Signed)
 [facility]  [address] [place], [address]  [place], [place] [address] Tel: [phone] Fax: [phone] Test Date:  09/24/2023  Patient: Yolanda Vargas DOB: 1950-04-29 Physician: Venetia Potters, MD  Sex: Female Height: 5' 6 Ref Phys: Venetia Potters, MD  ID#: [id number]   Technician:    History: This is a 73 year old female with numbness and tingling in her arms and legs.  NCV & EMG Findings: Extensive electrodiagnostic evaluation of the left upper and lower limbs with additional nerve conduction studies of the right upper limb shows: Left sural, superficial peroneal/fibular, median, median-ulnar palmar, ulnar, and radial sensory responses are within normal limits. Right median sensory responses is within normal limits. Left peroneal/fibular (EDB), tibial (AH), median (APB), and ulnar (ADM) motor responses are within normal limits. Left H reflex is absent. There is no evidence of active or chronic motor axon loss changes affecting any of the tested muscles on needle examination. Motor unit configuration and recruitment pattern is within normal limits.  Impression: This is a normal study. Specifically: No electrodiagnostic evidence of a large fiber sensorimotor neuropathy. No electrodiagnostic evidence of a left cervical (C5-C8) or lumbosacral (L3-S1) motor radiculopathy. No electrodiagnostic evidence of a left or right median mononeuropathy at or distal to the wrist (ie: carpal tunnel syndrome).    ___________________________ Venetia Potters, MD    Nerve Conduction Studies Motor Nerve Results    Latency Amplitude F-Lat Segment Distance CV Comment  Site (ms) Norm (mV) Norm (ms)  (cm) (m/s) Norm   Left Fibular (EDB) Motor  Ankle 4.8  < 6.0 4.5  > 2.5        Bel fib head 12.9 - 3.9 -  Bel fib head-Ankle 33 41  > 40   Pop fossa 15.1 - 3.7 -  Pop fossa-Bel fib head 9 41 -   Left Median (APB) Motor  Wrist 2.6  < 4.0 5.0  > 5.0      moved electrodes x4, could not eliminate dip   Elbow 7.8 - 4.8 -  Elbow-Wrist 29 56  > 50   Left Tibial (AH) Motor  Ankle 4.0  < 6.0 6.7  > 4.0        Knee 13.8 - 4.7 -  Knee-Ankle 40 41  > 40   Left Ulnar (ADM) Motor  Wrist 2.4  < 3.1 10.4  > 7.0        Bel elbow 6.4 - 8.5 -  Bel elbow-Wrist 20 50  > 50   Ab elbow 8.4 - 8.2 -  Ab elbow-Bel elbow 10 50 -    Sensory Sites    Neg Peak Lat Amplitude (O-P) Segment Distance Velocity Comment  Site (ms) Norm (V) Norm  (cm) (ms)   Left Median Sensory  Wrist-Dig II 3.3  < 3.8 45  > 10 Wrist-Dig II 13    Right Median Sensory  Wrist-Dig II 3.7  < 3.8 46  > 10 Wrist-Dig II 13    Left Median-Ulnar Palmar Sensory       Median  Palm-Wrist 1.93  < 2.2 147  > 10 Palm-Wrist 8         Ulnar  Palm-Wrist 2.2  < 2.2 82  > 5 Palm-Wrist 8    Left Radial Sensory  Forearm-Wrist 2.7  < 2.8 28  > 10 Forearm-Wrist 10    Left Superficial Fibular Sensory  14 cm-Ankle 2.2  < 4.6 5  > 3 14 cm-Ankle 14    Left Sural Sensory  Calf-Lat mall 3.0  < 4.6 8  > 3 Calf-Lat mall 10    Left Ulnar Sensory  Wrist-Dig V 3.1  < 3.2 41  > 5 Wrist-Dig V 11     H-Reflex Results    M-Lat H Lat H Neg Amp H-M Lat  Site (ms) (ms) Norm (mV) (ms)  Left Tibial H-Reflex  Pop fossa 6.9 ---  < 35.0 --- ---   Inter-Nerve Comparisons   Nerve 1 Value 1 Nerve 2 Value 2 Parameter Result Normal  Sensory Sites  L Median Palm-Wrist 1.93 ms L Ulnar Palm-Wrist 2.2 ms Peak Lat Diff 0.27 ms <0.40   Electromyography   Side Muscle Ins.Act Fibs Fasc Recrt Amp Dur Poly Activation Comment  Left Tib ant Nml Nml Nml Nml Nml Nml Nml Nml N/A  Left Gastroc MH Nml Nml Nml Nml Nml Nml Nml Nml N/A  Left Rectus fem Nml Nml Nml Nml Nml Nml Nml Nml N/A  Left Biceps fem SH Nml Nml Nml Nml Nml Nml Nml Nml N/A  Left Gluteus med Nml Nml Nml Nml Nml Nml Nml Nml N/A  Left FDI Nml Nml Nml Nml Nml Nml Nml Nml N/A  Left Pronator teres Nml Nml Nml Nml Nml Nml Nml Nml N/A  Left Biceps Nml Nml Nml Nml Nml Nml Nml Nml N/A  Left Triceps lat hd Nml Nml Nml  Nml Nml Nml Nml Nml N/A  Left Deltoid Nml Nml Nml Nml Nml Nml Nml Nml N/A      Waveforms:  Motor           Sensory                  H-Reflex

## 2023-09-25 ENCOUNTER — Telehealth: Payer: Self-pay

## 2023-09-25 NOTE — Telephone Encounter (Signed)
 Pt insurance does not require a PA for the Skin Biopsy. Ref call # [id number]

## 2023-10-01 ENCOUNTER — Encounter (HOSPITAL_COMMUNITY): Payer: Self-pay

## 2023-10-02 ENCOUNTER — Other Ambulatory Visit: Payer: Self-pay | Admitting: Cardiology

## 2023-10-02 ENCOUNTER — Ambulatory Visit (HOSPITAL_COMMUNITY)
Admission: RE | Admit: 2023-10-02 | Discharge: 2023-10-02 | Disposition: A | Discharge status: Home / Self Care | Source: Ambulatory Visit | Attending: Cardiology | Admitting: Cardiology

## 2023-10-02 MED ORDER — GADOBUTROL 1 MMOL/ML IV SOLN
10.0000 mL | Freq: Once | INTRAVENOUS | Status: AC | PRN
  Administered 2023-10-02: 10 mL via INTRAVENOUS

## 2023-10-03 ENCOUNTER — Ambulatory Visit (HOSPITAL_BASED_OUTPATIENT_CLINIC_OR_DEPARTMENT_OTHER)

## 2023-10-08 ENCOUNTER — Telehealth: Payer: Self-pay | Admitting: Pulmonary Disease

## 2023-10-08 DIAGNOSIS — G4733 Obstructive sleep apnea (adult) (pediatric): Secondary | ICD-10-CM | POA: Diagnosis not present

## 2023-10-08 NOTE — Telephone Encounter (Signed)
 Call patient  Sleep study result  Date of study: 09/15/2023  Impression: Severe obstructive sleep apnea with moderate oxygen desaturations. AHI is 30.2, O2 nadir of 80%, saturations was below 88% for 21 minutes, 6% of the study.  Recommendation: DME referral  Recommend CPAP therapy for severe obstructive sleep apnea.  Auto-titrating CPAP with pressure settings of 5-15 should be appropriate, along with heated humidification using the patient's preferred mask.  Encourage weight loss measures.  Schedule follow-up in the office 4 to 6 weeks after initiation. of treatment

## 2023-10-09 NOTE — Telephone Encounter (Signed)
 Called and spoke with the patient.  Relayed sleep study results.  Pt would like to proceed with CPAP.   I am placing order for CPAP.  Pt would also like [place], [place] and Dr. Annella to be aware of MR Cardiac imaging from 9/30. Pt thinks her results can also be affecting her sleep apnea.

## 2023-10-10 ENCOUNTER — Encounter: Payer: Self-pay | Admitting: Cardiology

## 2023-10-10 DIAGNOSIS — I319 Disease of pericardium, unspecified: Secondary | ICD-10-CM

## 2023-10-10 NOTE — Telephone Encounter (Signed)
 Called and spoke to patient.NFN

## 2023-10-11 DIAGNOSIS — K5900 Constipation, unspecified: Secondary | ICD-10-CM | POA: Diagnosis not present

## 2023-10-11 DIAGNOSIS — Z860100 Personal history of colon polyps, unspecified: Secondary | ICD-10-CM | POA: Diagnosis not present

## 2023-10-11 DIAGNOSIS — R110 Nausea: Secondary | ICD-10-CM | POA: Diagnosis not present

## 2023-10-11 DIAGNOSIS — K317 Polyp of stomach and duodenum: Secondary | ICD-10-CM | POA: Diagnosis not present

## 2023-10-12 ENCOUNTER — Telehealth: Payer: Self-pay | Admitting: Cardiology

## 2023-10-12 NOTE — Telephone Encounter (Signed)
 Pt requesting a c/b in regards to discuss her MRI results.

## 2023-10-12 NOTE — Telephone Encounter (Signed)
 Advised that results are not completed at this time. Pt verbalized understanding and had no additional questions.

## 2023-10-15 ENCOUNTER — Ambulatory Visit (HOSPITAL_BASED_OUTPATIENT_CLINIC_OR_DEPARTMENT_OTHER)

## 2023-10-16 ENCOUNTER — Ambulatory Visit (INDEPENDENT_AMBULATORY_CARE_PROVIDER_SITE_OTHER): Admitting: Neurology

## 2023-10-16 ENCOUNTER — Encounter: Payer: Self-pay | Admitting: Hematology & Oncology

## 2023-10-16 ENCOUNTER — Telehealth: Payer: Self-pay | Admitting: Cardiology

## 2023-10-16 DIAGNOSIS — R202 Paresthesia of skin: Secondary | ICD-10-CM

## 2023-10-16 NOTE — Patient Instructions (Signed)
 INFORMATION FOR PATIENTS AFTER SKIN BIOPSY:   What You Need to Do:  1. Keep your current bandage on for 24 hours and do not shower during this time. Change your bandages every day starting tomorrow. Leave today's bandage on until AFTER your next shower. Keep the bandages on while you shower. Change them once a day until a scab forms.  2. You may take showers after 24 hours, but DO NOT take tub baths, go in hot tubs or go swimming for seven days after the procedure.  3. You may use vasoline, bacitracin, or Polysporin ointment on the wounds as needed.  4. If a scab forms at the biopsy site, leave it alone.  5. If bleeding occurs, apply firm pressure for two minutes with a clean piece of gauze.   Please contact us  immediately if there is any redness, any signs of infection, or significant bleeding at the biopsy site.   Please call our office at [phone].

## 2023-10-16 NOTE — Progress Notes (Signed)
 Punch Biopsy Procedure Note  Preprocedure Diagnosis: paresthesia of skin   Postprocedure Diagnosis: same  Locations: Site 1: right lateral distal leg;  Site 2: right lateral thigh  Indications: r/o small fiber neuropathy  Anesthesia: 5 mL Lidocaine  1% with epinephrine  Procedure Details Patient informed of the risks (including but not limited to bleeding, pain, infection, scar and infection) and benefits of the procedure.  Informed consent obtained.  The areas which were chosen for biopsy, as above, and surrounding areas were given a sterile prep using alcohol and iodine. The skin was then stretched perpendicular to the skin tension lines and sample removed using the 3 mm punch. Pressure applied, hemostasis achieved.   Dressing applied. The specimen(s) was sent for pathologic examination. The patient tolerated the procedure well.  Estimated Blood Loss: 0 ml  Condition: Stable  Complications: none.  Plan: 1. Instructed to keep the wound dry and covered for 24h and clean thereafter. 2. Warning signs of infection were reviewed.    Venetia Potters, MD [facility]

## 2023-10-16 NOTE — Telephone Encounter (Signed)
 Pt requesting provider switch to Dr. Acharya if possible. Please advise

## 2023-10-17 DIAGNOSIS — I319 Disease of pericardium, unspecified: Secondary | ICD-10-CM | POA: Diagnosis not present

## 2023-10-18 ENCOUNTER — Inpatient Hospital Stay: Admitting: Hematology & Oncology

## 2023-10-18 ENCOUNTER — Inpatient Hospital Stay: Attending: Oncology

## 2023-10-18 ENCOUNTER — Telehealth: Payer: Self-pay

## 2023-10-18 ENCOUNTER — Encounter: Payer: Self-pay | Admitting: Hematology & Oncology

## 2023-10-18 ENCOUNTER — Ambulatory Visit: Payer: Self-pay | Admitting: Cardiology

## 2023-10-18 ENCOUNTER — Inpatient Hospital Stay

## 2023-10-18 LAB — CBC WITH DIFFERENTIAL (CANCER CENTER ONLY)
Abs Immature Granulocytes: 0.04 K/uL (ref 0.00–0.07)
Basophils Absolute: 0.1 K/uL (ref 0.0–0.1)
Basophils Relative: 1 %
Eosinophils Absolute: 0.3 K/uL (ref 0.0–0.5)
Eosinophils Relative: 5 %
HCT: 43.4 % (ref 36.0–46.0)
Hemoglobin: 14.6 g/dL (ref 12.0–15.0)
Immature Granulocytes: 1 %
Lymphocytes Relative: 30 %
Lymphs Abs: 1.7 K/uL (ref 0.7–4.0)
MCH: 33.1 pg (ref 26.0–34.0)
MCHC: 33.6 g/dL (ref 30.0–36.0)
MCV: 98.4 fL (ref 80.0–100.0)
Monocytes Absolute: 0.5 K/uL (ref 0.1–1.0)
Monocytes Relative: 9 %
Neutro Abs: 3.1 K/uL (ref 1.7–7.7)
Neutrophils Relative %: 54 %
Platelet Count: 211 K/uL (ref 150–400)
RBC: 4.41 MIL/uL (ref 3.87–5.11)
RDW: 12 % (ref 11.5–15.5)
WBC Count: 5.6 K/uL (ref 4.0–10.5)
nRBC: 0 % (ref 0.0–0.2)

## 2023-10-18 LAB — CMP (CANCER CENTER ONLY)
ALT: 23 U/L (ref 0–44)
AST: 30 U/L (ref 15–41)
Albumin: 4.7 g/dL (ref 3.5–5.0)
Alkaline Phosphatase: 85 U/L (ref 38–126)
Anion gap: 10 (ref 5–15)
BUN: 18 mg/dL (ref 8–23)
CO2: 28 mmol/L (ref 22–32)
Calcium: 9.9 mg/dL (ref 8.9–10.3)
Chloride: 103 mmol/L (ref 98–111)
Creatinine: 0.71 mg/dL (ref 0.44–1.00)
GFR, Estimated: >60 mL/min (ref >60)
Glucose, Bld: 102 mg/dL — ABNORMAL HIGH (ref 70–99)
Potassium: 4.4 mmol/L (ref 3.5–5.1)
Sodium: 141 mmol/L (ref 135–145)
Total Bilirubin: 0.4 mg/dL (ref 0.0–1.2)
Total Protein: 7.3 g/dL (ref 6.5–8.1)

## 2023-10-18 LAB — IRON AND IRON BINDING CAPACITY (CC-WL,HP ONLY)
Iron: 122 ug/dL (ref 28–170)
Saturation Ratios: 34 % — ABNORMAL HIGH (ref 10.4–31.8)
TIBC: 364 ug/dL (ref 250–450)
UIBC: 242 ug/dL

## 2023-10-18 LAB — RETICULOCYTES
Immature Retic Fract: 3.2 % (ref 2.3–15.9)
RBC.: 4.36 MIL/uL (ref 3.87–5.11)
Retic Count, Absolute: 47.1 K/uL (ref 19.0–186.0)
Retic Ct Pct: 1.1 % (ref 0.4–3.1)

## 2023-10-18 LAB — FERRITIN: Ferritin: 45 ng/mL (ref 11–307)

## 2023-10-18 LAB — C-REACTIVE PROTEIN: CRP: <1 mg/L (ref 0–10)

## 2023-10-18 LAB — SEDIMENTATION RATE: Sed Rate: 2 mm/h (ref 0–40)

## 2023-10-18 MED ORDER — SODIUM CHLORIDE 0.9 % IV SOLN: INTRAVENOUS | Status: DC

## 2023-10-18 MED ORDER — SODIUM CHLORIDE 0.9 % IV SOLN: Freq: Once | INTRAVENOUS | Status: DC

## 2023-10-18 NOTE — Progress Notes (Signed)
 Yolanda Vargas presents today for phlebotomy per MD orders. Phlebotomy procedure started at 0944 and ended at 1004. 520 grams removed. IVF NS 500 mls up. Patient observed for 30 minutes after procedure without any incident. Patient tolerated procedure well. IV needle removed intact.

## 2023-10-18 NOTE — Progress Notes (Signed)
 Hematology and Oncology Follow Up Visit  Yolanda Vargas [id number] 06/25/1950 73 y.o. 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  I think that the problem that she is having now is cardiac.  It is not clear at all as to what the actual problem might be.  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  This is versus restrictive carditis.  Again, I am not sure exactly how this has come about.  I know that she had an echocardiogram back in July of this year.  Everything looked fine.  The pericardium looked okay.  She had good left ventricular ejection fraction.  There may have been a little bit of diastolic dysfunction.  Again, she is seeing Cardiology.  She is wondering if she needs to go to an [facility] to see if they may have more technology to evaluate this.  Again, I see nothing that would be related to hemochromatosis.  Her iron levels have been doing great.  We have had her hemochromatosis under very good control.  When we last saw her, her iron saturation was 35%.  Her ferritin was only 51.  She got phlebotomized at that time.  She has had no other problems.  She has had no leg swelling.  There is no bleeding.  She has had no fever.  She has had no cough.  Currently, I would have to set her performance status is probably ECOG 1.    Medications:  Current Outpatient Medications:    AIRSUPRA  90-80 MCG/ACT AERO, Inhale 2 puffs into the lungs every 6 (six) hours as needed., Disp: 10.7 g, Rfl: 1   budesonide -formoterol  (SYMBICORT ) 160-4.5 MCG/ACT inhaler, Inhale 2 puffs into the lungs 2 (two) times daily., Disp: 10.2 g, Rfl: 6   famotidine  (PEPCID ) 20 MG tablet, Take 1 tablet once daily, Disp: 30 tablet, Rfl: 5   fluticasone  (FLONASE ) 50 MCG/ACT nasal spray, Place 1  spray into both nostrils daily. (Patient taking differently: Place 1 spray into both nostrils daily as needed.), Disp: 16 g, Rfl: 1   levothyroxine (SYNTHROID) 75 MCG tablet, Take 75 mcg by mouth daily before breakfast., Disp: , Rfl:    losartan (COZAAR) 25 MG tablet, Take 25 mg by mouth daily., Disp: , Rfl:    rosuvastatin (CRESTOR) 10 MG tablet, Take 10 mg by mouth at bedtime., Disp: , Rfl:    sertraline (ZOLOFT) 50 MG tablet, Take 75 mg by mouth daily. (Patient taking differently: Take 50 mg by mouth daily.), Disp: , Rfl:   Allergies:  Allergies  Allergen Reactions   Diltiazem Hcl Other (See Comments)    Stoke like symptoms/more like balance issues, lightheaded, feeling faint     Hydrocodone Palpitations   Penicillins Rash   Doxycycline Rash    Past Medical History, Surgical history, Social history, and Family History were reviewed and updated.  Review of Systems: Review of Systems  Constitutional: Negative.   HENT:  Negative.    Eyes: Negative.   Respiratory: Negative.    Cardiovascular:  Positive for palpitations.  Gastrointestinal: Negative.   Endocrine: Negative.   Genitourinary: Negative.    Musculoskeletal:  Positive for arthralgias and myalgias.  Skin:  Positive for itching.  Neurological: Negative.   Hematological:  Bruises/bleeds easily.  Psychiatric/Behavioral: Negative.  Physical Exam:  Vital signs show temperature 98.7.  Pulse 72.  Blood pressure 112/62.  Weight is 147 pounds.   Sure she will Wt Readings from Last 3 Encounters:  10/18/23 147 lb 1.9 oz (66.7 kg)  09/13/23 148 lb (67.1 kg)  08/16/23 146 lb 1.3 oz (66.3 kg)    Physical Exam Vitals reviewed.  HENT:     Head: Normocephalic and atraumatic.  Eyes:     Pupils: Pupils are equal, round, and reactive to light.  Cardiovascular:     Rate and Rhythm: Normal rate and regular rhythm.     Heart sounds: Normal heart sounds.  Pulmonary:     Effort: Pulmonary effort is normal.     Breath  sounds: Normal breath sounds.  Abdominal:     General: Bowel sounds are normal.     Palpations: Abdomen is soft.  Musculoskeletal:        General: No tenderness or deformity. Normal range of motion.     Cervical back: Normal range of motion.  Lymphadenopathy:     Cervical: No cervical adenopathy.  Skin:    General: Skin is warm and dry.     Findings: No erythema or rash.  Neurological:     Mental Status: She is alert and oriented to person, place, and time.  Psychiatric:        Behavior: Behavior normal.        Thought Content: Thought content normal.        Judgment: Judgment normal.      Lab Results  Component Value Date   WBC 5.6 10/18/2023   HGB 14.6 10/18/2023   HCT 43.4 10/18/2023   MCV 98.4 10/18/2023   PLT 211 10/18/2023     Chemistry      Component Value Date/Time   NA 140 09/13/2023 0854   NA 142 07/20/2023 1102   K 4.6 09/13/2023 0854   CL 105 09/13/2023 0854   CO2 23 09/13/2023 0854   BUN 14 09/13/2023 0854   BUN 16 07/20/2023 1102   CREATININE 0.68 09/13/2023 0854      Component Value Date/Time   CALCIUM 9.6 09/13/2023 0854   ALKPHOS 76 09/13/2023 0854   AST 28 09/13/2023 0854   ALT 22 09/13/2023 0854   BILITOT 0.3 09/13/2023 0854      Impression and Plan: Yolanda Vargas is a very charming 73 year old white female.  She has homozygous hemochromatosis.  We have been phlebotomizing her.  I had believe that the hemochromatosis is under good control.  We will see what her iron levels are today.  We will go ahead and phlebotomize her today.  Again, I think this just helps her.  She feels better when she has a phlebotomy.  Again, I am not sure exactly what the issue is with her heart.  I am sure that Cardiology will sort all of this out.  We will plan to get her back now in about 2 months.  I would like to get her back between Thanksgiving and Christmas.    Maude JONELLE Crease, MD 10/16/20259:10 AM

## 2023-10-18 NOTE — Patient Instructions (Signed)

## 2023-10-18 NOTE — Telephone Encounter (Signed)
 Left message on My Chart with lab results per Dr. Karry note. Routed to PCP

## 2023-10-30 ENCOUNTER — Encounter: Payer: Self-pay | Admitting: Neurology

## 2023-10-30 ENCOUNTER — Encounter (HOSPITAL_BASED_OUTPATIENT_CLINIC_OR_DEPARTMENT_OTHER): Payer: Self-pay

## 2023-11-05 ENCOUNTER — Encounter: Payer: Self-pay | Admitting: Neurology

## 2023-11-05 ENCOUNTER — Encounter: Payer: Self-pay | Admitting: Radiology

## 2023-11-14 ENCOUNTER — Telehealth: Payer: Self-pay | Admitting: *Deleted

## 2023-11-14 DIAGNOSIS — G629 Polyneuropathy, unspecified: Secondary | ICD-10-CM | POA: Diagnosis not present

## 2023-11-14 DIAGNOSIS — E039 Hypothyroidism, unspecified: Secondary | ICD-10-CM | POA: Diagnosis not present

## 2023-11-14 DIAGNOSIS — I10 Essential (primary) hypertension: Secondary | ICD-10-CM | POA: Diagnosis not present

## 2023-11-14 DIAGNOSIS — I7300 Raynaud's syndrome without gangrene: Secondary | ICD-10-CM | POA: Diagnosis not present

## 2023-11-14 DIAGNOSIS — J4540 Moderate persistent asthma, uncomplicated: Secondary | ICD-10-CM | POA: Diagnosis not present

## 2023-11-14 DIAGNOSIS — G4733 Obstructive sleep apnea (adult) (pediatric): Secondary | ICD-10-CM | POA: Diagnosis not present

## 2023-11-14 DIAGNOSIS — F419 Anxiety disorder, unspecified: Secondary | ICD-10-CM | POA: Diagnosis not present

## 2023-11-14 NOTE — Telephone Encounter (Signed)
 Received CMN from [employer] for CPAP supplies.  Form signed by Dr. Neda and faxed to [phone] on 10/24/2023.  Received a fax confirmation that the fax was sent successfully.  Sent to scan.  Nothing further needed.

## 2023-11-27 ENCOUNTER — Ambulatory Visit (INDEPENDENT_AMBULATORY_CARE_PROVIDER_SITE_OTHER)

## 2023-11-27 ENCOUNTER — Encounter (HOSPITAL_BASED_OUTPATIENT_CLINIC_OR_DEPARTMENT_OTHER): Payer: Self-pay

## 2023-11-27 VITALS — BP 125/76 | HR 74 | Ht 66.0 in | Wt 150.0 lb

## 2023-11-27 DIAGNOSIS — G4733 Obstructive sleep apnea (adult) (pediatric): Secondary | ICD-10-CM

## 2023-11-27 NOTE — Progress Notes (Signed)
 @Patient  ID: Yolanda Vargas, female    DOB: 1950-12-12, 73 y.o.   MRN: [id number]  Chief Complaint  Patient presents with   Follow-up    Sleep     Referring provider: Ricky Alfrieda DASEN, DO  HPI: Discussed the use of AI scribe software for clinical note transcription with the patient, who gave verbal consent to proceed.  History of Present Illness Yolanda Vargas is a 73 year old female with severe sleep apnea who presents for follow-up regarding CPAP therapy issues.  She has been experiencing significant issues with her CPAP therapy, which she started after being diagnosed with severe sleep apnea, indicated by an AHI of 30.2 on recent sleep study.  Today we discussed her sleep test results and how things are going with her new CPAP machine.  She reports difficulty with the CPAP mask, including discomfort due to the mask shifting and causing air leaks, despite having the straps tightened as much as possible without causing discomfort. She describes waking up with 'pockets' of air and likens the experience to 'a little mini nightmare'.  She has been experiencing belching and has vomited once, which she attributes to the CPAP use. She notes that the phone app indicates her mask seal is good, even when she experiences these issues. She has tried different masks sent by her sleep coach, which work well initially but seem to lose effectiveness after a few days. She also reports feeling like she is in a 'race' due to the ramp speed increasing during the night, which is uncomfortable when she is awake.  She has a history of insomnia and often wakes up to use the bathroom, after which she sometimes struggles to fall back asleep. She does not currently take any medication for insomnia but has considered trying melatonin again, which she used decades ago. Notably, she is on Zoloft for depression.  She has a history of heart problems, which she believes are related to her sleep apnea. She has had multiple  endoscopies in the past due to stomach issues. She is not overweight and has questioned whether anatomical factors may contribute to her sleep apnea.  Compliance download reviewed with her today; indicates acceptable leak profile at 10.9 and residual AHI of 3.1/hr.  Overall good compliance noted.  Still with fatigue though and feels unrested upon waking.  Last OV 08/15/2023: Yolanda Vargas is a 74 year old female with past medical history of moderate persistent asthma, seasonal allergic rhinitis, hypertension, GERD who presents today for a new consultation for evaluation of sleep.  She follows with [facility] for asthma on a regular basis and was last seen in the office in December 2024.  She reports that she has difficulty sleeping at night.  She does report that she goes to bed between 10 and 11 PM but watches TV while lying in bed.  She states that it only takes her about 10 minutes to fall asleep and she stays in bed for about 8 to 9 hours at night.  She wakes up feeling fatigued and unrested.  She also complains of morning headaches every day.  She has also been told that she snores.  She denies waking up gasping for breath.  She reports that she rarely has caffeine intake especially after 4 PM.  She is currently undergoing workup with cardiology as well.  She did have an ONO completed which demonstrated an O2 sat nadir of 82% for about 13 minutes.  This also demonstrated bradycardic events which prompted the  cardiology workup.  Based on her low oxygen levels overnight she was referred here for sleep evaluation.  She denies chest pain, palpitations, fever, chills, night sweats, weight loss, cough.  She also denies dyspnea on exertion.  She does complain of daily fatigue.    TEST/EVENTS :   CT for cardiac scoring completed on 02/23/2023:IMPRESSION: No significant extracardiac findings within the visualized chest.   PFTs completed on 07/18/2023: These were normal with FEV1 of 86.7% and FVC of  85.3%  09/15/2023 Sleep study: Severe obstructive sleep apnea with moderate oxygen desaturations. AHI is 30.2, O2 nadir of 80%, saturations was below 88% for 21 minutes, 6% of the study.   Allergies  Allergen Reactions   Diltiazem Hcl Other (See Comments)    Stoke like symptoms/more like balance issues, lightheaded, feeling faint     Hydrocodone Palpitations   Penicillins Rash   Doxycycline Rash    Immunization History  Administered Date(s) Administered   Fluzone Influenza virus vaccine,trivalent (IIV3), split virus 12/15/2019, 12/11/2020, 10/16/2021, 10/24/2022   INFLUENZA, HIGH DOSE SEASONAL PF 12/04/2016   Influenza,inj,Quad PF,6+ Mos 09/16/2018   Influenza,inj,quad, With Preservative 09/02/2017   Influenza-Unspecified 10/16/2021   PFIZER(Purple Top)SARS-COV-2 Vaccination 02/08/2019, 03/01/2019, 10/02/2019   Pfizer(Comirnaty)Fall Seasonal Vaccine 12 years and older 10/02/2019, 09/13/2020, 11/14/2021   Pneumococcal Conjugate-13 05/13/2015   Pneumococcal Polysaccharide-23 05/04/2014, 09/13/2020, 10/12/2022   Respiratory Syncytial Virus Vaccine,Recomb Aduvanted(Arexvy) 12/10/2021   Td 06/30/2022   Td,absorbed, Preservative Free, Adult Use, Lf Unspecified 06/30/2022   Tdap 08/09/2009, 06/20/2019   Zoster Recombinant(Shingrix) 12/24/2017, 02/21/2018   Zoster, Live 08/13/2017, 12/24/2017   Zoster, Unspecified 08/13/2017    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   Surgery had to be postponed     Depression    Dyspepsia 03/25/1997   One of my most prominent health issues, diagnosed at this time     Dyspnea on exertion    Excessive vitamin B12 intake 06/02/2021   Lab reports show excessive  Vitamin B12     Family history of bladder cancer    Family history of breast cancer    Family history of heart disease    Family history of ovarian cancer    Fatty (change of) liver, not elsewhere classified 08/30/2015   Female bladder prolapse    Gastritis 03/22/2023   First detected, during EGD     Gastroesophageal reflux disease without esophagitis 12/04/2016   Generalized anxiety disorder 03/05/2017   Genetic testing 05/29/2023   Negative genetic testing on the CancerNext-Expanded+RNAinsight panel.  The report date is May 25, 2023.     The CancerNext-Expanded gene panel offered by [facility] and includes sequencing, rearrangement, and RNA analysis for the following 77 genes: AIP, ALK, APC, ATM, BAP1, BARD1, BMPR1A, BRCA1, BRCA2, BRIP1, CDC73, CDH1, CDK4, CDKN1B, CDKN2A, CEBPA, CHEK2, CTNNA1, DDX41, DICER1, ETV6, FH, FL   GERD (gastroesophageal reflux disease)    Hemochromatosis 01/08/2023   Hereditary hemochromatosis    Hypertension    Hypothyroidism    Insulin resistance    Kidney stone 07/20/2022   Punctuate in size, right kidney     Left groin pain    Lyme disease  around 2009   Major depressive disorder, single episode, in full remission 07/31/2023   Moderate persistent asthma 08/31/2015   Francis     NAFL (nonalcoholic fatty liver)    Osteopenia    Peripheral edema    Raynaud's phenomenon 12/30/2008   Raynaud's phenomenon without gangrene    Recurrent UTI 08/17/2022   Right groin pain    Seasonal allergic rhinitis 02/24/2014   Shortness of breath    Thyroid  goiter    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 10.7 g 1    budesonide -formoterol  (SYMBICORT ) 160-4.5 MCG/ACT inhaler Inhale 2 puffs into the lungs 2 (two) times daily. 10.2 g 6   famotidine  (PEPCID ) 20 MG tablet Take 1 tablet once daily 30 tablet 5   fluticasone  (FLONASE ) 50 MCG/ACT nasal spray Place 1 spray into both nostrils daily. (Patient taking differently: Place 1 spray into both nostrils daily as needed.) 16 g 1   levothyroxine (SYNTHROID) 75 MCG tablet Take 75 mcg by mouth daily before breakfast.     losartan (COZAAR) 25 MG tablet Take 25 mg by mouth daily.     rosuvastatin (CRESTOR) 10 MG tablet Take 10 mg by mouth at bedtime.     sertraline (ZOLOFT) 50 MG tablet Take 75 mg by mouth daily. (Patient taking differently: Take 50 mg by mouth daily.)     No facility-administered medications prior to visit.     Review of Systems:   Constitutional:   No  weight loss, night sweats,  Fevers, chills, fatigue, or  lassitude.  HEENT:   No headaches,  Difficulty swallowing,  Tooth/dental problems, or  Sore throat,                No sneezing, itching, ear ache, nasal congestion, post nasal drip,   CV:  No chest pain,  Orthopnea, PND, swelling in lower extremities, anasarca, dizziness, palpitations, syncope.   GI  No heartburn, indigestion, abdominal pain, nausea, vomiting, diarrhea, change in bowel habits, loss of appetite, bloody stools.   Resp: No shortness of breath with exertion or at rest.  No excess mucus, no productive cough,  No non-productive cough,  No coughing up of blood.  No change in color of mucus.  No wheezing.  No chest wall deformity  Skin: no rash or lesions.  GU: no dysuria, change in color of urine, no urgency or frequency.  No flank pain, no hematuria   MS:  No joint pain or swelling.  No decreased range of motion.  No back pain.    Physical Exam  BP 125/76   Pulse 74   Ht 5' 6 (1.676 m)   Wt 150 lb (68 kg)   SpO2 98%   BMI 24.21 kg/m   GEN: A/Ox3; pleasant , NAD, well nourished    HEENT:  [place]/AT,  EACs-clear,  TMs-wnl, NOSE-clear, THROAT-clear, no lesions, no postnasal drip or exudate noted.   NECK:  Supple w/ fair ROM; no JVD; normal carotid impulses w/o bruits; no thyromegaly or nodules palpated; no lymphadenopathy.    RESP  Clear  P & A; w/o, wheezes/ rales/ or rhonchi. no accessory muscle use, no  dullness to percussion  CARD:  RRR, no m/r/g, no peripheral edema, pulses intact, no cyanosis or clubbing.  GI:   Soft & nt; nml bowel sounds; no organomegaly or masses detected.   Musco: Warm bil, no deformities or joint swelling noted.   Neuro: alert, no focal deficits noted.    Skin: Warm, no lesions or rashes    Lab Results:  CBC    Component Value Date/Time   WBC 5.6 10/18/2023 0847   WBC 5.8 12/06/2022 1144   RBC 4.36 10/18/2023 0847   RBC 4.41 10/18/2023 0847   HGB 14.6 10/18/2023 0847   HGB 13.2 08/13/2023 1405   HCT 43.4 10/18/2023 0847   HCT 39.8 08/13/2023 1405   PLT 211 10/18/2023 0847   PLT 227 12/19/2021 1142   MCV 98.4 10/18/2023 0847   MCV 98 (H) 12/19/2021 1142   MCH 33.1 10/18/2023 0847   MCHC 33.6 10/18/2023 0847   RDW 12.0 10/18/2023 0847   RDW 11.3 (L) 12/19/2021 1142   LYMPHSABS 1.7 10/18/2023 0847   LYMPHSABS 2.0 12/19/2021 1142   MONOABS 0.5 10/18/2023 0847   EOSABS 0.3 10/18/2023 0847   EOSABS 0.6 (H) 12/19/2021 1142   BASOSABS 0.1 10/18/2023 0847   BASOSABS 0.1 12/19/2021 1142    BMET    Component Value Date/Time   NA 141 10/18/2023 0847   NA 142 07/20/2023 1102   K 4.4 10/18/2023 0847   CL 103 10/18/2023 0847   CO2 28 10/18/2023 0847   GLUCOSE 102 (H) 10/18/2023 0847   BUN 18 10/18/2023 0847   BUN 16 07/20/2023 1102   CREATININE 0.71 10/18/2023 0847   CALCIUM 9.9 10/18/2023 0847   GFRNONAA >60 10/18/2023 0847    BNP No results found for: BNP  ProBNP    Component Value Date/Time   PROBNP 59 07/20/2023 1102    Imaging: No results found.  0.9 %  sodium chloride  infusion     Date Action Dose Route User   10/18/2023 1006 New  Bag/Given (none) Intravenous Claudene France, RN          Latest Ref Rng & Units 12/08/2019    9:02 AM 09/06/2016    9:39 AM  PFT Results  FVC-Pre L 3.40    FVC-Predicted Pre % 98    FVC-Post L 3.48    FVC-Predicted Post % 100    Pre FEV1/FVC % % 75    Post FEV1/FCV % % 77    FEV1-Pre L 2.56    FEV1-Predicted Pre % 97    FEV1-Post L 2.66    DLCO uncorrected ml/min/mmHg 22.75    DLCO UNC% % 103    DLCO corrected ml/min/mmHg 22.75    DLCO COR %Predicted % 103    DLVA Predicted % 101    TLC L 5.95  1.74      TLC % Predicted % 106    RV % Predicted % 108       This result is from an external source.    No results found for: NITRICOXIDE   Assessment & Plan:   Assessment & Plan OSA (obstructive sleep apnea)   Assessment & Plan Severe obstructive sleep apnea AHI of 30.2 indicates severe classification. CPAP therapy reduced events to less than 5 per hour but discomfort persists. Discussed anatomical issues and potential Inspire device if CPAP ineffective. Explained risks of untreated sleep apnea. - Continue CPAP therapy with current mask. - Adjusted maximum auto setting to reduce belching (max dec to 13 cm H2O) -  Scheduled in-lab titration study to optimize CPAP settings. - Consider Inspire device if CPAP remains ineffective after trial.  Insomnia Chronic insomnia worsened by CPAP discomfort. No current pharmacological treatment. Discussed non-pharmacological interventions and melatonin use. - Start melatonin at low dose and titrate as needed.   Excessive daytime sleepiness Likely secondary to untreated severe obstructive sleep apnea. Improvement expected with effective CPAP therapy. - Address underlying sleep apnea with CPAP therapy and titration study.    Return in about 3 months (around 02/27/2024) for sleep study review.  Candis Dandy, PA-C 11/27/2023

## 2023-11-27 NOTE — Patient Instructions (Signed)
 Complete titration study; ordered today.  Continue to wear CPAP nightly with goal of at least 4-6 hours.  Follow sleep hygiene as discussed.  Start Melatonin 2.5mg  nightly before bed.  May increase dose as needed based on sleep quality and daytime fatigue.  Would not exceed 10mg  daily.  Autopap max adjusted down to 13 cm H2O today.

## 2023-12-03 DIAGNOSIS — L700 Acne vulgaris: Secondary | ICD-10-CM | POA: Diagnosis not present

## 2023-12-03 DIAGNOSIS — B351 Tinea unguium: Secondary | ICD-10-CM | POA: Diagnosis not present

## 2023-12-03 DIAGNOSIS — D0439 Carcinoma in situ of skin of other parts of face: Secondary | ICD-10-CM | POA: Diagnosis not present

## 2023-12-03 DIAGNOSIS — L300 Nummular dermatitis: Secondary | ICD-10-CM | POA: Diagnosis not present

## 2023-12-11 ENCOUNTER — Encounter (HOSPITAL_BASED_OUTPATIENT_CLINIC_OR_DEPARTMENT_OTHER): Payer: Self-pay

## 2023-12-11 NOTE — Telephone Encounter (Signed)
 FYI

## 2023-12-12 HISTORY — PX: MOHS SURGERY: SHX181

## 2023-12-14 ENCOUNTER — Ambulatory Visit: Admitting: Obstetrics and Gynecology

## 2023-12-17 ENCOUNTER — Telehealth (HOSPITAL_BASED_OUTPATIENT_CLINIC_OR_DEPARTMENT_OTHER): Payer: Self-pay

## 2023-12-17 NOTE — Telephone Encounter (Signed)
 Spoke with pt and instructed her to let the provider know that she is seeing for her compliance visit that she was unable to wear her CPAP during the time period and they should be able to make mention in her OV to document.NFN    Copied from CRM [id number]. Topic: Clinical - Medical Advice >> Dec 14, 2023  2:56 PM Russell PARAS wrote: Reason for CRM:   Patient recently had surgery on her face, to remove skin cancer. She has 18 stitches on her cheek, preventing her from being able to wear the CPAP mask. She will have to go at least another 8-9 days without the mask. She is concerned this will affect the coverage through her insurance for the CPAP machine.  Requested call back  CB#  [phone]

## 2023-12-19 ENCOUNTER — Inpatient Hospital Stay: Admitting: Hematology & Oncology

## 2023-12-19 ENCOUNTER — Inpatient Hospital Stay

## 2023-12-19 ENCOUNTER — Inpatient Hospital Stay: Attending: Oncology

## 2023-12-19 ENCOUNTER — Encounter: Payer: Self-pay | Admitting: Hematology & Oncology

## 2023-12-19 LAB — CMP (CANCER CENTER ONLY)
ALT: 24 U/L (ref 0–44)
AST: 31 U/L (ref 15–41)
Albumin: 4.7 g/dL (ref 3.5–5.0)
Alkaline Phosphatase: 79 U/L (ref 38–126)
Anion gap: 14 (ref 5–15)
BUN: 20 mg/dL (ref 8–23)
CO2: 25 mmol/L (ref 22–32)
Calcium: 10.2 mg/dL (ref 8.9–10.3)
Chloride: 103 mmol/L (ref 98–111)
Creatinine: 0.74 mg/dL (ref 0.44–1.00)
GFR, Estimated: >60 mL/min (ref >60)
Glucose, Bld: 85 mg/dL (ref 70–99)
Potassium: 4.7 mmol/L (ref 3.5–5.1)
Sodium: 141 mmol/L (ref 135–145)
Total Bilirubin: 0.5 mg/dL (ref 0.0–1.2)
Total Protein: 7.6 g/dL (ref 6.5–8.1)

## 2023-12-19 LAB — FERRITIN: Ferritin: 31 ng/mL (ref 11–307)

## 2023-12-19 LAB — CBC WITH DIFFERENTIAL (CANCER CENTER ONLY)
Abs Immature Granulocytes: 0.02 K/uL (ref 0.00–0.07)
Basophils Absolute: 0.1 K/uL (ref 0.0–0.1)
Basophils Relative: 1 %
Eosinophils Absolute: 0.2 K/uL (ref 0.0–0.5)
Eosinophils Relative: 5 %
HCT: 43.6 % (ref 36.0–46.0)
Hemoglobin: 14.4 g/dL (ref 12.0–15.0)
Immature Granulocytes: 0 %
Lymphocytes Relative: 27 %
Lymphs Abs: 1.3 K/uL (ref 0.7–4.0)
MCH: 32.5 pg (ref 26.0–34.0)
MCHC: 33 g/dL (ref 30.0–36.0)
MCV: 98.4 fL (ref 80.0–100.0)
Monocytes Absolute: 0.4 K/uL (ref 0.1–1.0)
Monocytes Relative: 7 %
Neutro Abs: 2.9 K/uL (ref 1.7–7.7)
Neutrophils Relative %: 60 %
Platelet Count: 205 K/uL (ref 150–400)
RBC: 4.43 MIL/uL (ref 3.87–5.11)
RDW: 12.8 % (ref 11.5–15.5)
WBC Count: 4.9 K/uL (ref 4.0–10.5)
nRBC: 0 % (ref 0.0–0.2)

## 2023-12-19 LAB — IRON AND IRON BINDING CAPACITY (CC-WL,HP ONLY)
Iron: 149 ug/dL (ref 28–170)
Saturation Ratios: 42 % — ABNORMAL HIGH (ref 10.4–31.8)
TIBC: 354 ug/dL (ref 250–450)
UIBC: 205 ug/dL

## 2023-12-19 NOTE — Patient Instructions (Signed)

## 2023-12-19 NOTE — Progress Notes (Signed)
 Hematology and Oncology Follow Up Visit  Yolanda Vargas [id number] November 16, 1950 73 y.o. 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  She seems to doing pretty well.  I think she has to see her cardiologist soon.  I know she had a cardiac MRI earlier in the fall.  This was actually done on 10/02/2023.  The result really was unremarkable.  She did not have anything that looked like hemochromatosis.  There is no that suggest amyloidosis.  I know that she is worried about constrictive pericarditis.  This may have been found.  As far as he hemochromatosis concern, her last iron studies that we did back in October showed a ferritin of 45 with an iron saturation of 34%.  She has had no issues with cough.  Has been no fatigue.  She had a nice to Thanksgiving.  There is been no issues with leg swelling.  She has had no rashes.  She has had no weight loss or weight gain.  Overall, I would have to say that her performance status is probably ECOG 1.     Medications:  Current Outpatient Medications:    budesonide -formoterol  (SYMBICORT ) 160-4.5 MCG/ACT inhaler, Inhale 2 puffs into the lungs 2 (two) times daily., Disp: 10.2 g, Rfl: 6   famotidine  (PEPCID ) 20 MG tablet, Take 1 tablet once daily, Disp: 30 tablet, Rfl: 5   fluticasone  (FLONASE ) 50 MCG/ACT nasal spray, Place 1 spray into both nostrils daily., Disp: 16 g, Rfl: 1   levothyroxine (SYNTHROID) 75 MCG tablet, Take 75 mcg by mouth daily before breakfast., Disp: , Rfl:    losartan (COZAAR) 25 MG tablet, Take 25 mg by mouth daily., Disp: , Rfl:    rosuvastatin (CRESTOR) 10 MG tablet, Take 10 mg by mouth at bedtime., Disp: , Rfl:    sertraline (ZOLOFT) 100 MG tablet, Take 100 mg by mouth daily. (Patient taking differently: Take 50 mg by mouth daily.), Disp: , Rfl:     triamcinolone  cream (KENALOG) 0.1 %, Apply topically 2 (two) times daily as needed., Disp: , Rfl:    AIRSUPRA  90-80 MCG/ACT AERO, Inhale 2 puffs into the lungs every 6 (six) hours as needed. (Patient not taking: Reported on 12/19/2023), Disp: 10.7 g, Rfl: 1  Allergies:  Allergies  Allergen Reactions   Diltiazem Hcl Other (See Comments)    Stoke like symptoms/more like balance issues, lightheaded, feeling faint     Hydrocodone Palpitations   Penicillins Rash   Doxycycline Rash    Past Medical History, Surgical history, Social history, and Family History were reviewed and updated.  Review of Systems: Review of Systems  Constitutional: Negative.   HENT:  Negative.    Eyes: Negative.   Respiratory: Negative.    Cardiovascular:  Positive for palpitations.  Gastrointestinal: Negative.   Endocrine: Negative.   Genitourinary: Negative.    Musculoskeletal:  Positive for arthralgias and myalgias.  Skin:  Positive for itching.  Neurological: Negative.   Hematological:  Bruises/bleeds easily.  Psychiatric/Behavioral: Negative.      Physical Exam:  Vital signs show temperature 98.  Pulse 70.  Blood pressure 132/70.  Weight is 152 pounds.     Wt Readings from Last 3 Encounters:  12/19/23 152 lb 0.6 oz (69 kg)  11/27/23 150 lb (68 kg)  10/18/23 147 lb 1.9 oz (66.7  kg)    Physical Exam Vitals reviewed.  HENT:     Head: Normocephalic and atraumatic.  Eyes:     Pupils: Pupils are equal, round, and reactive to light.  Cardiovascular:     Rate and Rhythm: Normal rate and regular rhythm.     Heart sounds: Normal heart sounds.  Pulmonary:     Effort: Pulmonary effort is normal.     Breath sounds: Normal breath sounds.  Abdominal:     General: Bowel sounds are normal.     Palpations: Abdomen is soft.  Musculoskeletal:        General: No tenderness or deformity. Normal range of motion.     Cervical back: Normal range of motion.  Lymphadenopathy:     Cervical: No cervical  adenopathy.  Skin:    General: Skin is warm and dry.     Findings: No erythema or rash.  Neurological:     Mental Status: She is alert and oriented to person, place, and time.  Psychiatric:        Behavior: Behavior normal.        Thought Content: Thought content normal.        Judgment: Judgment normal.      Lab Results  Component Value Date   WBC 4.9 12/19/2023   HGB 14.4 12/19/2023   HCT 43.6 12/19/2023   MCV 98.4 12/19/2023   PLT 205 12/19/2023     Chemistry      Component Value Date/Time   NA 141 12/19/2023 1039   NA 142 07/20/2023 1102   K 4.7 12/19/2023 1039   CL 103 12/19/2023 1039   CO2 25 12/19/2023 1039   BUN 20 12/19/2023 1039   BUN 16 07/20/2023 1102   CREATININE 0.74 12/19/2023 1039      Component Value Date/Time   CALCIUM 10.2 12/19/2023 1039   ALKPHOS 79 12/19/2023 1039   AST 31 12/19/2023 1039   ALT 24 12/19/2023 1039   BILITOT 0.5 12/19/2023 1039      Impression and Plan: Yolanda Vargas is a very charming 73 year old white female.  She has homozygous hemochromatosis.  We have been phlebotomizing her.  I have believe that the hemochromatosis is under good control.  We will see what her iron levels are today.  We will go ahead and phlebotomize her today.  Again, I think this just helps her.  She feels better when she has a phlebotomy.  We will still make follow-up for 2 months.   Maude JONELLE Crease, MD 12/17/202511:54 AM

## 2023-12-19 NOTE — Progress Notes (Signed)
 Yolanda Vargas presents today for phlebotomy per MD orders. Phlebotomy procedure started at 1225 and ended at 1257. 500 grams removed. Patient observed for 30 minutes after procedure without any incident. Patient tolerated procedure well. IV needle removed intact.

## 2023-12-20 ENCOUNTER — Ambulatory Visit: Payer: Self-pay | Admitting: Hematology & Oncology

## 2023-12-20 NOTE — Telephone Encounter (Signed)
 Advised via MyChart.

## 2023-12-20 NOTE — Telephone Encounter (Signed)
-----   Message from Maude Crease, MD sent at 12/20/2023  6:53 AM EST ----- Please call and let her know that the iron levels are still quite good.  Great job.  Have a very merry Christmas.  Thanks.  Jeralyn

## 2024-01-03 ENCOUNTER — Encounter (HOSPITAL_BASED_OUTPATIENT_CLINIC_OR_DEPARTMENT_OTHER): Payer: Self-pay

## 2024-01-04 NOTE — Telephone Encounter (Signed)
 Please advise

## 2024-01-08 ENCOUNTER — Ambulatory Visit (INDEPENDENT_AMBULATORY_CARE_PROVIDER_SITE_OTHER)

## 2024-01-08 ENCOUNTER — Encounter (INDEPENDENT_AMBULATORY_CARE_PROVIDER_SITE_OTHER): Payer: Self-pay

## 2024-01-08 VITALS — BP 108/73 | HR 77 | Wt 148.0 lb

## 2024-01-08 DIAGNOSIS — J342 Deviated nasal septum: Secondary | ICD-10-CM | POA: Diagnosis not present

## 2024-01-08 DIAGNOSIS — Z8589 Personal history of malignant neoplasm of other organs and systems: Secondary | ICD-10-CM

## 2024-01-08 DIAGNOSIS — M950 Acquired deformity of nose: Secondary | ICD-10-CM

## 2024-01-08 DIAGNOSIS — G4733 Obstructive sleep apnea (adult) (pediatric): Secondary | ICD-10-CM

## 2024-01-08 DIAGNOSIS — R040 Epistaxis: Secondary | ICD-10-CM | POA: Diagnosis not present

## 2024-01-08 MED ORDER — MUPIROCIN 2 % EX OINT: 1.0000 | TOPICAL_OINTMENT | Freq: Two times a day (BID) | CUTANEOUS | 0 refills | Status: AC

## 2024-01-08 MED ORDER — SALINE SPRAY 0.65 % NA SOLN: 1.0000 | Freq: Four times a day (QID) | NASAL | 0 refills | Status: AC

## 2024-01-08 NOTE — Patient Instructions (Signed)
 " VISIT SUMMARY: During your visit, we discussed your recurrent nosebleeds, nasal congestion, headaches, post-surgical scar care, and obstructive sleep apnea management. We reviewed your symptoms and developed a plan to address each issue.  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 We will manage this conservatively with a topical antibiotic ointment twice daily for five days, nasal saline spray and gel for moisture, and the use of a home humidifier. Avoid using intranasal fluticasone . For future nosebleeds, you can use antibiotic ointment or petrolatum, especially when using CPAP. If needed, we can perform in-office cautery. If your congestion persists or worsens, we may consider septoplasty. Please return if your nosebleeds continue or worsen.  -POST-SURGICAL SCAR CARE OF NASAL SKIN: Your surgical site from the Mohs surgery is healing well, but it is currently red due to irritation from polysporin. We expect the firmness under the scar to resolve over time. You should stop using polysporin and apply Aquaphor once or twice daily. You can also use silicone-based scar gels or creams and perform gentle massage. Silicone strips can be used at night. If you use CPAP, protect the area with non-stick gauze. Monitor for any signs of redness, swelling, or infection, and pause CPAP if these occur.  -OBSTRUCTIVE SLEEP APNEA: You have severe obstructive sleep apnea, which causes your oxygen levels to drop at night. Using CPAP is critical for managing your condition, but it has been difficult post-surgery due to mask pressure on your scar. You should resume CPAP with protective gauze over the surgical site. If the area becomes inflamed, pause CPAP. If CPAP becomes intolerable, we can consider surgical options. We may also consider septoplasty if nasal obstruction  impacts your CPAP use. Follow up with pulmonology and monitor for poor sleep or low oxygen levels.  INSTRUCTIONS: Please follow up with pulmonology and return if your nosebleeds persist or worsen. Monitor your surgical site for any signs of redness, swelling, or infection, and pause CPAP if these occur.   Epistaxis nosebleed is common and usually not dangerous. Most nosebleeds can be managed at home but some may need medical attention.      **How to stop an active nosebleed**   - Sit up and lean forward slightly. This keeps blood from going down the throat, which can cause nausea or vomiting.   - Pinch the soft part of the nose just below the bony bridge firmly for 15 to 20 minutes without letting go. Breathe through the mouth and spit out any blood.   - A nasal clamp (nose clip) can be used to apply pressure to the lower third of the nose. Finger pressure is usually enough.   - For active bleeding, use Afrin (oxymetazoline) nasal spray in the bleeding nostril before applying pressure.   - If bleeding does not stop after 20 minutes, or if the bleeding is heavy, seek medical care.      **If nasal tampons or packing were placed**   - Nonresorbable packing (not dissolvable) must be removed by a healthcare provider after 48 to 72 hours.   - Resorbable packing (dissolvable) may need follow-up within 1 week to check for any remaining material.   - Keep the nose and packing moist with frequent nasal saline spray to help healing and reduce crusting.   - Do not blow the nose, pick at the packing, or try to remove it yourself.   -  If packing is secured with a clip at the nostril, be careful to avoid pressure sores on the skin.      **How to prevent future nosebleeds**   - Do not pick the nose or put tissues in the nose except for gentle dabbing.   - Avoid blowing the nose forcefully for at least 1 week after a nosebleed or packing.   - Use nasal saline spray every 4 to 6 hours as needed to keep the  inside of the nose moist.   - Apply over-the-counter nasal saline gel (such as Ayr Gel) to the nostrils (use a pea-sized amount on a pinkie finger and put it on the nostril, not the middle part of the septum), then gently pinch the nostril. Do this at least 3 times per day, but it can be done more often.   - Use a humidifier in the bedroom at night to add moisture to the air.   - Maintain tight blood pressure control, as high blood pressure increases the risk of nosebleeds.   - If taking blood thinners or aspirin, discuss with the healthcare provider whether these should be adjusted.      **Other tips**   - Use acetaminophen (Tylenol) for pain. Avoid aspirin and ibuprofen unless advised by a healthcare provider, as they can increase bleeding risk.   - If sneezing, try to sneeze with the mouth open to reduce pressure in the nose.   - If using oxygen or CPAP, make sure the device is humidified.      **When to seek medical attention**   - Bleeding does not stop after 20 minutes of firm pressure.   - Bleeding is very heavy or goes down the throat.   - Difficulty breathing, fainting, or signs of shock (pale, weak, confused).   - If packing falls out or causes severe pain, fever, or foul smell.   - Repeated nosebleeds, especially if on blood thinners or with other health problems.      **Follow-up**   - Attend all scheduled follow-up appointments for packing removal and to check healing.     - If nosebleeds keep happening, further tests may be needed to look for other causes.      Most nosebleeds can be managed at home with pressure, nasal sprays, and good nasal hygiene. Preventing dryness and trauma to the nose, controlling blood pressure, and following instructions for packing are key to reducing future episodes.                      Contains text generated by [employer].                                 Contains text generated by [employer].   "

## 2024-01-08 NOTE — Progress Notes (Signed)
 Dear Dr. Ricky, Here is my assessment for our mutual patient, Yolanda Vargas. Thank you for allowing me the opportunity to care for your patient. Please do not hesitate to contact me should you have any other questions. Sincerely, Dr. Penne Croak  Otolaryngology Clinic Note Referring provider: Dr. Ricky HPI:  Discussed the use of AI scribe software for clinical note transcription with the patient, who gave verbal consent to proceed.  History of Present Illness Yolanda Vargas is a 74 year old female with severe obstructive sleep apnea and recent Mohs surgery of the nose who presents for evaluation of recurrent epistaxis.  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  Nasal congestion and rhinorrhea - Chronic nasal congestion, predominantly left-sided and longstanding - Minimal mucus production until recently - New onset of rhinorrhea since starting CPAP therapy  Headache and sleep disturbance - Daily headaches attributed to poor sleep and lack of CPAP use - Poor sleep quality with frequent nocturia and prolonged awakenings  Post-surgical nasal scar and wound care - 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, with closure by Mohs surgeon - Surgical site recently became erythematous after application of Polysporin ointment, which has been discontinued - Presence of a hard area beneath the scar - Current use of silicone gel and Aquaphor for scar management - Concern about direct pressure from CPAP mask on the scar - No CPAP use since procedure due to discomfort and concern for healing - Inquiry regarding timing for resuming CPAP and scar massage - Attempts to use non-stick  gauze to protect the area  Obstructive sleep apnea and cpap therapy - Obstructive sleep apnea with nocturnal oxygen desaturations as low as 80% - Received CPAP machine on October 30th; initial difficulty adjusting to mask, but able to use prior to surgery - No CPAP use since December 10th due to surgical site - CPAP use improved nasal humidity but resulted in frequent rhinorrhea - Considering surgical options for sleep apnea if CPAP becomes intolerable - 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  Nasal anatomy and trauma history - History of childhood nasal trauma, including baseball injury - Progressive deviation of nasal bridge - Describes nose as 'floppy' and ears as similarly soft  Nasal and airway management - Avoids intranasal corticosteroids due to concern for worsening dryness - Recently started using humidifier and saline sprays - Uses full-face CPAP mask and has not found a suitable alternative  Tobacco use - Does not smoke   Independent Review of Additional Tests or Records:  Reviewed external note from referring PCP, Tu,describing relevant history incorporated into todays evaluation.   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   Surgery had to be postponed     Depression    Dyspepsia 03/25/1997   One of my most prominent health issues, diagnosed at this time     Dyspnea  on exertion    Excessive vitamin B12 intake 06/02/2021   Lab reports show excessive Vitamin B12     Family history of bladder cancer    Family history of breast cancer    Family history of heart disease    Family history of ovarian cancer    Fatty (change of) liver, not  elsewhere classified 08/30/2015   Female bladder prolapse    Gastritis 03/22/2023   First detected, during EGD     Gastroesophageal reflux disease without esophagitis 12/04/2016   Generalized anxiety disorder 03/05/2017   Genetic testing 05/29/2023   Negative genetic testing on the CancerNext-Expanded+RNAinsight panel.  The report date is May 25, 2023.     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   Hereditary hemochromatosis    Hypertension    Hypothyroidism    Insulin resistance    Kidney stone 07/20/2022   Punctuate in size, right kidney     Left groin pain    Lyme disease    around 2009   Major depressive disorder, single episode, in full remission 07/31/2023   Moderate persistent asthma 08/31/2015   Francis     NAFL (nonalcoholic fatty liver)    OSA on CPAP    Osteopenia    Peripheral edema    Raynaud's phenomenon 12/30/2008   Raynaud's phenomenon without gangrene    Recurrent UTI 08/17/2022   Right groin pain    Seasonal allergic rhinitis 02/24/2014   Shortness of breath    Thyroid  goiter    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     Vaginal prolapse      Past Surgical History:  Procedure Laterality Date   BREAST BIOPSY Left    2018   CERVICAL CONE BIOPSY     CHOLECYSTECTOMY     COLONOSCOPY     ESOPHAGOGASTRODUODENOSCOPY     KNEE SURGERY     Bursa removed   LAPAROSCOPIC HYSTERECTOMY  2020   MOHS SURGERY Left 12/12/2023   left side of face    Family History  Problem Relation Age of Onset   Breast cancer Mother 32       bilateral   Diabetes Father    Heart attack Father    Coronary artery disease Father    Ovarian  cancer Sister 36   Bladder Cancer Sister 65   Heart disease Brother    Hypertension Brother    Cancer Maternal Aunt        Eye   Esophageal cancer Maternal Aunt    Polycythemia Maternal Uncle    Lung cancer Paternal Aunt 52   Pneumonia Maternal Grandmother    Congestive Heart Failure Paternal Grandfather    Cancer Cousin 73       unknown     Social Connections: Unknown (05/17/2021)   Received from [employer]   Social Network    Social Network: Not on file     Current Medications[1]   Physical Exam:   BP 108/73 (BP Location: Left Arm, Patient Position: Sitting, Cuff Size: Normal)   Pulse 77   Wt 148 lb (67.1 kg)   SpO2 98%   BMI 23.89 kg/m   The patient was awake, alert, and appropriate. The external ears  were inspected, and otoscopy was performed to evaluate the external auditory canals and tympanic membranes. The nasal cavity and septum were examined for mucosal changes, obstruction, or discharge. The oral cavity and oropharynx were inspected for mucosal lesions, infection, or tonsillar hypertrophy. The neck was palpated for lymphadenopathy, thyroid  abnormalities, or other masses. Cranial nerve function was grossly intact.  Pertinent Findings: General: Well developed, well nourished. No acute distress. Voice without hoarseness Head/Face: Normocephalic. No sinus tenderness. Facial nerve intact and equal bilaterally. No facial lacerations. Eyes: PERRL, no scleral icterus or conjunctival hemorrhage. EOMI. Ears: No gross deformity. Normal external canal. Tympanic membrane with normal landmarks bilaterally Hearing: Normal speech reception.  Nose: No purulent discharge. mild turbinate hypertrophy.  Mouth/Oropharynx: Lips without any lesions. Dentition good. No mucosal lesions within the oropharynx. No tonsillar enlargement, exudate, or lesions. Pharyngeal walls symmetrical. Uvula midline. Tongue midline without lesions. Larynx: See TFL if applicable Nasopharynx: See TFL if  applicable Neck: Trachea midline. No masses. No thyromegaly or nodules palpated. No crepitus. Lymphatic: No lymphadenopathy in the neck. Respiratory: No stridor or distress. Room air. Cardiovascular: Regular rate and rhythm. Extremities: No edema or cyanosis. Warm and well-perfused. Skin: No scars or lesions on face or neck. Neurologic: CN II-XII grossly intact. Moving all extremities without gross abnormality. Other:  Physical Exam HEENT: Nose with deviated septum on the left and right sides, with raw spot and inflamed tissue. No nasal cancer. Right nasal passage clear. Back of nose normal.  NECK: Neck normal.  Seprately Identifiable Procedures:  I personally ordered, reviewed and interpreted the following with the patient today  Electronically signed by: Penne Croak, DO 01/08/2024 9:50 PM Given the patient's symptoms and incomplete visualization of critical sinonasal areas with anterior rhinoscopy, a separately performed diagnostic nasal endoscopy procedure is indicated for a complete rhinologic evaluation per American Rhinologic Society recommendations (https://[email or web address]-rhinologic.org/position-statements)  I personally ordered, reviewed and interpreted the following with the patient today  Procedure Note Diagnostic Nasal Endoscopy CPT CODE -- 68768 - Mod 25  Prior to initiating any procedures, risks/benefits/alternatives were explained to the patient and verbal consent obtained.  Pre-procedure diagnosis: Concern for obstruction and sinusitis Post-procedure diagnosis: same Indication: See pre-procedure diagnosis and physical exam above Complications: None apparent EBL: 0 mL Anesthesia: Lidocaine 4% and topical decongestant was topically sprayed in each nasal cavity  Description of Procedure:  Patient was identified. A flexible fiberoptic endoscope was utilized to evaluate the sinonasal cavities, mucosa, sinus ostia and turbinates and septum.  Overall, signs of mucosal  inflammation are noted.  Also noted are inflamation left anterior septum.  No mucopurulence, polyps, or masses noted.   Right Middle meatus: clear Right SE Recess: clear Left MM: clear Left SE Recess: clear Photodocumentation was obtained.   Impression & Plans:  Makyla Bye is a 74 y.o. female  1. Epistaxis   2. OSA (obstructive sleep apnea)   3. History of squamous cell carcinoma   4. Acquired nasal deformity   5. DNS (deviated nasal septum)    - Findings and diagnoses discussed in detail with the patient. - Risks, benefits, and alternatives were reviewed. Through shared decision making, the patient elects to proceed with below. Assessment & Plan Epistaxis secondary to deviated nasal septum Recurrent epistaxis linked to deviated septum and nasal dryness, worsened by CPAP. No malignancy on endoscopy. Conservative management preferred due to age and anesthesia risks. - Prescribed topical antibiotic ointment twice daily for five days. - Advised nasal saline spray and gel for moisture. - Recommended home humidifier use. -  Instructed to avoid intranasal fluticasone . - Advised antibiotic ointment or petrolatum for future epistaxis, especially with CPAP. - Provided guidance on in-office cautery if needed. - Discussed septoplasty if congestion persists or worsens. - Advised return if epistaxis persists or worsens.  Post-surgical scar care of nasal skin Post-Mohs surgery site well epithelialized, erythematous due to polysporin irritation. Sub-scar firmness expected to resolve. CPAP may cause scar pressure. - Advised discontinuation of polysporin. - Recommended Aquaphor application once or twice daily. - Approved silicone-based scar gels or creams and gentle massage. - Recommended silicone strips at night. - Discussed non-stick gauze use during CPAP. - Advised monitoring for erythema, swelling, or infection and to pause CPAP if these occur.  Obstructive sleep apnea Severe OSA with  nocturnal desaturations. CPAP critical but difficult post-surgery due to mask pressure. Narrow palate may contribute. Surgical options if CPAP intolerable. - Advised CPAP resumption with protective gauze over surgical site. - Emphasized CPAP's critical role for health and OSA management. - Provided guidance to pause CPAP if surgical site inflamed. - Discussed drug-induced sleep endoscopy for surgical candidacy if CPAP intolerable. - Reinforced septoplasty consideration if nasal obstruction impacts CPAP. - Advised follow-up with pulmonology and monitoring for poor sleep or hypoxemia.  - Orders placed: No orders of the defined types were placed in this encounter.  - Medications prescribed/continued/adjusted:  Meds ordered this encounter  Medications   mupirocin  ointment (BACTROBAN ) 2 %    Sig: Apply 1 Application topically 2 (two) times daily. Apply small amount inside nose    Dispense:  22 g    Refill:  0   sodium chloride  (OCEAN) 0.65 % SOLN nasal spray    Sig: Place 1 spray into both nostrils 4 (four) times daily.    Dispense:  135 mL    Refill:  0   - Education materials provided to the patient. - Follow up: PRN epistaxis. Patient instructed to return sooner or go to the ED if new/worsening symptoms develop.  Thank you for allowing me the opportunity to care for your patient. Please do not hesitate to contact me should you have any other questions.  Sincerely, Penne Croak, DO Otolaryngologist (ENT) [facility] Phone: [phone] Fax: [phone]  01/08/2024, 9:50 PM    I spent 49 minutes (exclusive of separately billable procedures) on the day of the encounter reviewing the patient's chart, seeing the patient face to face, discussing the findings and the treatment plan, and documenting in the EHR.         [1]  Current Outpatient Medications:    budesonide -formoterol  (SYMBICORT ) 160-4.5 MCG/ACT inhaler, Inhale 2 puffs into the lungs 2 (two) times  daily., Disp: 10.2 g, Rfl: 6   famotidine  (PEPCID ) 20 MG tablet, Take 1 tablet once daily, Disp: 30 tablet, Rfl: 5   fluticasone  (FLONASE ) 50 MCG/ACT nasal spray, Place 1 spray into both nostrils daily., Disp: 16 g, Rfl: 1   levothyroxine (SYNTHROID) 75 MCG tablet, Take 75 mcg by mouth daily before breakfast., Disp: , Rfl:    losartan (COZAAR) 25 MG tablet, Take 25 mg by mouth daily., Disp: , Rfl:    mupirocin  ointment (BACTROBAN ) 2 %, Apply 1 Application topically 2 (two) times daily. Apply small amount inside nose, Disp: 22 g, Rfl: 0   rosuvastatin (CRESTOR) 10 MG tablet, Take 10 mg by mouth at bedtime., Disp: , Rfl:    sertraline (ZOLOFT) 100 MG tablet, Take 100 mg by mouth daily. (Patient taking differently: Take 50 mg by mouth daily.), Disp: , Rfl:  sodium chloride  (OCEAN) 0.65 % SOLN nasal spray, Place 1 spray into both nostrils 4 (four) times daily., Disp: 135 mL, Rfl: 0   triamcinolone  cream (KENALOG) 0.1 %, Apply topically 2 (two) times daily as needed., Disp: , Rfl:    AIRSUPRA  90-80 MCG/ACT AERO, Inhale 2 puffs into the lungs every 6 (six) hours as needed. (Patient not taking: Reported on 01/08/2024), Disp: 10.7 g, Rfl: 1

## 2024-01-11 ENCOUNTER — Other Ambulatory Visit: Payer: Self-pay | Admitting: Allergy and Immunology

## 2024-01-11 ENCOUNTER — Ambulatory Visit: Admitting: Pulmonary Disease

## 2024-01-11 ENCOUNTER — Encounter: Payer: Self-pay | Admitting: Pulmonary Disease

## 2024-01-11 VITALS — BP 124/81 | HR 77 | Ht 66.0 in | Wt 152.6 lb

## 2024-01-11 DIAGNOSIS — G4733 Obstructive sleep apnea (adult) (pediatric): Secondary | ICD-10-CM

## 2024-01-11 DIAGNOSIS — G4700 Insomnia, unspecified: Secondary | ICD-10-CM

## 2024-01-11 MED ORDER — ESZOPICLONE 2 MG PO TABS: 2.0000 mg | ORAL_TABLET | Freq: Every evening | ORAL | 1 refills | Status: AC | PRN

## 2024-01-11 NOTE — Progress Notes (Signed)
 "              Yolanda Vargas    [id number]    1950-06-13  Primary Care Physician:Tu, Alfrieda DASEN, DO  Referring Physician: Ricky Alfrieda DASEN, DO [address] [place],  [place] [address]  Chief complaint:   Patient is being seen insomnia, past history of obstructive sleep apnea, has been off CPAP because of recent Mohs surgery in December Some difficulty with the mask Concerned about ongoing cardiac evaluation and heart disease  Discussed the use of AI scribe software for clinical note transcription with the patient, who gave verbal consent to proceed.  History of Present Illness Yolanda Vargas is a 74 year old female with sleep apnea who presents with difficulties using CPAP and insomnia.  She has been experiencing difficulties with her CPAP treatment, which she started at the end of October. Initially, she had trouble adjusting to the mask, experiencing leakage and gas, which led to vomiting one night. These issues were beginning to settle down, but she has not been using the CPAP recently due to Mohs surgery a month ago, as the mask puts pressure on the surgical site.  She experiences bouts of insomnia, characterized by waking up in the early morning and staying awake for two to four hours. Sometimes she does not fall back asleep, and when she does, it is restless. She does not have trouble falling asleep initially but wakes up to use the bathroom or wakes up 'like clockwork.'  She has been on Zoloft since spring, having switched from Lexapro, which made her feel 'much less affect.' Zoloft was effective in the past but seemed to lose its effectiveness over time. She had a major depressive episode in 1990, attributed to anxiety and lack of sleep due to personal tragedies and work stress. She describes her anxiety as not being a 'worrier' but having a nervous system that is 'just not.'  She has a history of supraventricular tachycardia and a decrease in ejection fraction from 55% to 47%  in the right ventricle. 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 She is concerned about the relationship between her heart condition and sleep apnea, noting that untreated sleep apnea could stress the heart by not allowing it to rest at night. She has experienced dyspnea and bradycardia at night, which may be associated with sleep apnea.  Known to have severe obstructive sleep apnea with AHI of 30.2, when she was using CPAP even though she had mask issues, comfort issues, she was using it regularly.  Has been off the CPAP now since she had Mohs surgery wondering when she should go back to using it  Continues to follow-up with cardiology on a regular basis  Compliant with inhalers  Outpatient Encounter Medications as of 01/11/2024  Medication Sig   budesonide -formoterol  (SYMBICORT ) 160-4.5 MCG/ACT inhaler Inhale 2 puffs into the lungs 2 (two) times daily.   famotidine  (PEPCID ) 20 MG tablet TAKE 1 TABLET BY MOUTH EVERY DAY   fluticasone  (FLONASE ) 50 MCG/ACT nasal spray Place 1 spray into both nostrils daily.   levothyroxine (SYNTHROID) 75 MCG tablet Take 75 mcg by mouth daily before breakfast.   losartan (COZAAR) 25 MG tablet Take 25 mg by mouth daily.   mupirocin  ointment (BACTROBAN ) 2 % Apply 1 Application topically 2 (two) times daily. Apply small amount inside nose   rosuvastatin (CRESTOR) 10 MG tablet Take 10 mg by mouth at bedtime.  sertraline (ZOLOFT) 100 MG tablet Take 100 mg by mouth daily. (Patient taking differently: Take 50 mg by mouth daily.)   sodium chloride  (OCEAN) 0.65 % SOLN nasal spray Place 1 spray into both nostrils 4 (four) times daily.   triamcinolone  cream (KENALOG) 0.1 % Apply topically 2 (two) times daily as needed.   AIRSUPRA  90-80 MCG/ACT AERO Inhale 2 puffs into the lungs every 6 (six) hours as needed. (Patient not taking: Reported on 01/08/2024)   [DISCONTINUED] famotidine  (PEPCID ) 20 MG tablet Take  1 tablet once daily   No facility-administered encounter medications on file as of 01/11/2024.    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   Surgery had to be postponed     Depression    Dyspepsia 03/25/1997   One of my most prominent health issues, diagnosed at this time     Dyspnea on exertion    Excessive vitamin B12 intake 06/02/2021   Lab reports show excessive Vitamin B12     Family history of bladder cancer    Family history of breast cancer    Family history of heart disease    Family history of ovarian cancer    Fatty (change of) liver, not elsewhere classified 08/30/2015   Female bladder prolapse    Gastritis 03/22/2023   First detected, during EGD     Gastroesophageal reflux disease without esophagitis 12/04/2016   Generalized anxiety disorder 03/05/2017   Genetic testing 05/29/2023   Negative genetic testing on the CancerNext-Expanded+RNAinsight panel.  The report date is May 25, 2023.     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    Hypertension    Hypothyroidism    Insulin resistance    Kidney stone 07/20/2022    Punctuate in size, right kidney     Left groin pain    Lyme disease    around 2009   Major depressive disorder, single episode, in full remission 07/31/2023   Moderate persistent asthma 08/31/2015   Francis     NAFL (nonalcoholic fatty liver)    OSA on CPAP    Osteopenia    Peripheral edema    Raynaud's phenomenon 12/30/2008   Raynaud's phenomenon without gangrene    Recurrent UTI 08/17/2022   Right groin pain    Seasonal allergic rhinitis 02/24/2014   Shortness of breath    Thyroid  goiter    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     Vaginal prolapse     Past Surgical History:  Procedure Laterality Date   BREAST BIOPSY Left    2018   CERVICAL CONE BIOPSY     CHOLECYSTECTOMY  COLONOSCOPY     ESOPHAGOGASTRODUODENOSCOPY     KNEE SURGERY     Bursa removed   LAPAROSCOPIC HYSTERECTOMY  2020   MOHS SURGERY Left 12/12/2023   left side of face    Family History  Problem Relation Age of Onset   Breast cancer Mother 35       bilateral   Diabetes Father    Heart attack Father    Coronary artery disease Father    Ovarian cancer Sister 45   Bladder Cancer Sister 61   Heart disease Brother    Hypertension Brother    Cancer Maternal Aunt        Eye   Esophageal cancer Maternal Aunt    Polycythemia Maternal Uncle    Lung cancer Paternal Aunt 19   Pneumonia Maternal Grandmother    Congestive Heart Failure Paternal Grandfather    Cancer Cousin 67       unknown    Social History   Socioeconomic History   Marital status: Divorced    Spouse name: Not on file   Number of children: 3   Years of education: 12+6+60 hours   Highest education level: Manufacturing engineer (e.g., MA, MS, MEng, MEd, MSW, MBA)  Occupational History    Comment: RETIRED  Tobacco Use   Smoking status: Never   Smokeless tobacco: Never  Vaping Use   Vaping status: Never Used  Substance and Sexual Activity   Alcohol use: Not Currently   Drug use: Never   Sexual activity:  Not Currently    Partners: Male    Birth control/protection: Surgical    Comment: first sexual encounter at 89, less than 5, hysterectomy  Other Topics Concern   Not on file  Social History Narrative   Are you right handed or left handed? right   Are you currently employed ?    What is your current occupation? retire   Do you live at home alone?   Who lives with you? Mother in law sweet    What type of home do you live in: 1 story or 2 story? one    Caffiene rarely   Social Drivers of Health   Tobacco Use: Low Risk (01/11/2024)   Patient History    Smoking Tobacco Use: Never    Smokeless Tobacco Use: Never    Passive Exposure: Not on file  Financial Resource Strain: Not on file  Food Insecurity: No Food Insecurity (06/22/2023)   Epic    Worried About Programme Researcher, Broadcasting/film/video in the Last Year: Never true    Ran Out of Food in the Last Year: Never true  Transportation Needs: No Transportation Needs (06/22/2023)   Epic    Lack of Transportation (Medical): No    Lack of Transportation (Non-Medical): No  Physical Activity: Not on file  Stress: Not on file  Social Connections: Unknown (05/17/2021)   Received from [facility]   Social Network    Social Network: Not on file  Intimate Partner Violence: Not At Risk (06/22/2023)   Epic    Fear of Current or Ex-Partner: No    Emotionally Abused: No    Physically Abused: No    Sexually Abused: No  Depression (PHQ2-9): Low Risk (12/19/2023)   Depression (PHQ2-9)    PHQ-2 Score: 0  Alcohol Screen: Low Risk (12/20/2022)   Alcohol Screen    Last Alcohol Screening Score (AUDIT): 0  Housing: Low Risk (06/22/2023)   Epic    Unable to Pay for Housing in the  Last Year: No    Number of Times Moved in the Last Year: 0    Homeless in the Last Year: No  Utilities: Not At Risk (06/22/2023)   Epic    Threatened with loss of utilities: No  Health Literacy: Not on file    Review of Systems  Respiratory:  Positive for apnea.    Psychiatric/Behavioral:  Positive for sleep disturbance.     There were no vitals filed for this visit.   Physical Exam Constitutional:      Appearance: Normal appearance.  HENT:     Head: Normocephalic.     Mouth/Throat:     Mouth: Mucous membranes are moist.  Eyes:     General: No scleral icterus. Cardiovascular:     Rate and Rhythm: Normal rate and regular rhythm.     Heart sounds: No murmur heard.    No friction rub.  Pulmonary:     Effort: No respiratory distress.     Breath sounds: No stridor. No wheezing or rhonchi.  Musculoskeletal:     Cervical back: No rigidity or tenderness.  Skin:    Coloration: Skin is not jaundiced.  Neurological:     Mental Status: She is alert.  Psychiatric:        Mood and Affect: Mood normal.    Data Reviewed: Pulmonary function test 12/08/2019 shows no obstruction, no restriction, normal diffusing capacity  Sleep study shows severe obstructive sleep apnea with AHI of 30.2  Compliance data during the last visit showed 12/12/23 AHI of 3.1, pressures reduced to maximum pressure of 13  Assessment and Plan Assessment & Plan Obstructive sleep apnea CPAP therapy was initiated, but usage has been inconsistent due to initial adjustment difficulties and recent Mohs surgery. Initial adjustment difficulties with mask fit and leakage have improved. CPAP usage is inconsistent, with usage between 5 to 13 hours per night. Apnea events reduced from 30 to 2.5 per hour, indicating effective treatment. Recent Mohs surgery has delayed CPAP use due to mask pressure on surgical site. No direct link between sleep apnea and cardiac issues, but untreated sleep apnea can exacerbate heart disease risk. - Ordered nasal pillow mask for CPAP to reduce pressure on surgical site. - Advised to consult with surgeon regarding safe resumption of CPAP use post-surgery if she has to go back to using a fullface mask. - Encouraged consistent CPAP use once cleared by surgeon  with a fullface mask, she can safely try nasal pillows at present that does not put pressure on the surgical side.  Insomnia Characterized by difficulty maintaining sleep, with frequent awakenings and restlessness. Zoloft may contribute to insomnia. Discussed potential switch to mirtazapine for its sedative properties. Non-pharmacological options like cognitive behavioral therapy for insomnia (CBTI) recommended as first-line treatment. Pharmacological options include long-acting Ambien, Lunesta , dual orexin inhibitors, and trazodone, with varying efficacy and potential side effects such as risk of dementia and brain fog. Emphasized non-habit forming nature of sleep aids and strategies to manage potential dependency. - Provided information on cognitive behavioral therapy for insomnia. - Discuss potential switch to mirtazapine with prescribing physician. - Consider trial of long-acting Ambien or Lunesta  for sleep maintenance. - Discussed potential use of trazodone for sleep maintenance. - Prescription for Lunesta  will be sent to pharmacy to try at 2 mg  Aftercare following skin surgery Recent Mohs surgery has delayed CPAP use due to mask pressure on surgical site. Advised to consult with surgeon regarding safe resumption of CPAP use post-surgery. No specific data on mask  type affecting surgical site healing, but nasal pillow mask may reduce pressure. - Consult with surgeon regarding safe resumption of CPAP use post-surgery. - Ordered nasal pillow mask for CPAP to reduce pressure on surgical site.  Continue graded activities as tolerated  Continue current inhalers  Follow-up in about 6 to 8 weeks to assess for effectiveness and compliance with CPAP therapyOkay will you ask me about the lady that we referring to pulmonary rehab unit with [facility] 3 COPD yeah effectiveness and compliance with CPAP therapy  I spent 32 minutes dedicated to the care of this patient on the date of this encounter to  include previsit review of records, face-to-face time with the patient discussing conditions above, post visit ordering of testing,ordering medications,independentlyinterpreting results, clinical documentation with electronic health record.  Jennet Epley MD [facility] 01/11/2024, 9:02 AM  CC: Ricky Pax T, DO   "

## 2024-01-11 NOTE — Patient Instructions (Signed)
 Regarding insomnia - Trial with Lunesta  -Cognitive behavioral therapy  Nasal mask for sleep apnea -Still discussed with the surgeon regarding when he can go back to a fullface mask in case the nasal mask does not help as much  Follow-up in 6 to 8 weeks -Hopefully you have your back on CPAP we can assess how CPAP is still helping and also the sleep aid  If you do not see any benefit from a sleep aid all it is making you too sleepy you can back off on the dose or if we need to escalate to a higher dose or change the medication  Call with significant concerns  Download from your machine over time definitely shows that CPAP does help with sleep disordered breathing

## 2024-02-11 ENCOUNTER — Ambulatory Visit: Admitting: Internal Medicine

## 2024-02-11 ENCOUNTER — Ambulatory Visit: Admitting: Obstetrics and Gynecology

## 2024-02-20 ENCOUNTER — Inpatient Hospital Stay: Admitting: Hematology & Oncology

## 2024-02-20 ENCOUNTER — Inpatient Hospital Stay

## 2024-02-20 ENCOUNTER — Inpatient Hospital Stay: Attending: Oncology

## 2024-02-27 ENCOUNTER — Ambulatory Visit (HOSPITAL_BASED_OUTPATIENT_CLINIC_OR_DEPARTMENT_OTHER)

## 2024-03-18 ENCOUNTER — Ambulatory Visit: Admitting: Pulmonary Disease

## 2024-04-14 ENCOUNTER — Ambulatory Visit (INDEPENDENT_AMBULATORY_CARE_PROVIDER_SITE_OTHER)
